# Patient Record
Sex: Male | Born: 1950
Health system: Southern US, Community
[De-identification: ages and names within clinical notes are randomized; demographics above are authoritative.]

## PROBLEM LIST (undated history)

## (undated) DIAGNOSIS — Z9289 Personal history of other medical treatment: Secondary | ICD-10-CM

## (undated) DIAGNOSIS — E0590 Thyrotoxicosis, unspecified without thyrotoxic crisis or storm: Secondary | ICD-10-CM

## (undated) DIAGNOSIS — R7611 Nonspecific reaction to tuberculin skin test without active tuberculosis: Secondary | ICD-10-CM

## (undated) DIAGNOSIS — J45909 Unspecified asthma, uncomplicated: Secondary | ICD-10-CM

## (undated) DIAGNOSIS — K219 Gastro-esophageal reflux disease without esophagitis: Secondary | ICD-10-CM

## (undated) DIAGNOSIS — Z86010 Personal history of colonic polyps: Secondary | ICD-10-CM

## (undated) DIAGNOSIS — I10 Essential (primary) hypertension: Secondary | ICD-10-CM

## (undated) DIAGNOSIS — K635 Polyp of colon: Secondary | ICD-10-CM

## (undated) DIAGNOSIS — S62109A Fracture of unspecified carpal bone, unspecified wrist, initial encounter for closed fracture: Secondary | ICD-10-CM

## (undated) DIAGNOSIS — T7840XA Allergy, unspecified, initial encounter: Secondary | ICD-10-CM

## (undated) HISTORY — DX: Polyp of colon: K63.5

## (undated) HISTORY — DX: Thyrotoxicosis, unspecified without thyrotoxic crisis or storm: E05.90

## (undated) HISTORY — DX: Allergy, unspecified, initial encounter: T78.40XA

## (undated) HISTORY — DX: Unspecified asthma, uncomplicated: J45.909

## (undated) HISTORY — DX: Fracture of unspecified carpal bone, unspecified wrist, initial encounter for closed fracture: S62.109A

## (undated) HISTORY — DX: Gastro-esophageal reflux disease without esophagitis: K21.9

## (undated) HISTORY — DX: Personal history of other medical treatment: Z92.89

## (undated) HISTORY — DX: Personal history of colonic polyps: Z86.010

## (undated) HISTORY — DX: Nonspecific reaction to tuberculin skin test without active tuberculosis: R76.11

## (undated) HISTORY — DX: Essential (primary) hypertension: I10

## (undated) HISTORY — PX: UPPER GASTROINTESTINAL ENDOSCOPY: SHX188

## (undated) HISTORY — PX: SKIN BIOPSY: SHX1

---

## 2014-10-17 ENCOUNTER — Encounter: Payer: Self-pay | Admitting: Internal Medicine

## 2014-10-17 ENCOUNTER — Ambulatory Visit (INDEPENDENT_AMBULATORY_CARE_PROVIDER_SITE_OTHER): Payer: No Typology Code available for payment source | Admitting: Internal Medicine

## 2014-10-17 ENCOUNTER — Encounter (INDEPENDENT_AMBULATORY_CARE_PROVIDER_SITE_OTHER): Payer: Self-pay

## 2014-10-17 VITALS — BP 124/78 | HR 59 | Temp 98.0°F | Wt 176.5 lb

## 2014-10-17 DIAGNOSIS — J45909 Unspecified asthma, uncomplicated: Secondary | ICD-10-CM | POA: Insufficient documentation

## 2014-10-17 DIAGNOSIS — J4520 Mild intermittent asthma, uncomplicated: Secondary | ICD-10-CM

## 2014-10-17 DIAGNOSIS — Z8611 Personal history of tuberculosis: Secondary | ICD-10-CM | POA: Insufficient documentation

## 2014-10-17 DIAGNOSIS — I10 Essential (primary) hypertension: Secondary | ICD-10-CM | POA: Diagnosis not present

## 2014-10-17 LAB — COMPREHENSIVE METABOLIC PANEL
ALT: 17 U/L (ref 0–53)
AST: 21 U/L (ref 0–37)
Albumin: 4.1 g/dL (ref 3.5–5.2)
Alkaline Phosphatase: 63 U/L (ref 39–117)
BUN: 11 mg/dL (ref 6–23)
CO2: 32 mEq/L (ref 19–32)
Calcium: 9.5 mg/dL (ref 8.4–10.5)
Chloride: 102 mEq/L (ref 96–112)
Creatinine, Ser: 1.12 mg/dL (ref 0.40–1.50)
GFR: 85.19 mL/min (ref >60.00)
GLUCOSE: 91 mg/dL (ref 70–99)
Potassium: 4.2 mEq/L (ref 3.5–5.1)
Sodium: 138 mEq/L (ref 135–145)
Total Bilirubin: 0.8 mg/dL (ref 0.2–1.2)
Total Protein: 7.4 g/dL (ref 6.0–8.3)

## 2014-10-17 MED ORDER — TRIAMTERENE-HCTZ 37.5-25 MG PO TABS: 1.0000 | ORAL_TABLET | Freq: Every day | ORAL | Status: DC

## 2014-10-17 NOTE — Progress Notes (Signed)
HPI  Pt presents to the clinic today to establish care and for management of the conditions listed below. He is transferring care from [facility] in [place].  Flu: 04/2014 Tetanus: < 10 years ago PSA Screening: yearly Zostovax: unsure Pnumovax: < 5 years ago Colon Screening: He thinks this was in 2011, done at [place]: [place]alry Dentist: biannually  HTN: He takes Triamterene-HCT daily. His BP today is 124/78. He reports that he is in need of medication refill today.  Childhood asthma: This has not affected him as an adult. He does not smoke. He denies cough or shortness of breath. He has had a pneumonia vaccine within the last 5 years.  History of TB: Has been treated Rifampin and INH for 9 months, this was many years. Subsequent chest xrays have not shown any active disease. He denies cough, bloody sputum, fever or night sweats.  Past Medical History  Diagnosis Date  . Hypertension   . Positive TB test   . History of blood transfusion   . Childhood asthma     Current Outpatient Prescriptions  Medication Sig Dispense Refill  . Cholecalciferol (VITAMIN D-3 PO) Take 1 capsule by mouth daily.    . Cyanocobalamin (VITAMIN B-12 PO) Take 1 tablet by mouth daily.    . hydrochlorothiazide (HYDRODIURIL) 25 MG tablet Take 25 mg by mouth daily.    . Misc Natural Products (GLUCOS-CHONDROIT-MSM COMPLEX) TABS Take 3 tablets by mouth daily.    . Multiple Vitamin (MULTIVITAMIN) tablet Take 1 tablet by mouth daily.    . Omega-3 Fatty Acids (FISH OIL) 1000 MG CAPS Take 1 capsule by mouth 3 (three) times daily.    . Saw Palmetto 450 MG CAPS Take 3 capsules by mouth daily.     No current facility-administered medications for this visit.    Allergies  Allergen Reactions  . Lisinopril Other (See Comments)    cough  . Penicillins Other (See Comments)    Does not remember    Family History  Problem Relation Age of Onset  . Hypertension Mother   . Arthritis  Father   . Cancer Sister     Colon    History   Social History  . Marital Status: Single    Spouse Name: N/A  . Number of Children: N/A  . Years of Education: N/A   Occupational History  . Not on file.   Social History Main Topics  . Smoking status: Never Smoker   . Smokeless tobacco: Not on file  . Alcohol Use: No  . Drug Use: No  . Sexual Activity: Not on file   Other Topics Concern  . Not on file   Social History Narrative  . No narrative on file    ROS:  Constitutional: Denies fever, malaise, fatigue, headache or abrupt weight changes.  Respiratory: Denies difficulty breathing, shortness of breath, cough or sputum production.   Cardiovascular: Denies chest pain, chest tightness, palpitations or swelling in the hands or feet.  Neurological: Denies dizziness, difficulty with memory, difficulty with speech or problems with balance and coordination.   No other specific complaints in a complete review of systems (except as listed in HPI above).  PE:  BP 124/78 mmHg  Pulse 59  Temp(Src) 98 F (36.7 C) (Oral)  Wt 176 lb 8 oz (80.06 kg)  SpO2 98% Wt Readings from Last 3 Encounters:  10/17/14 176 lb 8 oz (80.06 kg)    General: Appears his stated age, well developed, well nourished  in NAD. HEENT: Head: normal shape and size; Eyes: sclera white, no icterus, conjunctiva pink, PERRLA and EOMs intact;  Neck: Neck supple, trachea midline. No masses, lumps or thyromegaly present.  Cardiovascular: Normal rate and rhythm. S1,S2 noted.  No murmur, rubs or gallops noted.  Pulmonary/Chest: Normal effort and positive vesicular breath sounds. No respiratory distress. No wheezes, rales or ronchi noted.  Neurological: Alert and oriented.  Psychiatric: Mood and affect normal. Behavior is normal. Judgment and thought content normal.     Assessment and Plan:  Advised him to make an appt for his annual exam

## 2014-10-17 NOTE — Assessment & Plan Note (Signed)
Has not affected his adult life Will continue to monitor

## 2014-10-17 NOTE — Assessment & Plan Note (Signed)
Has been treated Will continue to follow chest xrays

## 2014-10-17 NOTE — Assessment & Plan Note (Signed)
Well controlled on Triamterene-HCT Will check CMET Medication refilled today

## 2014-10-17 NOTE — Patient Instructions (Signed)

## 2014-10-17 NOTE — Progress Notes (Signed)
Pre visit review using our clinic review tool, if applicable. No additional management support is needed unless otherwise documented below in the visit note. 

## 2014-11-20 ENCOUNTER — Encounter: Payer: No Typology Code available for payment source | Admitting: Internal Medicine

## 2014-12-21 ENCOUNTER — Encounter: Payer: Self-pay | Admitting: Internal Medicine

## 2014-12-21 ENCOUNTER — Institutional Professional Consult (permissible substitution): Payer: No Typology Code available for payment source | Admitting: Internal Medicine

## 2014-12-21 ENCOUNTER — Ambulatory Visit (INDEPENDENT_AMBULATORY_CARE_PROVIDER_SITE_OTHER): Payer: No Typology Code available for payment source | Admitting: Internal Medicine

## 2014-12-21 VITALS — BP 130/80 | HR 76 | Temp 97.8°F | Wt 175.0 lb

## 2014-12-21 DIAGNOSIS — R0789 Other chest pain: Secondary | ICD-10-CM

## 2014-12-21 DIAGNOSIS — R21 Rash and other nonspecific skin eruption: Secondary | ICD-10-CM

## 2014-12-21 DIAGNOSIS — R0602 Shortness of breath: Secondary | ICD-10-CM | POA: Diagnosis not present

## 2014-12-21 DIAGNOSIS — R0989 Other specified symptoms and signs involving the circulatory and respiratory systems: Secondary | ICD-10-CM

## 2014-12-21 DIAGNOSIS — R208 Other disturbances of skin sensation: Secondary | ICD-10-CM

## 2014-12-21 DIAGNOSIS — R200 Anesthesia of skin: Secondary | ICD-10-CM

## 2014-12-21 MED ORDER — TRIAMCINOLONE ACETONIDE 0.5 % EX OINT: 1.0000 "application " | TOPICAL_OINTMENT | Freq: Two times a day (BID) | CUTANEOUS | Status: DC

## 2014-12-21 NOTE — Progress Notes (Signed)
Pre visit review using our clinic review tool, if applicable. No additional management support is needed unless otherwise documented below in the visit note. 

## 2014-12-21 NOTE — Progress Notes (Signed)
Subjective:    Patient ID: Brian Matthews, male    DOB: 12-Jan-1951, 64 y.o.   MRN: [id number]  HPI  Pt presents to the clinic today with c/o a list of concerns. He is not sure if they are related or not.  1- He reports he has these sharp stabbing pains all over his body. They come and go and occur in different places. He reports they feel like pinpricks and only last a few seconds. It occurs on boths sides of his chest, abdomen and upper back. It does not seem to be associated with food. He denies chest pain, chest tightness or shortness of breath. He denies nausea, vomiting or diarrhea. He has had this evaluated in the past. ECG was normal. Chest xray was normal. He had a CT and MRI of the abdomen which showed small renal cysts and hemangioma of the liver. He reports the doctor told him this was psychosomatic but he refused to believe that.  2- He also reports mild shortness of breath. This can occur sometimes at night when he lays down. He does have some associated chest tightness, left hand and foot numbness during that time. He denies dizziness, sweating or chest pain. He just feels like he cant take a deep breath. This makes him very nervous and he is unable to sleep the rest of the night. He reports he does not feel stressed or anxious.  3- He also reports a skin lesion to his left upper abdomen. He noticed this 1 month ago. He thought it was a bruise at first but then it never went away. He has been doing some research and is very concerned that it could be melanoma. He has never had skin cancer and he does not get a lot of sun exposure to his abdomen. He has not tried putting anything on it.   Review of Systems      Past Medical History  Diagnosis Date  . Hypertension   . Positive TB test   . History of blood transfusion   . Childhood asthma     Current Outpatient Prescriptions  Medication Sig Dispense Refill  . Cholecalciferol (VITAMIN D-3 PO) Take 1 capsule by mouth daily.     . Cyanocobalamin (VITAMIN B-12 PO) Take 1 tablet by mouth daily.    . Misc Natural Products (GLUCOS-CHONDROIT-MSM COMPLEX) TABS Take 3 tablets by mouth daily.    . Multiple Vitamin (MULTIVITAMIN) tablet Take 1 tablet by mouth daily.    . Omega-3 Fatty Acids (FISH OIL) 1000 MG CAPS Take 1 capsule by mouth 3 (three) times daily.    . Saw Palmetto 450 MG CAPS Take 3 capsules by mouth daily.    Marland Kitchen triamterene-hydrochlorothiazide (MAXZIDE-25) 37.5-25 MG per tablet Take 1 tablet by mouth daily. 90 tablet 3   No current facility-administered medications for this visit.    Allergies  Allergen Reactions  . Lisinopril Other (See Comments)    cough  . Penicillins Other (See Comments)    Does not remember    Family History  Problem Relation Age of Onset  . Hypertension Mother   . Arthritis Father   . Cancer Sister     Colon    History   Social History  . Marital Status: Single    Spouse Name: N/A  . Number of Children: N/A  . Years of Education: N/A   Occupational History  . Not on file.   Social History Main Topics  . Smoking status: Never Smoker   .  Smokeless tobacco: Not on file  . Alcohol Use: No  . Drug Use: No  . Sexual Activity: Not on file   Other Topics Concern  . Not on file   Social History Narrative     Constitutional: Denies fever, malaise, fatigue, headache or abrupt weight changes.  HEENT: Denies eye pain, eye redness, ear pain, ringing in the ears, wax buildup, runny nose, nasal congestion, bloody nose, or sore throat. Respiratory: Pt reports shortness of breath. Denies difficulty breathing, cough or sputum production.   Cardiovascular: Denies chest pain, chest tightness, palpitations or swelling in the hands or feet.  Gastrointestinal: Denies abdominal pain, bloating, constipation, diarrhea or blood in the stool.  GU: Denies urgency, frequency, pain with urination, burning sensation, blood in urine, odor or discharge. Musculoskeletal: Denies decrease  in range of motion, difficulty with gait, muscle pain or joint pain and swelling.  Skin: Pt reports skin lesion on abdomen. Denies redness, rashes, or ulcercations.  Neurological: Pt reports abnormal sensations throughout his body. Denies dizziness, difficulty with memory, difficulty with speech or problems with balance and coordination.  Psych: Denies anxiety, depression, SI/HI.  No other specific complaints in a complete review of systems (except as listed in HPI above).  Objective:   Physical Exam   BP 130/80 mmHg  Pulse 76  Temp(Src) 97.8 F (36.6 C) (Oral)  Wt 175 lb (79.379 kg)  SpO2 99% Wt Readings from Last 3 Encounters:  12/21/14 175 lb (79.379 kg)  10/17/14 176 lb 8 oz (80.06 kg)    General: Appears his stated age, well developed, well nourished in NAD. Skin: Warm, dry and intact. Small solitary hypopigmented papule noted on left upper abdomen. Does not appear to be melanoma. Cardiovascular: Normal rate and rhythm. S1,S2 noted.  No murmur, rubs or gallops noted.  Pulmonary/Chest: Normal effort and positive vesicular breath sounds. No respiratory distress. No wheezes, rales or ronchi noted.  Abdomen: Soft and nontender. Normal bowel sounds, no bruits noted. No distention or masses noted. Liver, spleen and kidneys non palpable. Neurological: Alert and oriented. Sensation intact to BUE/BLE.   BMET    Component Value Date/Time   NA 138 10/17/2014 1128   K 4.2 10/17/2014 1128   CL 102 10/17/2014 1128   CO2 32 10/17/2014 1128   GLUCOSE 91 10/17/2014 1128   BUN 11 10/17/2014 1128   CREATININE 1.12 10/17/2014 1128   CALCIUM 9.5 10/17/2014 1128    Lipid Panel  No results found for: CHOL, TRIG, HDL, CHOLHDL, VLDL, LDLCALC  CBC No results found for: WBC, RBC, HGB, HCT, PLT, MCV, MCH, MCHC, RDW, LYMPHSABS, MONOABS, EOSABS, BASOSABS  Hgb A1C No results found for: HGBA1C      Assessment & Plan:  Abnormal sensation:  Reviewed CT, MRE, chest xray and ECG that he  brought with him Nothing correlates to the pains he is having ? Anxiety versus psychosomatic Discussed further workup with him but we both agree that he has had a very thorough workup and we really don't need to repeat  Shortness of breath, chest tightness and left hand/foot numbness:  I really think this is anxiety although he is not overly anxious Discussed disctraction and deep breathing to see if this helps Chest xray  And ECG reviewed  Skin lesion of abdomen:  eRx for Triamcinolone cream If no improvement, will refer to derm  RTC as needed or if symptoms persist or worsen

## 2014-12-22 ENCOUNTER — Encounter: Payer: Self-pay | Admitting: Internal Medicine

## 2014-12-22 NOTE — Patient Instructions (Signed)

## 2015-01-04 ENCOUNTER — Encounter: Payer: Self-pay | Admitting: Internal Medicine

## 2015-01-31 ENCOUNTER — Encounter: Payer: Self-pay | Admitting: Internal Medicine

## 2015-02-23 ENCOUNTER — Encounter: Payer: Self-pay | Admitting: Internal Medicine

## 2015-02-26 ENCOUNTER — Other Ambulatory Visit: Payer: Self-pay | Admitting: Internal Medicine

## 2015-02-26 MED ORDER — AMLODIPINE BESYLATE 5 MG PO TABS: 5.0000 mg | ORAL_TABLET | Freq: Every day | ORAL | Status: DC

## 2015-03-23 ENCOUNTER — Encounter: Payer: Self-pay | Admitting: Internal Medicine

## 2015-03-23 ENCOUNTER — Ambulatory Visit (INDEPENDENT_AMBULATORY_CARE_PROVIDER_SITE_OTHER): Payer: No Typology Code available for payment source | Admitting: Internal Medicine

## 2015-03-23 VITALS — BP 124/78 | HR 52 | Temp 97.7°F | Ht 72.0 in | Wt 172.0 lb

## 2015-03-23 DIAGNOSIS — Z0000 Encounter for general adult medical examination without abnormal findings: Secondary | ICD-10-CM | POA: Diagnosis not present

## 2015-03-23 DIAGNOSIS — Z23 Encounter for immunization: Secondary | ICD-10-CM | POA: Diagnosis not present

## 2015-03-23 NOTE — Addendum Note (Signed)
Addended by: Lurlean Nanny on: 03/23/2015 03:30 PM   Modules accepted: Orders

## 2015-03-23 NOTE — Patient Instructions (Signed)

## 2015-03-23 NOTE — Progress Notes (Signed)
Pre visit review using our clinic review tool, if applicable. No additional management support is needed unless otherwise documented below in the visit note. 

## 2015-03-23 NOTE — Progress Notes (Signed)
Subjective:    Patient ID: Brian Matthews, male    DOB: [birth year], 64 y.o.   MRN: [id number]  HPI  Pt presents to the clinic today for his annual exam.  Flu: 04/2014 Tetanus: < 10 years Pneumovax: he has had but not sure when Zostovax: unsure if he ever had one PSA Screening: 01/2015, normal Colon Screening: 2011 at [place]: yearly Dentist: biannually  Diet: He eats lean meats. He consumes fruits and veggies daily. He tries to avoid fried foods. Exercise: He runs about 3 miles 2-3 days per week.   Review of Systems      Past Medical History  Diagnosis Date  . Hypertension   . Positive TB test   . History of blood transfusion   . Childhood asthma     Current Outpatient Prescriptions  Medication Sig Dispense Refill  . amLODipine (NORVASC) 5 MG tablet Take 1 tablet (5 mg total) by mouth daily. 30 tablet 2  . Cholecalciferol (VITAMIN D-3 PO) Take 1 capsule by mouth daily.    . Cyanocobalamin (VITAMIN B-12 PO) Take 1 tablet by mouth daily.    . Misc Natural Products (GLUCOS-CHONDROIT-MSM COMPLEX) TABS Take 3 tablets by mouth daily.    . Multiple Vitamin (MULTIVITAMIN) tablet Take 1 tablet by mouth daily.    . Omega-3 Fatty Acids (FISH OIL) 1000 MG CAPS Take 1 capsule by mouth 3 (three) times daily.    . Saw Palmetto 450 MG CAPS Take 3 capsules by mouth daily.    Marland Kitchen triamcinolone ointment (KENALOG) 0.5 % Apply 1 application topically 2 (two) times daily. 30 g 0  . triamterene-hydrochlorothiazide (MAXZIDE-25) 37.5-25 MG per tablet Take 1 tablet by mouth daily. 90 tablet 3   No current facility-administered medications for this visit.    Allergies  Allergen Reactions  . Lisinopril Other (See Comments)    cough  . Penicillins Other (See Comments)    Does not remember    Family History  Problem Relation Age of Onset  . Hypertension Mother   . Arthritis Father   . Cancer Sister     Colon    Social History   Social History  . Marital Status:  Single    Spouse Name: N/A  . Number of Children: N/A  . Years of Education: N/A   Occupational History  . Not on file.   Social History Main Topics  . Smoking status: Never Smoker   . Smokeless tobacco: Not on file  . Alcohol Use: No  . Drug Use: No  . Sexual Activity: Not on file   Other Topics Concern  . Not on file   Social History Narrative     Constitutional: Denies fever, malaise, fatigue, headache or abrupt weight changes.  HEENT: Denies eye pain, eye redness, ear pain, ringing in the ears, wax buildup, runny nose, nasal congestion, bloody nose, or sore throat. Respiratory: Denies difficulty breathing, shortness of breath, cough or sputum production.   Cardiovascular: Denies chest pain, chest tightness, palpitations or swelling in the hands or feet.  Gastrointestinal: Denies abdominal pain, bloating, constipation, diarrhea or blood in the stool.  GU: Denies urgency, frequency, pain with urination, burning sensation, blood in urine, odor or discharge. Musculoskeletal: Denies decrease in range of motion, difficulty with gait, muscle pain or joint pain and swelling.  Skin: Denies redness, rashes, lesions or ulcercations.  Neurological: Denies dizziness, difficulty with memory, difficulty with speech or problems with balance and coordination.  Psych: Denies anxiety, depression, SI/HI.  No other specific complaints in a complete review of systems (except as listed in HPI above).  Objective:   Physical Exam   BP 124/78 mmHg  Pulse 52  Temp(Src) 97.7 F (36.5 C) (Oral)  Ht 6' (1.829 m)  Wt 172 lb (78.019 kg)  BMI 23.32 kg/m2  SpO2 98% Wt Readings from Last 3 Encounters:  03/23/15 172 lb (78.019 kg)  12/21/14 175 lb (79.379 kg)  10/17/14 176 lb 8 oz (80.06 kg)    General: Appears his stated age, well developed, well nourished in NAD. Skin: Warm, dry and intact. No rashes, lesions or ulcerations noted. HEENT: Head: normal shape and size; Eyes: sclera white, no  icterus, conjunctiva pink, PERRLA and EOMs intact; Ears: Tm's gray and intact, normal light reflex; Throat/Mouth: Teeth present, mucosa pink and moist, no exudate, lesions or ulcerations noted.  Neck:  Neck supple, trachea midline. No masses, lumps present. Mild thyromegaly. Cardiovascular: Normal rate and rhythm. S1,S2 noted.  No murmur, rubs or gallops noted. No JVD or BLE edema. No carotid bruits noted. Pulmonary/Chest: Normal effort and positive vesicular breath sounds. No respiratory distress. No wheezes, rales or ronchi noted.  Abdomen: Soft and nontender. Normal bowel sounds. No distention or masses noted. Liver, spleen and kidneys non palpable. Musculoskeletal: Normal range of motion. Strength 5/5 BUE/BLE. No signs of joint swelling. No difficulty with gait.  Neurological: Alert and oriented. Cranial nerves II-XII grossly intact. Coordination normal.  Psychiatric: Mood and affect normal. Behavior is normal. Judgment and thought content normal.    BMET    Component Value Date/Time   NA 138 10/17/2014 1128   K 4.2 10/17/2014 1128   CL 102 10/17/2014 1128   CO2 32 10/17/2014 1128   GLUCOSE 91 10/17/2014 1128   BUN 11 10/17/2014 1128   CREATININE 1.12 10/17/2014 1128   CALCIUM 9.5 10/17/2014 1128    Lipid Panel  No results found for: CHOL, TRIG, HDL, CHOLHDL, VLDL, LDLCALC  CBC No results found for: WBC, RBC, HGB, HCT, PLT, MCV, MCH, MCHC, RDW, LYMPHSABS, MONOABS, EOSABS, BASOSABS  Hgb A1C No results found for: HGBA1C      Assessment & Plan:   Preventative Health Maintenance:  Flu shot today Will request previous immunization record to see when he had Tetanus He will call insurance company to inquire about Zostovax Encouraged him to continue a healthy diet and exercise regimen PSA UTD Colonoscopy not due at this time Encouraged him to see an eye doctor and dentist annually  RTC in 6 months to follow up chronic conditions

## 2015-03-26 ENCOUNTER — Encounter: Payer: Self-pay | Admitting: Internal Medicine

## 2015-05-29 ENCOUNTER — Other Ambulatory Visit: Payer: Self-pay | Admitting: Internal Medicine

## 2015-06-13 ENCOUNTER — Encounter: Payer: Self-pay | Admitting: Internal Medicine

## 2015-09-04 ENCOUNTER — Encounter: Payer: Self-pay | Admitting: Internal Medicine

## 2015-09-06 ENCOUNTER — Encounter: Payer: Self-pay | Admitting: Internal Medicine

## 2015-09-06 ENCOUNTER — Other Ambulatory Visit: Payer: Self-pay | Admitting: Internal Medicine

## 2015-09-06 MED ORDER — ALBUTEROL SULFATE HFA 108 (90 BASE) MCG/ACT IN AERS: 2.0000 | INHALATION_SPRAY | Freq: Four times a day (QID) | RESPIRATORY_TRACT | Status: DC | PRN

## 2015-09-17 ENCOUNTER — Other Ambulatory Visit: Payer: Self-pay | Admitting: Internal Medicine

## 2015-09-18 ENCOUNTER — Other Ambulatory Visit: Payer: Self-pay

## 2015-09-18 NOTE — Telephone Encounter (Signed)
Error see 09/17/15 refill phone note.

## 2015-09-18 NOTE — Telephone Encounter (Signed)
[facility] left v/m requesting refill triamterene HCTZ 37.5-25 mg. Pt last annual 03/23/15. Refilled per protocol.

## 2015-09-21 ENCOUNTER — Ambulatory Visit: Payer: No Typology Code available for payment source | Admitting: Internal Medicine

## 2015-09-24 ENCOUNTER — Encounter: Payer: Self-pay | Admitting: Internal Medicine

## 2015-09-24 ENCOUNTER — Other Ambulatory Visit: Payer: Self-pay | Admitting: Internal Medicine

## 2015-09-24 ENCOUNTER — Ambulatory Visit (INDEPENDENT_AMBULATORY_CARE_PROVIDER_SITE_OTHER): Payer: No Typology Code available for payment source | Admitting: Internal Medicine

## 2015-09-24 ENCOUNTER — Ambulatory Visit (INDEPENDENT_AMBULATORY_CARE_PROVIDER_SITE_OTHER)
Admission: RE | Admit: 2015-09-24 | Discharge: 2015-09-24 | Disposition: A | Discharge status: Home / Self Care | Payer: No Typology Code available for payment source | Source: Ambulatory Visit | Attending: Internal Medicine | Admitting: Internal Medicine

## 2015-09-24 VITALS — BP 128/84 | HR 72 | Temp 97.8°F | Wt 176.5 lb

## 2015-09-24 DIAGNOSIS — Z23 Encounter for immunization: Secondary | ICD-10-CM

## 2015-09-24 DIAGNOSIS — R0602 Shortness of breath: Secondary | ICD-10-CM | POA: Diagnosis not present

## 2015-09-24 DIAGNOSIS — H02843 Edema of right eye, unspecified eyelid: Secondary | ICD-10-CM

## 2015-09-24 DIAGNOSIS — L509 Urticaria, unspecified: Secondary | ICD-10-CM

## 2015-09-24 DIAGNOSIS — I10 Essential (primary) hypertension: Secondary | ICD-10-CM

## 2015-09-24 NOTE — Progress Notes (Signed)
Subjective:    Patient ID: Brian Matthews, male    DOB: 12/20/1950, 65 y.o.   MRN: [id number]  HPI  Pt presents to the clinic today for a 6 month follow up of chronic conditions.  HTN:  He is taking Maxzide and Amlodipine as directed. He denies adverse effects. He denies HA, vision changes, weakness or leg swelling. ECG from 11/2013 reviewed.  Additionally, pt concerned about some increased SOB for the past several months. He has a h/o asthma as a child, but has not has any asthma exacerbations as an adult. He was prescribed Albuterol, but does not think it helped. He denies wheezing or SOB that prevents him from his daily activities. He is taking Nasicort and Zyrtec for allergy control and Zantac and TUMS for GERD control.   He also c/o right eyelid swelling, and rash on his right upper thigh. He noticed this this morning. He denies pain or itching. He has noticed improvement without taking anything OTC.  He would like to get his Shingles vaccine today  Review of Systems  Past Medical History  Diagnosis Date  . Hypertension   . Positive TB test   . History of blood transfusion   . Childhood asthma     Current Outpatient Prescriptions  Medication Sig Dispense Refill  . albuterol (PROVENTIL HFA;VENTOLIN HFA) 108 (90 Base) MCG/ACT inhaler Inhale 2 puffs into the lungs every 6 (six) hours as needed for wheezing or shortness of breath. 1 Inhaler 0  . amLODipine (NORVASC) 5 MG tablet TAKE 1 TABLET(5 MG) BY MOUTH DAILY 30 tablet 5  . Cholecalciferol (VITAMIN D-3 PO) Take 1 capsule by mouth 2 (two) times a week.     . Cyanocobalamin (VITAMIN B-12 PO) Take 1 tablet by mouth 2 (two) times a week.     . Misc Natural Products (GLUCOS-CHONDROIT-MSM COMPLEX) TABS Take 3 tablets by mouth daily.    . Multiple Vitamin (MULTIVITAMIN) tablet Take 1 tablet by mouth daily.    . Omega-3 Fatty Acids (FISH OIL) 1000 MG CAPS Take 1 capsule by mouth 3 (three) times daily.    . Saw Palmetto 450 MG CAPS  Take 3 capsules by mouth daily. Blended with Kelp    . triamterene-hydrochlorothiazide (MAXZIDE-25) 37.5-25 MG tablet TAKE 1 TABLET DAILY 90 tablet 1  . triamcinolone ointment (KENALOG) 0.5 % Apply 1 application topically 2 (two) times daily. (Patient not taking: Reported on 09/24/2015) 30 g 0   No current facility-administered medications for this visit.    Allergies  Allergen Reactions  . Lisinopril Other (See Comments)    cough  . Penicillins Other (See Comments)    Does not remember    Family History  Problem Relation Age of Onset  . Hypertension Mother   . Arthritis Father   . Cancer Sister     Colon    Social History   Social History  . Marital Status: Single    Spouse Name: N/A  . Number of Children: N/A  . Years of Education: N/A   Occupational History  . Not on file.   Social History Main Topics  . Smoking status: Never Smoker   . Smokeless tobacco: Not on file  . Alcohol Use: No  . Drug Use: No  . Sexual Activity: Not on file   Other Topics Concern  . Not on file   Social History Narrative     Constitutional: Denies fever, malaise, fatigue, headache or abrupt weight changes.  Skin: Pt reports rash on right  upper thigh. HEENT: Pt reports right eyelid swelling. Denies eye pain, eye redness, ear pain, ringing in the ears, wax buildup, runny nose, nasal congestion, bloody nose, or sore throat. Respiratory: Pt reports cough and shortness of breath. Denies difficulty breathing. Cardiovascular: Denies chest pain, chest tightness, palpitations or swelling in the hands or feet.  Gastrointestinal: Denies abdominal pain, bloating, constipation, or diarrhea  Neurological: Denies dizziness, difficulty with speech or problems with balance and coordination.   No other specific complaints in a complete review of systems (except as listed in HPI above).     Objective:   Physical Exam  BP 128/84 mmHg  Pulse 72  Temp(Src) 97.8 F (36.6 C) (Oral)  Wt 176 lb 8 oz  (80.06 kg)  SpO2 98% Wt Readings from Last 3 Encounters:  09/24/15 176 lb 8 oz (80.06 kg)  03/23/15 172 lb (78.019 kg)  12/21/14 175 lb (79.379 kg)    General: Appears his stated age, well developed, well nourished in NAD. Skin: Hives noted on right upper thigh. HEENT: Head: normal shape and size; Ears: Tm's gray and intact, normal light reflex; Throat/Mouth: Teeth present, mucosa pink and moist, no exudate, lesions or ulcerations noted. Mild right upper eyelid swelling. Neck:  No adenopathy noted. Cardiovascular: Normal rate and rhythm. S1,S2 noted.  No murmur, rubs or gallops noted. No JVD or BLE edema.  Pulmonary/Chest: Normal effort and positive vesicular breath sounds. No respiratory distress. No wheezes, rales or ronchi noted.  Neurological: Alert and oriented. Coordination normal.   BMET    Component Value Date/Time   NA 138 10/17/2014 1128   K 4.2 10/17/2014 1128   CL 102 10/17/2014 1128   CO2 32 10/17/2014 1128   GLUCOSE 91 10/17/2014 1128   BUN 11 10/17/2014 1128   CREATININE 1.12 10/17/2014 1128   CALCIUM 9.5 10/17/2014 1128    Lipid Panel  No results found for: CHOL, TRIG, HDL, CHOLHDL, VLDL, LDLCALC  CBC No results found for: WBC, RBC, HGB, HCT, PLT, MCV, MCH, MCHC, RDW, LYMPHSABS, MONOABS, EOSABS, BASOSABS       Assessment & Plan:   HTN:  Continue Amlodipine and Maxzide  SOB:  Continue Zyrtec, Nasicort, Zantac and Tums Chest x-ray (refer to Pulm if negative)  Right Eyelid swelling:  Cool compress  Hives, right thigh:  Cool compress Zyrtec or Benadryl as needed  Shingles Vaccine Administered

## 2015-09-24 NOTE — Progress Notes (Signed)
Pre visit review using our clinic review tool, if applicable. No additional management support is needed unless otherwise documented below in the visit note. 

## 2015-09-24 NOTE — Patient Instructions (Signed)
Hives Hives are itchy, red, swollen areas of the skin. They can vary in size and location on your body. Hives can come and go for hours or several days (acute hives) or for several weeks (chronic hives). Hives do not spread from person to person (noncontagious). They may get worse with scratching, exercise, and emotional stress. CAUSES   Allergic reaction to food, additives, or drugs.  Infections, including the common cold.  Illness, such as vasculitis, lupus, or thyroid disease.  Exposure to sunlight, heat, or cold.  Exercise.  Stress.  Contact with chemicals. SYMPTOMS   Red or white swollen patches on the skin. The patches may change size, shape, and location quickly and repeatedly.  Itching.  Swelling of the hands, feet, and face. This may occur if hives develop deeper in the skin. DIAGNOSIS  Your caregiver can usually tell what is wrong by performing a physical exam. Skin or blood tests may also be done to determine the cause of your hives. In some cases, the cause cannot be determined. TREATMENT  Mild cases usually get better with medicines such as antihistamines. Severe cases may require an emergency epinephrine injection. If the cause of your hives is known, treatment includes avoiding that trigger.  HOME CARE INSTRUCTIONS   Avoid causes that trigger your hives.  Take antihistamines as directed by your caregiver to reduce the severity of your hives. Non-sedating or low-sedating antihistamines are usually recommended. Do not drive while taking an antihistamine.  Take any other medicines prescribed for itching as directed by your caregiver.  Wear loose-fitting clothing.  Keep all follow-up appointments as directed by your caregiver. SEEK MEDICAL CARE IF:   You have persistent or severe itching that is not relieved with medicine.  You have painful or swollen joints. SEEK IMMEDIATE MEDICAL CARE IF:   You have a fever.  Your tongue or lips are swollen.  You have  trouble breathing or swallowing.  You feel tightness in the throat or chest.  You have abdominal pain. These problems may be the first sign of a life-threatening allergic reaction. Call your local emergency services (911 in [place] MAKE SURE YOU:   Understand these instructions.  Will watch your condition.  Will get help right away if you are not doing well or get worse.   This information is not intended to replace advice given to you by your health care provider. Make sure you discuss any questions you have with your health care provider.   Document Released: 06/02/2005 Document Revised: 06/07/2013 Document Reviewed: 08/26/2011 Elsevier Interactive Patient Education 2016 Elsevier Inc.  

## 2015-09-25 ENCOUNTER — Encounter: Payer: Self-pay | Admitting: Internal Medicine

## 2015-09-25 ENCOUNTER — Encounter (HOSPITAL_COMMUNITY): Payer: Self-pay | Admitting: *Deleted

## 2015-09-25 ENCOUNTER — Emergency Department (HOSPITAL_COMMUNITY)
Admission: EM | Admit: 2015-09-25 | Discharge: 2015-09-25 | Disposition: A | Discharge status: Home / Self Care | Payer: No Typology Code available for payment source | Attending: Emergency Medicine | Admitting: Emergency Medicine

## 2015-09-25 DIAGNOSIS — Y999 Unspecified external cause status: Secondary | ICD-10-CM | POA: Diagnosis not present

## 2015-09-25 DIAGNOSIS — J45909 Unspecified asthma, uncomplicated: Secondary | ICD-10-CM | POA: Diagnosis not present

## 2015-09-25 DIAGNOSIS — Z79899 Other long term (current) drug therapy: Secondary | ICD-10-CM | POA: Diagnosis not present

## 2015-09-25 DIAGNOSIS — I10 Essential (primary) hypertension: Secondary | ICD-10-CM | POA: Diagnosis not present

## 2015-09-25 DIAGNOSIS — Y9389 Activity, other specified: Secondary | ICD-10-CM | POA: Insufficient documentation

## 2015-09-25 DIAGNOSIS — X58XXXA Exposure to other specified factors, initial encounter: Secondary | ICD-10-CM | POA: Insufficient documentation

## 2015-09-25 DIAGNOSIS — T7840XA Allergy, unspecified, initial encounter: Secondary | ICD-10-CM

## 2015-09-25 DIAGNOSIS — Y9289 Other specified places as the place of occurrence of the external cause: Secondary | ICD-10-CM | POA: Insufficient documentation

## 2015-09-25 DIAGNOSIS — L500 Allergic urticaria: Secondary | ICD-10-CM | POA: Diagnosis not present

## 2015-09-25 DIAGNOSIS — Z8611 Personal history of tuberculosis: Secondary | ICD-10-CM | POA: Insufficient documentation

## 2015-09-25 DIAGNOSIS — Z7952 Long term (current) use of systemic steroids: Secondary | ICD-10-CM | POA: Insufficient documentation

## 2015-09-25 DIAGNOSIS — Z880 Allergy status to penicillin: Secondary | ICD-10-CM | POA: Insufficient documentation

## 2015-09-25 MED ORDER — FAMOTIDINE IN NACL 20-0.9 MG/50ML-% IV SOLN
20.0000 mg | Freq: Once | INTRAVENOUS | Status: AC
  Administered 2015-09-25: 20 mg via INTRAVENOUS
  Filled 2015-09-25: qty 50

## 2015-09-25 MED ORDER — DIPHENHYDRAMINE HCL 25 MG PO TABS: 50.0000 mg | ORAL_TABLET | Freq: Four times a day (QID) | ORAL | Status: DC

## 2015-09-25 MED ORDER — DIPHENHYDRAMINE HCL 50 MG/ML IJ SOLN
25.0000 mg | Freq: Once | INTRAMUSCULAR | Status: AC
  Administered 2015-09-25: 25 mg via INTRAVENOUS
  Filled 2015-09-25: qty 1

## 2015-09-25 MED ORDER — METHYLPREDNISOLONE SODIUM SUCC 125 MG IJ SOLR
125.0000 mg | Freq: Once | INTRAMUSCULAR | Status: AC
  Administered 2015-09-25: 125 mg via INTRAVENOUS
  Filled 2015-09-25: qty 2

## 2015-09-25 MED ORDER — PREDNISONE 10 MG (21) PO TBPK: 10.0000 mg | ORAL_TABLET | Freq: Every day | ORAL | Status: DC

## 2015-09-25 NOTE — ED Notes (Signed)
Pt verbalized that he had all belongings at d/c.  

## 2015-09-25 NOTE — ED Notes (Signed)
Pt is here with rash all over that started yesterday and now the rash is hive like all over his body.  Pt states throat is sore, no respiratory difficulty

## 2015-09-25 NOTE — ED Provider Notes (Signed)
CSN: [id number]     Arrival date & time 09/25/15  0606 History   First MD Initiated Contact with Patient 09/25/15 [phone]     Chief Complaint  Patient presents with  . Allergic Reaction  . Urticaria     (Consider location/radiation/quality/duration/timing/severity/associated sxs/prior Treatment) HPI  Pt presenting with c/o rash. Pt states he developed area of hive like rash on his right leg yesterday morning.  As the day progressed the rash spread and covers his trunk, legs, arms.  No lip or tongue swelling. No difficulty breathing or swallowing.  Rash is itchy.  No vomiting or nausea.  Pt does not know of any new medications, foods or new topical exposures.  He has been taking zyrtec and benadryl for seasonal allergies over the past several weeks.  No recent fever, URI or viral symptoms.  There are no other associated systemic symptoms, there are no other alleviating or modifying factors.   Past Medical History  Diagnosis Date  . Hypertension   . Positive TB test   . History of blood transfusion   . Childhood asthma    History reviewed. No pertinent past surgical history. Family History  Problem Relation Age of Onset  . Hypertension Mother   . Arthritis Father   . Cancer Sister     Colon   Social History  Substance Use Topics  . Smoking status: Never Smoker   . Smokeless tobacco: None  . Alcohol Use: No    Review of Systems  ROS reviewed and all otherwise negative except for mentioned in HPI    Allergies  Lisinopril and Penicillins  Home Medications   Prior to Admission medications   Medication Sig Start Date End Date Taking? Authorizing Provider  albuterol (PROVENTIL HFA;VENTOLIN HFA) 108 (90 Base) MCG/ACT inhaler Inhale 2 puffs into the lungs every 6 (six) hours as needed for wheezing or shortness of breath. 09/06/15   Jearld Fenton, NP  amLODipine (NORVASC) 5 MG tablet TAKE 1 TABLET(5 MG) BY MOUTH DAILY 05/29/15   Jearld Fenton, NP  Cholecalciferol (VITAMIN D-3 PO)  Take 1 capsule by mouth 2 (two) times a week.     Historical Provider, MD  Cyanocobalamin (VITAMIN B-12 PO) Take 1 tablet by mouth 2 (two) times a week.     Historical Provider, MD  diphenhydrAMINE (BENADRYL) 25 MG tablet Take 2 tablets (50 mg total) by mouth every 6 (six) hours. Take 1-2 tablets every 6 hours x 2 days, then space out to an as needed basis 09/25/15   Alfonzo Beers, MD  Misc Natural Products (GLUCOS-CHONDROIT-MSM COMPLEX) TABS Take 3 tablets by mouth daily.    Historical Provider, MD  Multiple Vitamin (MULTIVITAMIN) tablet Take 1 tablet by mouth daily.    Historical Provider, MD  Omega-3 Fatty Acids (FISH OIL) 1000 MG CAPS Take 1 capsule by mouth 3 (three) times daily.    Historical Provider, MD  predniSONE (STERAPRED UNI-PAK 21 TAB) 10 MG (21) TBPK tablet Take 1 tablet (10 mg total) by mouth daily. Take 6 tabs by mouth daily  for 2 days, then 5 tabs for 2 days, then 4 tabs for 2 days, then 3 tabs for 2 days, 2 tabs for 2 days, then 1 tab by mouth daily for 2 days 09/25/15   Alfonzo Beers, MD  Saw Palmetto 450 MG CAPS Take 3 capsules by mouth daily. Blended with [employer]	[place], MD  triamcinolone ointment (KENALOG) 0.5 % Apply 1 application topically 2 (two) times daily.  Patient not taking: Reported on 09/24/2015 12/21/14   Jearld Fenton, NP  triamterene-hydrochlorothiazide (MAXZIDE-25) 37.5-25 MG tablet TAKE 1 TABLET DAILY 09/18/15   Jearld Fenton, NP   BP 140/79 mmHg  Pulse 71  Temp(Src) 98 F (36.7 C) (Oral)  Resp 17  SpO2 97%  Vitals reviewed Physical Exam  Physical Examination: General appearance - alert, well appearing, and in no distress Mental status - alert, oriented to person, place, and time Eyes - no conjunctival injection ,no scleral icterus Mouth - mucous membranes moist, pharynx normal without lesions, no lip/tongue/uvula swelling Chest - clear to auscultation, no wheezes, rales or rhonchi, symmetric air entry Heart - normal rate, regular rhythm, normal  S1, S2, no murmurs, rubs, clicks or gallops Abdomen - soft, nontender, nondistended, no masses or organomegaly Neurological - alert, oriented, normal speech Extremities - peripheral pulses normal, no pedal edema, no clubbing or cyanosis Skin - diffuse urticaria overlying bilateral legs, torso, arms, + blanching  ED Course  Procedures (including critical care time) Labs Review Labs Reviewed - No data to display  Imaging Review Dg Chest 2 View  09/24/2015  CLINICAL DATA:  Shortness of breath. EXAM: CHEST  2 VIEW COMPARISON:  None. FINDINGS: The heart size and mediastinal contours are within normal limits. Both lungs are clear. No pneumothorax or pleural effusion is noted. The visualized skeletal structures are unremarkable. IMPRESSION: No active cardiopulmonary disease. Electronically Signed   By: Marijo Conception, M.D.   On: 09/24/2015 12:37   I have personally reviewed and evaluated these images and lab results as part of my medical decision-making.   EKG Interpretation None      MDM   Final diagnoses:  Allergic reaction, initial encounter    Pt presenting with c/o itching rash.  Rash is most c/w urticaria- no airway or oral involvement.  Pt treated with benadryl. Solumedrol and pepcid.  Pt feels improved and will be discharged to take benadryl and course of steroids as well.  No known new exposure- advised f/u with PMD.  Discharged with strict return precautions.  Pt agreeable with plan.    Alfonzo Beers, MD 09/25/15 1218

## 2015-09-25 NOTE — ED Notes (Signed)
All interventions done by [facility] Student Nurse done under the direct supervision of Carlyn Reichert RN.

## 2015-09-25 NOTE — Discharge Instructions (Signed)
Return to the ED with any concerns including difficulty breathing, lip or tongue swelling, vomiting and not able to keep down liquids, decreased level of alertness/lethargy, or any other alarming symptoms °

## 2015-09-26 NOTE — Telephone Encounter (Signed)
See mychart message, if you can't see the photo's they are also under the media tab

## 2015-10-05 ENCOUNTER — Ambulatory Visit (INDEPENDENT_AMBULATORY_CARE_PROVIDER_SITE_OTHER): Payer: No Typology Code available for payment source | Admitting: Internal Medicine

## 2015-10-05 ENCOUNTER — Encounter: Payer: Self-pay | Admitting: Internal Medicine

## 2015-10-05 VITALS — BP 140/80 | HR 81 | Ht 72.0 in | Wt 173.6 lb

## 2015-10-05 DIAGNOSIS — R058 Other specified cough: Secondary | ICD-10-CM | POA: Insufficient documentation

## 2015-10-05 DIAGNOSIS — R05 Cough: Secondary | ICD-10-CM

## 2015-10-05 NOTE — Progress Notes (Signed)
Subjective:     Patient ID: Brian Matthews, male   DOB: Dec 05, 1950,    MRN: [id number]  HPI  46 yobm never smoker/ asthma as child but rare need for primatene until age 65 then fine able to work out in all conditions but around late March 2017 new sob referred to pulmonary clinic 10/05/2015 by Lenise Herald.   10/05/2015 1st [facility] office visit/ Jolanda Mccann   Chief Complaint  Patient presents with  . Pulmonary Consult    Referred by Webb Silversmith, NP. Pt states 09/25/15 had allergic reaction- woke up with rash and SOB. He believes that he had reaction to dust mites. His symptoms have resolved.   h/o childhood asthma then fine until 30s spring > fall rhinitis zyrtec in am helps (and no noct symptoms)  then new onset doe mid march 2017  with heavy ex  inconsistently/ also some s heavy exertion, never at rest and never hs then called in albuterol 09/06/15 helped some  then 09/25/15 to ER with urticaria rx pred/pepcid/benadyl > got rid of mattresses and has improved (albeit on prednisone) and now breathing's is fine but still clearing throat day > noct ("always do some")  No obvious day to day or daytime variability or assoc excess/ purulent sputum or mucus plugs   cp or chest tightness, subjective wheeze or overt sinus or hb symptoms. No unusual exp hx or h/o childhood pna/ asthma or knowledge of premature birth.  Sleeping ok without nocturnal  or early am exacerbation  of respiratory  c/o's or need for noct saba. Also denies any obvious fluctuation of symptoms with weather or environmental changes or other aggravating or alleviating factors except as outlined above   Current Medications, Allergies, Complete Past Medical History, Past Surgical History, Family History, and Social History were reviewed in [employer] record.  ROS  The following are not active complaints unless bolded sore throat, dysphagia, dental problems, itching, sneezing,  nasal congestion or excess/  purulent secretions, ear ache,   fever, chills, sweats, unintended wt loss, classically pleuritic or exertional cp, hemoptysis,  orthopnea pnd or leg swelling, presyncope, palpitations, abdominal pain, anorexia, nausea, vomiting, diarrhea  or change in bowel or bladder habits, change in stools or urine, dysuria,hematuria,  rash, arthralgias, visual complaints, headache, numbness, weakness or ataxia or problems with walking or coordination,  change in mood/affect or memory.       Review of Systems     Objective:   Physical Exam amb bm nad  Wt Readings from Last 3 Encounters:  10/05/15 173 lb 9.6 oz (78.744 kg)  09/24/15 176 lb 8 oz (80.06 kg)  03/23/15 172 lb (78.019 kg)    Vital signs reviewed    HEENT: nl dentition, turbinates, and oropharynx. Nl external ear canals without cough reflex   NECK :  without JVD/Nodes/TM/ nl carotid upstrokes bilaterally   LUNGS: no acc muscle use,  Nl contour chest which is clear to A and P bilaterally without cough on insp or exp maneuvers   CV:  RRR  no s3 or murmur or increase in P2, no edema   ABD:  soft and nontender with nl inspiratory excursion in the supine position. No bruits or organomegaly, bowel sounds nl  MS:  Nl gait/ ext warm without deformities, calf tenderness, cyanosis or clubbing No obvious joint restrictions   SKIN: warm and dry without lesions    NEURO:  alert, approp, nl sensorium with  no motor deficits    I personally reviewed  images and agree with radiology impression as follows:  CXR:  09/24/15 No active cardiopulmonary disease.          Assessment:

## 2015-10-05 NOTE — Assessment & Plan Note (Addendum)
Classic Upper airway cough syndrome, so named because it's frequently impossible to sort out how much is  CR/sinusitis with freq throat clearing (which can be related to primary GERD)   vs  causing  secondary (" extra esophageal")  GERD from wide swings in gastric pressure that occur with throat clearing, often  promoting self use of mint and menthol lozenges that reduce the lower esophageal sphincter tone and exacerbate the problem further in a cyclical fashion.    These are the same pts (now being labeled as having "irritable larynx syndrome" by some cough centers) who not infrequently have a history of having failed to tolerate ace inhibitors,  dry powder inhalers or biphosphonates or report having atypical reflux symptoms that don't respond to standard doses of PPI , and are easily confused as having aecopd or asthma flares by even experienced allergists/ pulmonologists.   He could certainly also have asthma but note all his symptoms suggestive of this have resolved on prednisone and he never had any noct symptoms.  Welcomed him to return for any resp symptoms that recur off prednisone - if just urticaria related then allergy eval woul be more appropriate in future.  Rule of two's for asthma reviewed   Total time devoted to counseling  = 35/67m review case with pt/ discussion of options/alternatives/ personally creating in presence of pt  then going over specific  Instructions directly with the pt including how to use all of the meds but in particular covering each new medication in detail (see avs)

## 2015-10-05 NOTE — Patient Instructions (Signed)
Finish the prednisone as plan  If you start needing the albuterol again please return here right away.  For the throat clearing, best treatment: GERD (REFLUX)  is an extremely common cause of respiratory symptoms just like yours , many times with no obvious heartburn at all.    It can be treated with medication, but also with lifestyle changes including elevation of the head of your bed (ideally with 6 inch  bed blocks),  Smoking cessation, avoidance of late meals, excessive alcohol, and avoid fatty foods, chocolate, peppermint, colas, red wine, and acidic juices such as orange juice.  NO MINT OR MENTHOL PRODUCTS SO NO COUGH DROPS  USE SUGARLESS CANDY INSTEAD (Jolley ranchers or Stover's or Life Savers) or even ice chips will also do - the key is to swallow to prevent all throat clearing. NO OIL BASED VITAMINS - use powdered substitutes.  If still not better > try Try prilosec otc 20mg   Take 30-60 min before first meal of the day and Pepcid ac (famotidine) 20 mg one @  bedtime until the problem  is completely gone for at least a week   Pulmonary follow up is as needed

## 2015-11-26 ENCOUNTER — Other Ambulatory Visit: Payer: Self-pay | Admitting: Internal Medicine

## 2016-02-07 ENCOUNTER — Encounter: Payer: Self-pay | Admitting: Internal Medicine

## 2016-02-20 ENCOUNTER — Encounter: Payer: Self-pay | Admitting: Internal Medicine

## 2016-02-20 DIAGNOSIS — R110 Nausea: Secondary | ICD-10-CM

## 2016-02-20 DIAGNOSIS — R195 Other fecal abnormalities: Secondary | ICD-10-CM

## 2016-02-22 ENCOUNTER — Ambulatory Visit: Payer: Self-pay | Admitting: Internal Medicine

## 2016-02-22 ENCOUNTER — Ambulatory Visit (INDEPENDENT_AMBULATORY_CARE_PROVIDER_SITE_OTHER): Payer: No Typology Code available for payment source | Admitting: Internal Medicine

## 2016-02-22 ENCOUNTER — Encounter: Payer: Self-pay | Admitting: Internal Medicine

## 2016-02-22 VITALS — BP 130/70 | HR 83 | Temp 98.1°F | Wt 173.5 lb

## 2016-02-22 DIAGNOSIS — R202 Paresthesia of skin: Secondary | ICD-10-CM

## 2016-02-22 DIAGNOSIS — R110 Nausea: Secondary | ICD-10-CM

## 2016-02-22 DIAGNOSIS — R42 Dizziness and giddiness: Secondary | ICD-10-CM | POA: Diagnosis not present

## 2016-02-22 DIAGNOSIS — R350 Frequency of micturition: Secondary | ICD-10-CM

## 2016-02-22 DIAGNOSIS — Z23 Encounter for immunization: Secondary | ICD-10-CM

## 2016-02-22 DIAGNOSIS — Z1159 Encounter for screening for other viral diseases: Secondary | ICD-10-CM

## 2016-02-22 DIAGNOSIS — R490 Dysphonia: Secondary | ICD-10-CM

## 2016-02-22 DIAGNOSIS — R195 Other fecal abnormalities: Secondary | ICD-10-CM

## 2016-02-22 DIAGNOSIS — R0982 Postnasal drip: Secondary | ICD-10-CM

## 2016-02-22 DIAGNOSIS — J029 Acute pharyngitis, unspecified: Secondary | ICD-10-CM

## 2016-02-22 DIAGNOSIS — R1032 Left lower quadrant pain: Secondary | ICD-10-CM

## 2016-02-22 LAB — POC URINALSYSI DIPSTICK (AUTOMATED)
BILIRUBIN UA: NEGATIVE
GLUCOSE UA: NEGATIVE
KETONES UA: NEGATIVE
Leukocytes, UA: NEGATIVE
Nitrite, UA: NEGATIVE
Protein, UA: NEGATIVE
RBC UA: NEGATIVE
SPEC GRAV UA: 1.025
UROBILINOGEN UA: NEGATIVE
pH, UA: 6

## 2016-02-22 LAB — CBC
HCT: 44.6 % (ref 38.5–50.0)
HEMOGLOBIN: 15.4 g/dL (ref 13.2–17.1)
MCH: 30.1 pg (ref 27.0–33.0)
MCHC: 34.5 g/dL (ref 32.0–36.0)
MCV: 87.3 fL (ref 80.0–100.0)
MPV: 11.6 fL (ref 7.5–12.5)
PLATELETS: 181 10*3/uL (ref 140–400)
RBC: 5.11 MIL/uL (ref 4.20–5.80)
RDW: 13.6 % (ref 11.0–15.0)
WBC: 5.4 10*3/uL (ref 3.8–10.8)

## 2016-02-22 NOTE — Progress Notes (Signed)
Subjective:    Patient ID: Brian Matthews, male    DOB: [birth year], 65 y.o.   MRN: [id number]  HPI  Pt presents to the clinic today with c/o multiple complaints:  1: Sore throat and hoarseness. Secondary to post nasal drip. He is taking Zyrtec and the Nasocort at night. He is following with Dr. Melvyn Novas for his allergies.  2: Numbness and tingling in hands and feet, particularly in toes and fingers. He first noticed this a few years ago but has noticed this has become more frequent in the last year. He often notices it more when he is exercising. He does feel like his fingers and toes get cold quicker than other people.  3: Nausea, LLQ tenderness and loose stool. He reports this has been intermittent over the last week, but he has had this also intermittently over the last 6 months. He denies vomiting. He is having about 6 or 7 bowel movements per day (stools not watery but appear to be softer than usual).He denies blood in his stool, abdominal cramping or constipation. He is not sure if this is related to certain foods. He is taking Zantac 150 mg every night for some reflux, which works well for him. He has been told that he may have diverticulosis based on a CT scan but this was not confirmed on colonoscopy about 6 years ago. His sister had colon cancer, but denies history of gluten or lactose intolerances. He denies recent changes in diet. He does eat a lot of tomato's and nuts.  4: Urinary Frequency: Intermittent, he has not noticed this in the last few weeks. He denies dysuria, urgency, or blood in his urine. His prostate is normal per his report. He does take [employer] for prostate health.  5: Some lightheadedness (mild dizziness) at times. Intermittent over the last few years. He describes it as just feeling off balance. He has had ringing in his ears, all his life, but denies ear pain. He denies visual changes, chest pain, shortness of breath, or near syncope.   Review of Systems     Past Medical History:  Diagnosis Date  . Childhood asthma   . History of blood transfusion   . Hypertension   . Positive TB test     Current Outpatient Prescriptions  Medication Sig Dispense Refill  . albuterol (PROVENTIL HFA;VENTOLIN HFA) 108 (90 Base) MCG/ACT inhaler Inhale 2 puffs into the lungs every 6 (six) hours as needed for wheezing or shortness of breath. 1 Inhaler 0  . amLODipine (NORVASC) 5 MG tablet TAKE 1 TABLET(5 MG) BY MOUTH DAILY 30 tablet 6  . Cholecalciferol (VITAMIN D-3 PO) Take 1 capsule by mouth 2 (two) times a week.     . Cyanocobalamin (VITAMIN B-12 PO) Take 1 tablet by mouth 2 (two) times a week.     . Misc Natural Products (GLUCOS-CHONDROIT-MSM COMPLEX) TABS Take 3 tablets by mouth daily.    . Multiple Vitamin (MULTIVITAMIN) tablet Take 1 tablet by mouth daily.    . Omega-3 Fatty Acids (FISH OIL) 1000 MG CAPS Take 1 capsule by mouth 3 (three) times daily.    . predniSONE (STERAPRED UNI-PAK 21 TAB) 10 MG (21) TBPK tablet Take 1 tablet (10 mg total) by mouth daily. Take 6 tabs by mouth daily  for 2 days, then 5 tabs for 2 days, then 4 tabs for 2 days, then 3 tabs for 2 days, 2 tabs for 2 days, then 1 tab by mouth daily for 2 days 42  tablet 0  . Saw Palmetto 450 MG CAPS Take 3 capsules by mouth daily. Blended with [employer]    . triamterene-hydrochlorothiazide (MAXZIDE-25) 37.5-25 MG tablet TAKE 1 TABLET DAILY 90 tablet 1   No current facility-administered medications for this visit.     Allergies  Allergen Reactions  . Lisinopril Other (See Comments)    cough  . Penicillins Other (See Comments)    Does not remember    Family History  Problem Relation Age of Onset  . Hypertension Mother   . Arthritis Father   . Cancer Sister     Colon    Social History   Social History  . Marital status: Single    Spouse name: N/A  . Number of children: N/A  . Years of education: N/A   Occupational History  . Not on file.   Social History Main Topics  . Smoking  status: Never Smoker  . Smokeless tobacco: Not on file  . Alcohol use No  . Drug use: No  . Sexual activity: Not on file   Other Topics Concern  . Not on file   Social History Narrative  . No narrative on file     Constitutional: Denies fever, malaise, fatigue, headache or abrupt weight changes.  HEENT: Pt reports sore throat and hoarseness. Denies eye pain, eye redness, ear pain, ringing in the ears, wax buildup, runny nose, nasal congestion, bloody nose. Respiratory: Denies difficulty breathing, shortness of breath, cough or sputum production.   Cardiovascular: Denies chest pain, chest tightness, palpitations or swelling in the hands or feet.  Gastrointestinal: Pt reports nausea and loose stool. Denies abdominal pain, bloating, constipation, diarrhea or blood in the stool.  GU: Pt reports urinary frequency. Denies urgency, pain with urination, burning sensation, blood in urine, odor or discharge. Musculoskeletal: Denies decrease in range of motion, difficulty with gait, muscle pain or joint pain and swelling.  Skin: Denies redness, rashes, lesions or ulcercations.  Neurological: Pt reports numbness and tingling in the hands and feet. Denies dizziness, difficulty with memory, difficulty with speech or problems with balance and coordination.  Psych: Denies anxiety, depression, SI/HI.  No other specific complaints in a complete review of systems (except as listed in HPI above).  Objective:   Physical Exam  BP 130/70   Pulse 83   Temp 98.1 F (36.7 C) (Oral)   Wt 173 lb 8 oz (78.7 kg)   SpO2 97%   BMI 23.53 kg/m  Wt Readings from Last 3 Encounters:  02/22/16 173 lb 8 oz (78.7 kg)  10/05/15 173 lb 9.6 oz (78.7 kg)  09/24/15 176 lb 8 oz (80.1 kg)    General: Appears his stated age, well developed, well nourished in NAD. Skin: Warm, dry and intact. No rashes, lesions or ulcerations noted. HEENT: Head: normal shape and size; Eyes: sclera white, no icterus, conjunctiva pink,  PERRLA and EOMs intact; Ears: Tm's gray and intact, normal light reflex; Throat/Mouth: Teeth present, mucosa pink and moist, + PND, no exudate, lesions or ulcerations noted.  Neck:  Neck supple, trachea midline. No masses, lumps or thyromegaly present.  Cardiovascular: Normal rate and rhythm. S1,S2 noted.  No murmur, rubs or gallops noted. No JVD or BLE edema. No carotid bruits noted. Cap refill 4-5 secs in hands, < 3 sec in toes. Pulmonary/Chest: Normal effort and positive vesicular breath sounds. No respiratory distress. No wheezes, rales or ronchi noted.  Abdomen: Soft and nontender. Normal bowel sounds. No distention or masses noted. Liver, spleen and kidneys  non palpable. Neurological: Alert and oriented. Cranial nerves II-XII grossly intact. Coordination normal.  Psychiatric: Mood is slightly anxious appearing.  BMET    Component Value Date/Time   NA 138 10/17/2014 1128   K 4.2 10/17/2014 1128   CL 102 10/17/2014 1128   CO2 32 10/17/2014 1128   GLUCOSE 91 10/17/2014 1128   BUN 11 10/17/2014 1128   CREATININE 1.12 10/17/2014 1128   CALCIUM 9.5 10/17/2014 1128    Lipid Panel  No results found for: CHOL, TRIG, HDL, CHOLHDL, VLDL, LDLCALC  CBC No results found for: WBC, RBC, HGB, HCT, PLT, MCV, MCH, MCHC, RDW, LYMPHSABS, MONOABS, EOSABS, BASOSABS  Hgb A1C No results found for: HGBA1C          Assessment & Plan:   Sore throat and hoarseness secondary to PND:  Advised him to switch antihistamines up to Xyzal or Allegra, and nasal sprays to Flonase or Rhinocort If persist, we can refer him to an allergist for allergy testing  Paresthesia of hands and feet:  Circulation intact Wil check A1C, B12, TSH, Vit D and Folate If laves normal, can refer to neurology for EMG testing  Nausea, LLQ abdominal pain and loose stools;  ? Diverticulosis vs IBS, vs food allergy Continue Pepcid CBC, CMET, Hep C and H Pylori today  Urinary frequency:  Urinalysis: normal A1C  today He will continue Saw Palmetto for prostate health  Lightheadedness:  ? Dehydration Encouraged him to drink more fluids Will check CBC, CMET and TSH today  Will follow up after labs, RTC as needed

## 2016-02-23 LAB — COMPREHENSIVE METABOLIC PANEL
ALBUMIN: 4.3 g/dL (ref 3.6–5.1)
ALT: 16 U/L (ref 9–46)
AST: 18 U/L (ref 10–35)
Alkaline Phosphatase: 58 U/L (ref 40–115)
BUN: 18 mg/dL (ref 7–25)
CHLORIDE: 103 mmol/L (ref 98–110)
CO2: 23 mmol/L (ref 20–31)
CREATININE: 1.18 mg/dL (ref 0.70–1.25)
Calcium: 9.4 mg/dL (ref 8.6–10.3)
Glucose, Bld: 90 mg/dL (ref 65–99)
POTASSIUM: 4.1 mmol/L (ref 3.5–5.3)
SODIUM: 141 mmol/L (ref 135–146)
Total Bilirubin: 0.5 mg/dL (ref 0.2–1.2)
Total Protein: 7.1 g/dL (ref 6.1–8.1)

## 2016-02-23 LAB — TSH: TSH: 0.01 m[IU]/L — AB (ref 0.40–4.50)

## 2016-02-23 LAB — FOLATE

## 2016-02-23 LAB — VITAMIN B12: VITAMIN B 12: 591 pg/mL (ref 200–1100)

## 2016-02-23 LAB — HEPATITIS C ANTIBODY: HCV Ab: NEGATIVE

## 2016-02-23 LAB — HEMOGLOBIN A1C
HEMOGLOBIN A1C: 5.3 % (ref <5.7)
MEAN PLASMA GLUCOSE: 105 mg/dL

## 2016-02-23 LAB — VITAMIN D 25 HYDROXY (VIT D DEFICIENCY, FRACTURES): VIT D 25 HYDROXY: 38 ng/mL (ref 30–100)

## 2016-02-24 NOTE — Patient Instructions (Signed)

## 2016-02-25 ENCOUNTER — Encounter: Payer: Self-pay | Admitting: Internal Medicine

## 2016-02-26 LAB — HELICOBACTER PYLORI  SPECIAL ANTIGEN: H. PYLORI ANTIGEN STOOL: NOT DETECTED

## 2016-03-04 NOTE — Addendum Note (Signed)
Addended by: Lurlean Nanny on: 03/04/2016 02:38 PM   Modules accepted: Orders

## 2016-03-07 ENCOUNTER — Other Ambulatory Visit: Payer: Self-pay | Admitting: Internal Medicine

## 2016-03-07 DIAGNOSIS — R7989 Other specified abnormal findings of blood chemistry: Secondary | ICD-10-CM

## 2016-03-14 ENCOUNTER — Other Ambulatory Visit (INDEPENDENT_AMBULATORY_CARE_PROVIDER_SITE_OTHER): Payer: No Typology Code available for payment source

## 2016-03-14 DIAGNOSIS — R946 Abnormal results of thyroid function studies: Secondary | ICD-10-CM | POA: Diagnosis not present

## 2016-03-14 DIAGNOSIS — R7989 Other specified abnormal findings of blood chemistry: Secondary | ICD-10-CM

## 2016-03-14 LAB — TSH: TSH: 0.02 u[IU]/mL — AB (ref 0.35–4.50)

## 2016-03-14 LAB — T4, FREE: Free T4: 1.86 ng/dL — ABNORMAL HIGH (ref 0.60–1.60)

## 2016-03-17 ENCOUNTER — Other Ambulatory Visit: Payer: Self-pay | Admitting: Internal Medicine

## 2016-03-18 ENCOUNTER — Other Ambulatory Visit: Payer: Self-pay | Admitting: Internal Medicine

## 2016-03-18 ENCOUNTER — Encounter: Payer: Self-pay | Admitting: Internal Medicine

## 2016-03-18 DIAGNOSIS — E0590 Thyrotoxicosis, unspecified without thyrotoxic crisis or storm: Secondary | ICD-10-CM

## 2016-04-03 ENCOUNTER — Ambulatory Visit (INDEPENDENT_AMBULATORY_CARE_PROVIDER_SITE_OTHER): Payer: No Typology Code available for payment source | Admitting: Endocrinology

## 2016-04-03 ENCOUNTER — Encounter: Payer: Self-pay | Admitting: Endocrinology

## 2016-04-03 DIAGNOSIS — E0590 Thyrotoxicosis, unspecified without thyrotoxic crisis or storm: Secondary | ICD-10-CM

## 2016-04-03 NOTE — Progress Notes (Signed)
Subjective:    Patient ID: Brian Matthews, male    DOB: March 05, 1951, 65 y.o.   MRN: [id number]  HPI Pt was seen in office 1 month ago, with multiple symptoms, and TSH was noted to be suppressed.  He says in retrospect, he has slight numbness of the 4 extremities, and assoc bowel frequency.  He has no prior thyroid hx.  He has never had XRT to the anterior neck, or thyroid surgery.  He has never had thyroid imaging.  He does not consume kelp or any other prescribed or non-prescribed thyroid medication.  He has never been on amiodarone.   Past Medical History:  Diagnosis Date  . Childhood asthma   . History of blood transfusion   . Hypertension   . Hyperthyroidism   . Positive TB test     No past surgical history on file.  Social History   Social History  . Marital status: Single    Spouse name: N/A  . Number of children: N/A  . Years of education: N/A   Occupational History  . Not on file.   Social History Main Topics  . Smoking status: Never Smoker  . Smokeless tobacco: Never Used  . Alcohol use No  . Drug use: No  . Sexual activity: Not on file   Other Topics Concern  . Not on file   Social History Narrative  . No narrative on file    Current Outpatient Prescriptions on File Prior to Visit  Medication Sig Dispense Refill  . amLODipine (NORVASC) 5 MG tablet TAKE 1 TABLET(5 MG) BY MOUTH DAILY 30 tablet 6  . Cholecalciferol (VITAMIN D-3 PO) Take 1 capsule by mouth 2 (two) times a week.     . Cyanocobalamin (VITAMIN B-12 PO) Take 1 tablet by mouth 2 (two) times a week.     . Misc Natural Products (GLUCOS-CHONDROIT-MSM COMPLEX) TABS Take 3 tablets by mouth daily.    . Multiple Vitamin (MULTIVITAMIN) tablet Take 1 tablet by mouth daily.    . Omega-3 Fatty Acids (FISH OIL) 1000 MG CAPS Take 1 capsule by mouth 3 (three) times daily.    . Saw Palmetto 450 MG CAPS Take 3 capsules by mouth daily. Blended with Kelp    . triamterene-hydrochlorothiazide (MAXZIDE-25) 37.5-25  MG tablet TAKE 1 TABLET DAILY 90 tablet 0  . albuterol (PROVENTIL HFA;VENTOLIN HFA) 108 (90 Base) MCG/ACT inhaler Inhale 2 puffs into the lungs every 6 (six) hours as needed for wheezing or shortness of breath. (Patient not taking: Reported on 04/03/2016) 1 Inhaler 0   No current facility-administered medications on file prior to visit.     Allergies  Allergen Reactions  . Lisinopril Other (See Comments)    cough  . Penicillins Other (See Comments)    Does not remember    Family History  Problem Relation Age of Onset  . Hypertension Mother   . Arthritis Father   . Cancer Sister     Colon  . Thyroid disease Neg Hx     BP 124/84 (BP Location: Left Arm, Patient Position: Sitting)   Pulse 83   Wt 173 lb (78.5 kg)   SpO2 97%   BMI 23.46 kg/m   Review of Systems denies weight loss, headache, hoarseness, diplopia, palpitations, sob, diarrhea, polyuria, muscle weakness, excessive diaphoresis, tremor, anxiety, heat intolerance, and easy bruising.  He has rhinorrhea.      Objective:   Physical Exam VS: see vs page GEN: no distress HEAD: head: no deformity eyes: no  periorbital swelling, no proptosis external nose and ears are normal mouth: no lesion seen NECK: I think I can feel the top of a goiter, but no palpable nodule.   CHEST WALL: no deformity LUNGS: clear to auscultation CV: reg rate and rhythm, no murmur ABD: abdomen is soft, nontender.  no hepatosplenomegaly.  not distended.  no hernia MUSCULOSKELETAL: muscle bulk and strength are grossly normal.  no obvious joint swelling.  gait is normal and steady EXTEMITIES: no deformity.  no ulcer on the feet.  feet are of normal color and temp.  no edema PULSES: dorsalis pedis intact bilat.  no carotid bruit NEURO:  cn 2-12 grossly intact.   readily moves all 4's.  sensation is intact to touch on the feet.  No tremor SKIN:  Normal texture and temperature.  No rash or suspicious lesion is visible.  Not diaphoretic NODES:  None  palpable at the neck PSYCH: alert, well-oriented.  Does not appear anxious nor depressed.   Lab Results  Component Value Date   TSH 0.02 (L) 03/14/2016   CXR: no mention is made of a goiter.    I have reviewed outside records, and summarized: Pt was seen on 02/22/16 with multiple symptoms, and multiple labs were ordered.    Assessment & Plan:  Hyperthyroidism, new, uncertain etiology.  We discussed rx options.  He chooses [facility].   Patient is advised the following:   Patient Instructions  let's check a thyroid "scan" (a special, but easy and painless type of thyroid x ray).  It works like this: you go to the x-ray department of the hospital to swallow a pill, which contains a miniscule amount of radiation.  You will not notice any symptoms from this.  You will go back to the x-ray department the next day, to lie down in front of a camera.  The results of this will be sent to me.   Based on the results, i hope to order for you a treatment pill of radioactive iodine.  Although it is a larger amount of radiation, you will again notice no symptoms from this.  The pill is gone from your body in a few days (during which you should stay away from other people), but takes several months to work.  Therefore, please return here approximately 6-8 weeks after the treatment.  This treatment has been available for many years, and the only known side-effect is an underactive thyroid.  It is possible that i would eventually prescribe for you a thyroid hormone pill, which is very inexpensive.  You don't have to worry about side-effects of this thyroid hormone pill, because it is the same molecule your thyroid makes.      Hyperthyroidism Hyperthyroidism is when the thyroid is too active (overactive). Your thyroid is a large gland that is located in your neck. The thyroid helps to control how your body uses food (metabolism). When your thyroid is overactive, it produces too much of a hormone called thyroxine.    CAUSES Causes of hyperthyroidism may include:  Graves disease. This is when your immune system attacks the thyroid gland. This is the most common cause.  Inflammation of the thyroid gland.  Tumor in the thyroid gland or somewhere else.  Excessive use of thyroid medicines, including:  Prescription thyroid supplement.  Herbal supplements that mimic thyroid hormones.  Solid or fluid-filled lumps within your thyroid gland (thyroid nodules).  Excessive ingestion of iodine. RISK FACTORS  Being male.  Having a family history of thyroid conditions.  SIGNS AND SYMPTOMS Signs and symptoms of hyperthyroidism may include:  Nervousness.  Inability to tolerate heat.  Unexplained weight loss.  Diarrhea.  Change in the texture of hair or skin.  Heart skipping beats or making extra beats.  Rapid heart rate.  Loss of menstruation.  Shaky hands.  Fatigue.  Restlessness.  Increased appetite.  Sleep problems.  Enlarged thyroid gland or nodules. DIAGNOSIS  Diagnosis of hyperthyroidism may include:  Medical history and physical exam.  Blood tests.  Ultrasound tests. TREATMENT Treatment may include:  Medicines to control your thyroid.  Surgery to remove your thyroid.  Radiation therapy. HOME CARE INSTRUCTIONS   Take medicines only as directed by your health care provider.  Do not use any tobacco products, including cigarettes, chewing tobacco, or electronic cigarettes. If you need help quitting, ask your health care provider.  Do not exercise or do physical activity until your health care provider approves.  Keep all follow-up appointments as directed by your health care provider. This is important. SEEK MEDICAL CARE IF:  Your symptoms do not get better with treatment.  You have fever.  You are taking thyroid replacement medicine and you:  Have depression.  Feel mentally and physically slow.  Have weight gain. SEEK IMMEDIATE MEDICAL CARE IF:    You have decreased alertness or a change in your awareness.  You have abdominal pain.  You feel dizzy.  You have a rapid heartbeat.  You have an irregular heartbeat.   This information is not intended to replace advice given to you by your health care provider. Make sure you discuss any questions you have with your health care provider.   Document Released: 06/02/2005 Document Revised: 06/23/2014 Document Reviewed: 10/18/2013 Elsevier Interactive Patient Education [employer].

## 2016-04-03 NOTE — Patient Instructions (Addendum)
let's check a thyroid "scan" (a special, but easy and painless type of thyroid x ray).  It works like this: you go to the x-ray department of the hospital to swallow a pill, which contains a miniscule amount of radiation.  You will not notice any symptoms from this.  You will go back to the x-ray department the next day, to lie down in front of a camera.  The results of this will be sent to me.   Based on the results, i hope to order for you a treatment pill of radioactive iodine.  Although it is a larger amount of radiation, you will again notice no symptoms from this.  The pill is gone from your body in a few days (during which you should stay away from other people), but takes several months to work.  Therefore, please return here approximately 6-8 weeks after the treatment.  This treatment has been available for many years, and the only known side-effect is an underactive thyroid.  It is possible that i would eventually prescribe for you a thyroid hormone pill, which is very inexpensive.  You don't have to worry about side-effects of this thyroid hormone pill, because it is the same molecule your thyroid makes.      Hyperthyroidism Hyperthyroidism is when the thyroid is too active (overactive). Your thyroid is a large gland that is located in your neck. The thyroid helps to control how your body uses food (metabolism). When your thyroid is overactive, it produces too much of a hormone called thyroxine.  CAUSES Causes of hyperthyroidism may include:  Graves disease. This is when your immune system attacks the thyroid gland. This is the most common cause.  Inflammation of the thyroid gland.  Tumor in the thyroid gland or somewhere else.  Excessive use of thyroid medicines, including:  Prescription thyroid supplement.  Herbal supplements that mimic thyroid hormones.  Solid or fluid-filled lumps within your thyroid gland (thyroid nodules).  Excessive ingestion of iodine. RISK  FACTORS  Being male.  Having a family history of thyroid conditions. SIGNS AND SYMPTOMS Signs and symptoms of hyperthyroidism may include:  Nervousness.  Inability to tolerate heat.  Unexplained weight loss.  Diarrhea.  Change in the texture of hair or skin.  Heart skipping beats or making extra beats.  Rapid heart rate.  Loss of menstruation.  Shaky hands.  Fatigue.  Restlessness.  Increased appetite.  Sleep problems.  Enlarged thyroid gland or nodules. DIAGNOSIS  Diagnosis of hyperthyroidism may include:  Medical history and physical exam.  Blood tests.  Ultrasound tests. TREATMENT Treatment may include:  Medicines to control your thyroid.  Surgery to remove your thyroid.  Radiation therapy. HOME CARE INSTRUCTIONS   Take medicines only as directed by your health care provider.  Do not use any tobacco products, including cigarettes, chewing tobacco, or electronic cigarettes. If you need help quitting, ask your health care provider.  Do not exercise or do physical activity until your health care provider approves.  Keep all follow-up appointments as directed by your health care provider. This is important. SEEK MEDICAL CARE IF:  Your symptoms do not get better with treatment.  You have fever.  You are taking thyroid replacement medicine and you:  Have depression.  Feel mentally and physically slow.  Have weight gain. SEEK IMMEDIATE MEDICAL CARE IF:   You have decreased alertness or a change in your awareness.  You have abdominal pain.  You feel dizzy.  You have a rapid heartbeat.  You have  an irregular heartbeat.   This information is not intended to replace advice given to you by your health care provider. Make sure you discuss any questions you have with your health care provider.   Document Released: 06/02/2005 Document Revised: 06/23/2014 Document Reviewed: 10/18/2013 Elsevier Interactive Patient Education [employer].

## 2016-04-07 ENCOUNTER — Telehealth: Payer: Self-pay | Admitting: Endocrinology

## 2016-04-07 ENCOUNTER — Telehealth: Payer: Self-pay

## 2016-04-07 NOTE — Telephone Encounter (Signed)
Yes, you should avoid strenuous activity until the thyroid is improved. This is because the thyroid already stimulates the heart.

## 2016-04-07 NOTE — Telephone Encounter (Signed)
Called and left message advising of Dr.Ellison's message of why not to do strenuous activities. Gave call back number if any questions.

## 2016-04-07 NOTE — Telephone Encounter (Signed)
Patient has some question about his after visit summary. It stated that he could not do any exercise until talking to doctor first. He wants to know why

## 2016-04-10 ENCOUNTER — Encounter (HOSPITAL_COMMUNITY)
Admission: RE | Admit: 2016-04-10 | Discharge: 2016-04-10 | Disposition: A | Discharge status: Home / Self Care | Payer: No Typology Code available for payment source | Source: Ambulatory Visit | Attending: Endocrinology | Admitting: Endocrinology

## 2016-04-10 DIAGNOSIS — E0590 Thyrotoxicosis, unspecified without thyrotoxic crisis or storm: Secondary | ICD-10-CM | POA: Insufficient documentation

## 2016-04-10 MED ORDER — SODIUM IODIDE I 131 CAPSULE
9.7000 | Freq: Once | INTRAVENOUS | Status: AC | PRN
  Administered 2016-04-10: 9.7 via ORAL

## 2016-04-11 ENCOUNTER — Encounter (HOSPITAL_COMMUNITY)
Admission: RE | Admit: 2016-04-11 | Discharge: 2016-04-11 | Disposition: A | Discharge status: Home / Self Care | Payer: No Typology Code available for payment source | Source: Ambulatory Visit | Attending: Endocrinology | Admitting: Endocrinology

## 2016-04-11 DIAGNOSIS — E0590 Thyrotoxicosis, unspecified without thyrotoxic crisis or storm: Secondary | ICD-10-CM | POA: Diagnosis not present

## 2016-04-16 ENCOUNTER — Encounter: Payer: Self-pay | Admitting: Endocrinology

## 2016-04-16 ENCOUNTER — Ambulatory Visit (INDEPENDENT_AMBULATORY_CARE_PROVIDER_SITE_OTHER): Payer: No Typology Code available for payment source | Admitting: Endocrinology

## 2016-04-16 VITALS — BP 112/72 | HR 83 | Ht 72.0 in | Wt 171.0 lb

## 2016-04-16 DIAGNOSIS — E0590 Thyrotoxicosis, unspecified without thyrotoxic crisis or storm: Secondary | ICD-10-CM

## 2016-04-16 LAB — T4, FREE: Free T4: 1.26 ng/dL (ref 0.60–1.60)

## 2016-04-16 LAB — TSH: TSH: 0.04 u[IU]/mL — ABNORMAL LOW (ref 0.35–4.50)

## 2016-04-16 NOTE — Progress Notes (Signed)
   Subjective:    Patient ID: Brian Matthews, male    DOB: 05-20-1951, 65 y.o.   MRN: [id number]  HPI Pt returns for f/u of hyperthyroidism (dx'ed 2017; nuc med scan showed very low uptake).  He says he does not take any iodine products.  He says itching is improved.   Past Medical History:  Diagnosis Date  . Childhood asthma   . History of blood transfusion   . Hypertension   . Hyperthyroidism   . Positive TB test     No past surgical history on file.  Social History   Social History  . Marital status: Single    Spouse name: N/A  . Number of children: N/A  . Years of education: N/A   Occupational History  . Not on file.   Social History Main Topics  . Smoking status: Never Smoker  . Smokeless tobacco: Never Used  . Alcohol use No  . Drug use: No  . Sexual activity: Not on file   Other Topics Concern  . Not on file   Social History Narrative  . No narrative on file    Current Outpatient Prescriptions on File Prior to Visit  Medication Sig Dispense Refill  . amLODipine (NORVASC) 5 MG tablet TAKE 1 TABLET(5 MG) BY MOUTH DAILY 30 tablet 6  . Cholecalciferol (VITAMIN D-3 PO) Take 1 capsule by mouth 2 (two) times a week.     . Cyanocobalamin (VITAMIN B-12 PO) Take 1 tablet by mouth 2 (two) times a week.     . Misc Natural Products (GLUCOS-CHONDROIT-MSM COMPLEX) TABS Take 3 tablets by mouth daily.    . Multiple Vitamin (MULTIVITAMIN) tablet Take 1 tablet by mouth daily.    . Omega-3 Fatty Acids (FISH OIL) 1000 MG CAPS Take 1 capsule by mouth 3 (three) times daily.    . Saw Palmetto 450 MG CAPS Take 3 capsules by mouth daily. Blended with Kelp    . triamterene-hydrochlorothiazide (MAXZIDE-25) 37.5-25 MG tablet TAKE 1 TABLET DAILY 90 tablet 0   No current facility-administered medications on file prior to visit.     Allergies  Allergen Reactions  . Lisinopril Other (See Comments)    cough  . Penicillins Other (See Comments)    Does not remember    Family  History  Problem Relation Age of Onset  . Hypertension Mother   . Arthritis Father   . Cancer Sister     Colon  . Thyroid disease Neg Hx     BP 112/72   Pulse 83   Ht 6' (1.829 m)   Wt 171 lb (77.6 kg)   SpO2 98%   BMI 23.19 kg/m    Review of Systems No weight change.      Objective:   Physical Exam VITAL SIGNS:  See vs page GENERAL: no distress NECK: There is no palpable thyroid enlargement.  No thyroid nodule is palpable.  No palpable lymphadenopathy at the anterior neck.  Lab Results  Component Value Date   TSH 0.04 (L) 04/16/2016      Assessment & Plan:  Hyperthyroidism, apparently due to subacute thyroiditis, improved without rx.  We'll follow.  Please come back for a follow-up appointment in 1 month.

## 2016-04-16 NOTE — Patient Instructions (Signed)
blood tests are requested for you today.  We'll let you know about the results. It is likely that your thyroid has gone low.  This is due to a virus, that gets better with time.  However, you may need to take a thyroid hormone pill for now.  Please come back for a follow-up appointment in 1 month.

## 2016-05-16 ENCOUNTER — Encounter: Payer: Self-pay | Admitting: Endocrinology

## 2016-05-18 NOTE — Progress Notes (Signed)
   Subjective:    Patient ID: Brian Matthews, male    DOB: 1951/02/14, 65 y.o.   MRN: [id number]  HPI Pt returns for f/u of hyperthyroidism due to subacute thyroiditis (dx'ed 2017; nuc med scan showed very low uptake; f/u TFT were much better without rx).  He reports intermittent itching throughout the body, and assoc palpitations.  He does not take iodine products.   Past Medical History:  Diagnosis Date  . Childhood asthma   . History of blood transfusion   . Hypertension   . Hyperthyroidism   . Positive TB test     No past surgical history on file.  Social History   Social History  . Marital status: Single    Spouse name: N/A  . Number of children: N/A  . Years of education: N/A   Occupational History  . Not on file.   Social History Main Topics  . Smoking status: Never Smoker  . Smokeless tobacco: Never Used  . Alcohol use No  . Drug use: No  . Sexual activity: Not on file   Other Topics Concern  . Not on file   Social History Narrative  . No narrative on file    Current Outpatient Prescriptions on File Prior to Visit  Medication Sig Dispense Refill  . amLODipine (NORVASC) 5 MG tablet TAKE 1 TABLET(5 MG) BY MOUTH DAILY 30 tablet 6  . Cholecalciferol (VITAMIN D-3 PO) Take 1 capsule by mouth 2 (two) times a week.     . Cyanocobalamin (VITAMIN B-12 PO) Take 1 tablet by mouth 2 (two) times a week.     . Misc Natural Products (GLUCOS-CHONDROIT-MSM COMPLEX) TABS Take 3 tablets by mouth daily.    . Multiple Vitamin (MULTIVITAMIN) tablet Take 1 tablet by mouth daily.    . Omega-3 Fatty Acids (FISH OIL) 1000 MG CAPS Take 1 capsule by mouth 3 (three) times daily.    . Saw Palmetto 450 MG CAPS Take 3 capsules by mouth daily. Blended with Kelp    . triamterene-hydrochlorothiazide (MAXZIDE-25) 37.5-25 MG tablet TAKE 1 TABLET DAILY 90 tablet 0   No current facility-administered medications on file prior to visit.     Allergies  Allergen Reactions  . Lisinopril Other  (See Comments)    cough  . Penicillins Other (See Comments)    Does not remember    Family History  Problem Relation Age of Onset  . Hypertension Mother   . Arthritis Father   . Cancer Sister     Colon  . Thyroid disease Neg Hx     BP (!) 154/90   Pulse 75   Wt 175 lb 3.2 oz (79.5 kg)   SpO2 95%   BMI 23.76 kg/m    Review of Systems Denies fever, but he has slight lightheadedness.  He has intermittent heat intolerance, insomnia, acral numbness, and hair loss.      Objective:   Physical Exam VITAL SIGNS:  See vs page GENERAL: no distress NECK: There is no palpable thyroid enlargement.  No thyroid nodule is palpable.  No palpable lymphadenopathy at the anterior neck.    Lab Results  Component Value Date   TSH 1.26 05/19/2016      Assessment & Plan:  subacute thyroiditis.  Now euthyroid off rx, but he is at risk for recurrent abnormal TFT.   Please come back for a follow-up appointment in 2 months.

## 2016-05-19 ENCOUNTER — Encounter: Payer: Self-pay | Admitting: Endocrinology

## 2016-05-19 ENCOUNTER — Ambulatory Visit (INDEPENDENT_AMBULATORY_CARE_PROVIDER_SITE_OTHER): Payer: No Typology Code available for payment source | Admitting: Endocrinology

## 2016-05-19 VITALS — BP 154/90 | HR 75 | Wt 175.2 lb

## 2016-05-19 DIAGNOSIS — E0590 Thyrotoxicosis, unspecified without thyrotoxic crisis or storm: Secondary | ICD-10-CM

## 2016-05-19 LAB — TSH: TSH: 1.26 u[IU]/mL (ref 0.35–4.50)

## 2016-05-19 LAB — T4, FREE: Free T4: 0.62 ng/dL (ref 0.60–1.60)

## 2016-05-19 NOTE — Patient Instructions (Addendum)
Thyroid blood tests are requested for you today.  We'll let you know about the results. It is possible that your thyroid has gone low.  This is due to a virus, that gets better with time.  However, you may need to take a thyroid hormone pill for now.  Please come back for a follow-up appointment in 2 months.

## 2016-05-21 ENCOUNTER — Encounter: Payer: Self-pay | Admitting: Endocrinology

## 2016-06-13 ENCOUNTER — Other Ambulatory Visit: Payer: Self-pay | Admitting: Internal Medicine

## 2016-06-13 ENCOUNTER — Encounter: Payer: Self-pay | Admitting: Internal Medicine

## 2016-06-13 MED ORDER — TRIAMTERENE-HCTZ 37.5-25 MG PO TABS: 1.0000 | ORAL_TABLET | Freq: Every day | ORAL | 0 refills | Status: DC

## 2016-06-20 ENCOUNTER — Other Ambulatory Visit: Payer: Self-pay | Admitting: Internal Medicine

## 2016-07-21 ENCOUNTER — Encounter: Payer: Self-pay | Admitting: Endocrinology

## 2016-07-21 ENCOUNTER — Ambulatory Visit (INDEPENDENT_AMBULATORY_CARE_PROVIDER_SITE_OTHER): Payer: No Typology Code available for payment source | Admitting: Endocrinology

## 2016-07-21 VITALS — BP 132/84 | HR 72 | Ht 72.0 in | Wt 182.0 lb

## 2016-07-21 DIAGNOSIS — E0590 Thyrotoxicosis, unspecified without thyrotoxic crisis or storm: Secondary | ICD-10-CM | POA: Diagnosis not present

## 2016-07-21 LAB — TSH: TSH: 3.85 u[IU]/mL (ref 0.35–4.50)

## 2016-07-21 LAB — T4, FREE: FREE T4: 0.64 ng/dL (ref 0.60–1.60)

## 2016-07-21 NOTE — Patient Instructions (Signed)
Thyroid blood tests are requested for you today.  We'll let you know about the results.  Please come back for a follow-up appointment in 4 months.    

## 2016-07-21 NOTE — Progress Notes (Signed)
   Subjective:    Patient ID: Brian Matthews, male    DOB: 04/22/1951, 66 y.o.   MRN: [id number]  HPI Pt returns for f/u of hyperthyroidism due to subacute thyroiditis (dx'ed 2017; nuc med scan showed very low uptake; f/u TFT were much better without rx; he has never been on medication for this).  He has mild itching.  Past Medical History:  Diagnosis Date  . Childhood asthma   . History of blood transfusion   . Hypertension   . Hyperthyroidism   . Positive TB test     No past surgical history on file.  Social History   Social History  . Marital status: Single    Spouse name: N/A  . Number of children: N/A  . Years of education: N/A   Occupational History  . Not on file.   Social History Main Topics  . Smoking status: Never Smoker  . Smokeless tobacco: Never Used  . Alcohol use No  . Drug use: No  . Sexual activity: Not on file   Other Topics Concern  . Not on file   Social History Narrative  . No narrative on file    Current Outpatient Prescriptions on File Prior to Visit  Medication Sig Dispense Refill  . amLODipine (NORVASC) 5 MG tablet TAKE 1 TABLET(5 MG) BY MOUTH DAILY 30 tablet 3  . Cholecalciferol (VITAMIN D-3 PO) Take 1 capsule by mouth 2 (two) times a week.     . Cyanocobalamin (VITAMIN B-12 PO) Take 1 tablet by mouth 2 (two) times a week.     . Misc Natural Products (GLUCOS-CHONDROIT-MSM COMPLEX) TABS Take 3 tablets by mouth daily.    . Multiple Vitamin (MULTIVITAMIN) tablet Take 1 tablet by mouth daily.    . Omega-3 Fatty Acids (FISH OIL) 1000 MG CAPS Take 1 capsule by mouth 3 (three) times daily.    . Saw Palmetto 450 MG CAPS Take 3 capsules by mouth daily. Blended with Kelp    . triamterene-hydrochlorothiazide (MAXZIDE-25) 37.5-25 MG tablet Take 1 tablet by mouth daily. 90 tablet 0   No current facility-administered medications on file prior to visit.     Allergies  Allergen Reactions  . Lisinopril Other (See Comments)    cough  . Penicillins  Other (See Comments)    Does not remember    Family History  Problem Relation Age of Onset  . Hypertension Mother   . Arthritis Father   . Cancer Sister     Colon  . Thyroid disease Neg Hx     BP 132/84   Pulse 72   Ht 6' (1.829 m)   Wt 182 lb (82.6 kg)   SpO2 96%   BMI 24.68 kg/m   Review of Systems He has weight gain    Objective:   Physical Exam VITAL SIGNS:  See vs page GENERAL: no distress NECK: There is no palpable thyroid enlargement.  No thyroid nodule is palpable.  No palpable lymphadenopathy at the anterior neck.  Lab Results  Component Value Date   TSH 3.85 07/21/2016      Assessment & Plan:  Subacute thyroiditis, clinically and biochemically better Please come back for a follow-up appointment in 4 months

## 2016-09-01 ENCOUNTER — Encounter: Payer: Self-pay | Admitting: Internal Medicine

## 2016-09-11 ENCOUNTER — Encounter: Payer: Self-pay | Admitting: Internal Medicine

## 2016-09-11 ENCOUNTER — Ambulatory Visit (INDEPENDENT_AMBULATORY_CARE_PROVIDER_SITE_OTHER): Payer: No Typology Code available for payment source | Admitting: Internal Medicine

## 2016-09-11 VITALS — BP 128/80 | HR 76 | Temp 97.7°F | Ht 72.5 in | Wt 180.5 lb

## 2016-09-11 DIAGNOSIS — I10 Essential (primary) hypertension: Secondary | ICD-10-CM | POA: Diagnosis not present

## 2016-09-11 DIAGNOSIS — K219 Gastro-esophageal reflux disease without esophagitis: Secondary | ICD-10-CM | POA: Diagnosis not present

## 2016-09-11 DIAGNOSIS — E0590 Thyrotoxicosis, unspecified without thyrotoxic crisis or storm: Secondary | ICD-10-CM

## 2016-09-11 DIAGNOSIS — Z0000 Encounter for general adult medical examination without abnormal findings: Secondary | ICD-10-CM | POA: Diagnosis not present

## 2016-09-11 DIAGNOSIS — Z125 Encounter for screening for malignant neoplasm of prostate: Secondary | ICD-10-CM

## 2016-09-11 LAB — CBC
HCT: 48.1 % (ref 39.0–52.0)
Hemoglobin: 16.2 g/dL (ref 13.0–17.0)
MCHC: 33.7 g/dL (ref 30.0–36.0)
MCV: 89.3 fl (ref 78.0–100.0)
PLATELETS: 191 10*3/uL (ref 150.0–400.0)
RBC: 5.39 Mil/uL (ref 4.22–5.81)
RDW: 13.5 % (ref 11.5–15.5)
WBC: 4.4 10*3/uL (ref 4.0–10.5)

## 2016-09-11 LAB — LIPID PANEL
Cholesterol: 193 mg/dL (ref 0–200)
HDL: 36.2 mg/dL — AB (ref >39.00)
LDL Cholesterol: 136 mg/dL — ABNORMAL HIGH (ref 0–99)
NonHDL: 156.73
TRIGLYCERIDES: 104 mg/dL (ref 0.0–149.0)
Total CHOL/HDL Ratio: 5
VLDL: 20.8 mg/dL (ref 0.0–40.0)

## 2016-09-11 LAB — PSA: PSA: 0.58 ng/mL (ref 0.10–4.00)

## 2016-09-11 LAB — COMPREHENSIVE METABOLIC PANEL
ALK PHOS: 69 U/L (ref 39–117)
ALT: 26 U/L (ref 0–53)
AST: 31 U/L (ref 0–37)
Albumin: 4.7 g/dL (ref 3.5–5.2)
BILIRUBIN TOTAL: 0.9 mg/dL (ref 0.2–1.2)
BUN: 14 mg/dL (ref 6–23)
CALCIUM: 9.5 mg/dL (ref 8.4–10.5)
CO2: 31 meq/L (ref 19–32)
Chloride: 102 mEq/L (ref 96–112)
Creatinine, Ser: 1.21 mg/dL (ref 0.40–1.50)
GFR: 77.45 mL/min (ref >60.00)
Glucose, Bld: 94 mg/dL (ref 70–99)
Potassium: 4.4 mEq/L (ref 3.5–5.1)
SODIUM: 140 meq/L (ref 135–145)
Total Protein: 7.2 g/dL (ref 6.0–8.3)

## 2016-09-11 MED ORDER — TRIAMTERENE-HCTZ 37.5-25 MG PO TABS
1.0000 | ORAL_TABLET | Freq: Every day | ORAL | 3 refills | Status: DC
Start: 2016-09-11 — End: 2017-09-03

## 2016-09-11 MED ORDER — AMLODIPINE BESYLATE 5 MG PO TABS: ORAL_TABLET | ORAL | 3 refills | Status: DC

## 2016-09-11 NOTE — Assessment & Plan Note (Signed)
Controlled on Maxzide and Amlodipine CMET today Medication refilled x 1 year

## 2016-09-11 NOTE — Assessment & Plan Note (Signed)
Avoid triggers Continue Zantac prn

## 2016-09-11 NOTE — Progress Notes (Signed)
Subjective:    Patient ID: Brian Matthews, male    DOB: 23-Jul-1950, 66 y.o.   MRN: [id number]  HPI  Pt presents to the clinical today for his annual exam. He is also due to follow up chronic conditions.  HTN: His BP today is 128/80. He is taking Amlodipine and Maxzide as prescribed. ECG from 11/2013 reviewed. He is requesting refills of his medications today.  Hyperthyroidism: He is following with Dr. Loanne Drilling. His levels are returning to normal so Dr. Loanne Drilling felt that his hyperthyroidism was related to a viral illness.  GERD: Intermittent. He takes Zantac as needed with good relief.   Flu: 02/2016 Tetanus: 11/2008 Pneumovax: 07/2008 Zostavax: 09/2015 PSA Screening: every 2 years Colon Screening: 2011, every 5 years Vision Screening: 07/2016 Dentist: annually  Diet: He does eat seafood but no other meat. He consumes fruits and veggies daily. He occasionally eats fried foods. He drinks mostly water, juice. Exercise: He does exercises at home for an hour 4 x week.  Review of Systems      Past Medical History:  Diagnosis Date  . Childhood asthma   . History of blood transfusion   . Hypertension   . Hyperthyroidism   . Positive TB test     Current Outpatient Prescriptions  Medication Sig Dispense Refill  . amLODipine (NORVASC) 5 MG tablet TAKE 1 TABLET(5 MG) BY MOUTH DAILY 30 tablet 3  . Cholecalciferol (VITAMIN D-3 PO) Take 1 capsule by mouth 2 (two) times a week.     . Cyanocobalamin (VITAMIN B-12 PO) Take 1 tablet by mouth 2 (two) times a week.     . Misc Natural Products (GLUCOS-CHONDROIT-MSM COMPLEX) TABS Take 3 tablets by mouth daily.    . Multiple Vitamin (MULTIVITAMIN) tablet Take 1 tablet by mouth daily.    . Omega-3 Fatty Acids (FISH OIL) 1000 MG CAPS Take 1 capsule by mouth 3 (three) times daily.    . Saw Palmetto 450 MG CAPS Take 3 capsules by mouth daily. Blended with Kelp    . triamterene-hydrochlorothiazide (MAXZIDE-25) 37.5-25 MG tablet Take 1 tablet by  mouth daily. 90 tablet 0   No current facility-administered medications for this visit.     Allergies  Allergen Reactions  . Lisinopril Other (See Comments)    cough  . Penicillins Other (See Comments)    Does not remember    Family History  Problem Relation Age of Onset  . Hypertension Mother   . Arthritis Father   . Cancer Sister     Colon  . Thyroid disease Neg Hx     Social History   Social History  . Marital status: Single    Spouse name: N/A  . Number of children: N/A  . Years of education: N/A   Occupational History  . Not on file.   Social History Main Topics  . Smoking status: Never Smoker  . Smokeless tobacco: Never Used  . Alcohol use No  . Drug use: No  . Sexual activity: Not on file   Other Topics Concern  . Not on file   Social History Narrative  . No narrative on file     Constitutional: Pt reports weight gain. Denies fever, malaise, fatigue, headache.  HEENT: Denies eye pain, eye redness, ear pain, ringing in the ears, wax buildup, runny nose, nasal congestion, bloody nose, or sore throat. Respiratory: Denies difficulty breathing, shortness of breath, cough or sputum production.   Cardiovascular: Denies chest pain, chest tightness, palpitations or swelling in the  hands or feet.  Gastrointestinal: Pt reports intermittent reflux. Denies abdominal pain, bloating, constipation, diarrhea or blood in the stool.  GU: Denies urgency, frequency, pain with urination, burning sensation, blood in urine, odor or discharge. Musculoskeletal: Denies decrease in range of motion, difficulty with gait, muscle pain or joint pain and swelling.  Skin: Pt reports dry skin. Denies redness, rashes, lesions or ulcercations.  Neurological: Denies dizziness, difficulty with memory, difficulty with speech or problems with balance and coordination.  Psych: Denies anxiety, depression, SI/HI.  No other specific complaints in a complete review of systems (except as listed in  HPI above).  Objective:   Physical Exam   BP 128/80   Pulse 76   Temp 97.7 F (36.5 C) (Oral)   Ht 6' 0.5" (1.842 m)   Wt 180 lb 8 oz (81.9 kg)   SpO2 97%   BMI 24.14 kg/m  Wt Readings from Last 3 Encounters:  09/11/16 180 lb 8 oz (81.9 kg)  07/21/16 182 lb (82.6 kg)  05/19/16 175 lb 3.2 oz (79.5 kg)    General: Appears his stated age, well developed, well nourished in NAD. Skin: Warm, very dry and intact.  HEENT: Head: normal shape and size; Eyes: sclera white, no icterus, conjunctiva pink, PERRLA and EOMs intact; Ears: Tm's gray and intact, normal light reflex; Throat/Mouth: Teeth present, mucosa pink and moist, no exudate, lesions or ulcerations noted.  Neck:  Neck supple, trachea midline. No masses, lumps or thyromegaly present.  Cardiovascular: Normal rate and rhythm. S1,S2 noted.  No murmur, rubs or gallops noted. No JVD or BLE edema. No carotid bruits noted. Pulmonary/Chest: Normal effort and positive vesicular breath sounds. No respiratory distress. No wheezes, rales or ronchi noted.  Abdomen: Soft and nontender. Normal bowel sounds. No distention or masses noted. Liver, spleen and kidneys non palpable. Musculoskeletal: Strength 5/5 BUE/BLE. No difficulty with gait.  Neurological: Alert and oriented. Cranial nerves II-XII grossly intact. Coordination normal.  Psychiatric: Mood and affect normal. Behavior is normal. Judgment and thought content normal.     BMET    Component Value Date/Time   NA 141 02/22/2016 1638   K 4.1 02/22/2016 1638   CL 103 02/22/2016 1638   CO2 23 02/22/2016 1638   GLUCOSE 90 02/22/2016 1638   BUN 18 02/22/2016 1638   CREATININE 1.18 02/22/2016 1638   CALCIUM 9.4 02/22/2016 1638    Lipid Panel  No results found for: CHOL, TRIG, HDL, CHOLHDL, VLDL, LDLCALC  CBC    Component Value Date/Time   WBC 5.4 02/22/2016 1638   RBC 5.11 02/22/2016 1638   HGB 15.4 02/22/2016 1638   HCT 44.6 02/22/2016 1638   PLT 181 02/22/2016 1638   MCV  87.3 02/22/2016 1638   MCH 30.1 02/22/2016 1638   MCHC 34.5 02/22/2016 1638   RDW 13.6 02/22/2016 1638    Hgb A1C Lab Results  Component Value Date   HGBA1C 5.3 02/22/2016           Assessment & Plan:   Preventative Health Maintenance:  Flu and tetanus UTD He will get pneumovax at 89 and Prevnar at 48 Zostavax UTD He declines colonoscopy and cologuard at this time Encouraged him to consume a balanced diet and exercise regimen Advised him to see an eye doctor and dentist annually Will check CBC, CMET, Lipid and PSA today  RTC in 1 year, sooner if needed Webb Silversmith, NP

## 2016-09-11 NOTE — Patient Instructions (Signed)
 Health Maintenance, Male A healthy lifestyle and preventive care is important for your health and wellness. Ask your health care provider about what schedule of regular examinations is right for you. What should I know about weight and diet?  Eat a Healthy Diet  Eat plenty of vegetables, fruits, whole grains, low-fat dairy products, and lean protein.  Do not eat a lot of foods high in solid fats, added sugars, or salt. Maintain a Healthy Weight  Regular exercise can help you achieve or maintain a healthy weight. You should:  Do at least 150 minutes of exercise each week. The exercise should increase your heart rate and make you sweat (moderate-intensity exercise).  Do strength-training exercises at least twice a week. Watch Your Levels of Cholesterol and Blood Lipids  Have your blood tested for lipids and cholesterol every 5 years starting at 65 years of age. If you are at high risk for heart disease, you should start having your blood tested when you are 65 years old. You may need to have your cholesterol levels checked more often if:  Your lipid or cholesterol levels are high.  You are older than 65 years of age.  You are at high risk for heart disease. What should I know about cancer screening? Many types of cancers can be detected early and may often be prevented. Lung Cancer  You should be screened every year for lung cancer if:  You are a current smoker who has smoked for at least 30 years.  You are a former smoker who has quit within the past 15 years.  Talk to your health care provider about your screening options, when you should start screening, and how often you should be screened. Colorectal Cancer  Routine colorectal cancer screening usually begins at 65 years of age and should be repeated every 5-10 years until you are 65 years old. You may need to be screened more often if early forms of precancerous polyps or small growths are found. Your health care provider  may recommend screening at an earlier age if you have risk factors for colon cancer.  Your health care provider may recommend using home test kits to check for hidden blood in the stool.  A small camera at the end of a tube can be used to examine your colon (sigmoidoscopy or colonoscopy). This checks for the earliest forms of colorectal cancer. Prostate and Testicular Cancer  Depending on your age and overall health, your health care provider may do certain tests to screen for prostate and testicular cancer.  Talk to your health care provider about any symptoms or concerns you have about testicular or prostate cancer. Skin Cancer  Check your skin from head to toe regularly.  Tell your health care provider about any new moles or changes in moles, especially if:  There is a change in a mole's size, shape, or color.  You have a mole that is larger than a pencil eraser.  Always use sunscreen. Apply sunscreen liberally and repeat throughout the day.  Protect yourself by wearing long sleeves, pants, a wide-brimmed hat, and sunglasses when outside. What should I know about heart disease, diabetes, and high blood pressure?  If you are 18-39 years of age, have your blood pressure checked every 3-5 years. If you are 40 years of age or older, have your blood pressure checked every year. You should have your blood pressure measured twice-once when you are at a hospital or clinic, and once when you are not at   a hospital or clinic. Record the average of the two measurements. To check your blood pressure when you are not at a hospital or clinic, you can use:  An automated blood pressure machine at a pharmacy.  A home blood pressure monitor.  Talk to your health care provider about your target blood pressure.  If you are between 45-79 years old, ask your health care provider if you should take aspirin to prevent heart disease.  Have regular diabetes screenings by checking your fasting blood sugar  level.  If you are at a normal weight and have a low risk for diabetes, have this test once every three years after the age of 45.  If you are overweight and have a high risk for diabetes, consider being tested at a younger age or more often.  A one-time screening for abdominal aortic aneurysm (AAA) by ultrasound is recommended for men aged 65-75 years who are current or former smokers. What should I know about preventing infection? Hepatitis B  If you have a higher risk for hepatitis B, you should be screened for this virus. Talk with your health care provider to find out if you are at risk for hepatitis B infection. Hepatitis C  Blood testing is recommended for:  Everyone born from 1945 through 1965.  Anyone with known risk factors for hepatitis C. Sexually Transmitted Diseases (STDs)  You should be screened each year for STDs including gonorrhea and chlamydia if:  You are sexually active and are younger than 65 years of age.  You are older than 65 years of age and your health care provider tells you that you are at risk for this type of infection.  Your sexual activity has changed since you were last screened and you are at an increased risk for chlamydia or gonorrhea. Ask your health care provider if you are at risk.  Talk with your health care provider about whether you are at high risk of being infected with HIV. Your health care provider may recommend a prescription medicine to help prevent HIV infection. What else can I do?  Schedule regular health, dental, and eye exams.  Stay current with your vaccines (immunizations).  Do not use any tobacco products, such as cigarettes, chewing tobacco, and e-cigarettes. If you need help quitting, ask your health care provider.  Limit alcohol intake to no more than 2 drinks per day. One drink equals 12 ounces of beer, 5 ounces of wine, or 1 ounces of hard liquor.  Do not use street drugs.  Do not share needles.  Ask your health  care provider for help if you need support or information about quitting drugs.  Tell your health care provider if you often feel depressed.  Tell your health care provider if you have ever been abused or do not feel safe at home. This information is not intended to replace advice given to you by your health care provider. Make sure you discuss any questions you have with your health care provider. Document Released: 11/29/2007 Document Revised: 01/30/2016 Document Reviewed: 03/06/2015 Elsevier Interactive Patient Education  2017 Elsevier Inc.  

## 2016-09-11 NOTE — Assessment & Plan Note (Signed)
Levels have normalized He will continue to follow with Dr. Loanne Drilling

## 2016-09-13 ENCOUNTER — Encounter: Payer: Self-pay | Admitting: Internal Medicine

## 2016-10-16 ENCOUNTER — Other Ambulatory Visit: Payer: Self-pay | Admitting: Internal Medicine

## 2016-10-16 ENCOUNTER — Encounter: Payer: Self-pay | Admitting: Internal Medicine

## 2016-10-16 DIAGNOSIS — I10 Essential (primary) hypertension: Secondary | ICD-10-CM

## 2016-10-17 ENCOUNTER — Other Ambulatory Visit: Payer: Self-pay | Admitting: Internal Medicine

## 2016-10-17 MED ORDER — AMLODIPINE BESYLATE 5 MG PO TABS: ORAL_TABLET | ORAL | 3 refills | Status: DC

## 2016-10-17 NOTE — Addendum Note (Signed)
Addended by: Lurlean Nanny on: 10/17/2016 10:08 AM   Modules accepted: Orders

## 2016-11-18 ENCOUNTER — Encounter: Payer: Self-pay | Admitting: Endocrinology

## 2016-11-18 ENCOUNTER — Ambulatory Visit (INDEPENDENT_AMBULATORY_CARE_PROVIDER_SITE_OTHER): Payer: Medicare Other | Admitting: Endocrinology

## 2016-11-18 DIAGNOSIS — E061 Subacute thyroiditis: Secondary | ICD-10-CM | POA: Insufficient documentation

## 2016-11-18 LAB — TSH: TSH: 2.78 u[IU]/mL (ref 0.35–4.50)

## 2016-11-18 LAB — T4, FREE: FREE T4: 0.8 ng/dL (ref 0.60–1.60)

## 2016-11-18 NOTE — Progress Notes (Signed)
Subjective:    Patient ID: Brian Matthews, male    DOB: Mar 28, 1951, 66 y.o.   MRN: [id number]  HPI Pt returns for f/u of hyperthyroidism due to subacute thyroiditis (dx'ed 2017; nuc med scan showed very low uptake; f/u TFT were much better without rx; he has never been on medication for this).  pt states she feels well in general.   Past Medical History:  Diagnosis Date  . Childhood asthma   . History of blood transfusion   . Hypertension   . Hyperthyroidism   . Positive TB test     No past surgical history on file.  Social History   Social History  . Marital status: Single    Spouse name: N/A  . Number of children: N/A  . Years of education: N/A   Occupational History  . Not on file.   Social History Main Topics  . Smoking status: Never Smoker  . Smokeless tobacco: Never Used  . Alcohol use No  . Drug use: No  . Sexual activity: Not on file   Other Topics Concern  . Not on file   Social History Narrative  . No narrative on file    Current Outpatient Prescriptions on File Prior to Visit  Medication Sig Dispense Refill  . amLODipine (NORVASC) 5 MG tablet TAKE 1 TABLET(5 MG) BY MOUTH DAILY 90 tablet 3  . Cholecalciferol (VITAMIN D-3 PO) Take 1 capsule by mouth 2 (two) times a week.     . Cyanocobalamin (VITAMIN B-12 PO) Take 1 tablet by mouth 2 (two) times a week.     . Misc Natural Products (GLUCOS-CHONDROIT-MSM COMPLEX) TABS Take 3 tablets by mouth daily.    . Multiple Vitamin (MULTIVITAMIN) tablet Take 1 tablet by mouth daily.    . Omega-3 Fatty Acids (FISH OIL) 1000 MG CAPS Take 1 capsule by mouth 3 (three) times daily.    . ranitidine (ZANTAC) 150 MG tablet Take 150 mg by mouth as needed for heartburn.    . Saw Palmetto 450 MG CAPS Take 3 capsules by mouth daily. Blended with Kelp    . triamterene-hydrochlorothiazide (MAXZIDE-25) 37.5-25 MG tablet Take 1 tablet by mouth daily. 90 tablet 3   No current facility-administered medications on file prior to  visit.     Allergies  Allergen Reactions  . Lisinopril Other (See Comments)    cough  . Penicillins Other (See Comments)    Does not remember    Family History  Problem Relation Age of Onset  . Hypertension Mother   . Arthritis Father   . Cancer Sister        Colon  . Thyroid disease Neg Hx     BP (!) 148/78 (BP Location: Left Arm, Patient Position: Sitting, Cuff Size: Normal)   Pulse 71   Temp 97.7 F (36.5 C) (Oral)   Ht 6' 0.5" (1.842 m)   Wt 179 lb 6.4 oz (81.4 kg)   SpO2 97%   BMI 24.00 kg/m    Review of Systems He has lost a few lbs.      Objective:   Physical Exam VITAL SIGNS:  See vs page GENERAL: no distress NECK: There is no palpable thyroid enlargement.  No thyroid nodule is palpable.  No palpable lymphadenopathy at the anterior neck.       Assessment & Plan:  Subacute thyroiditis, due for recheck.    Patient Instructions  Thyroid blood tests are requested for you today.  We'll let you know about the  results.   Please come back for a follow-up appointment in 6 months.

## 2016-11-18 NOTE — Patient Instructions (Addendum)
Thyroid blood tests are requested for you today.  We'll let you know about the results.  Please come back for a follow-up appointment in 6 months.   

## 2017-01-31 ENCOUNTER — Encounter: Payer: Self-pay | Admitting: Internal Medicine

## 2017-02-02 ENCOUNTER — Telehealth: Payer: Self-pay

## 2017-02-02 ENCOUNTER — Other Ambulatory Visit: Payer: Self-pay | Admitting: Internal Medicine

## 2017-02-02 DIAGNOSIS — Z1211 Encounter for screening for malignant neoplasm of colon: Secondary | ICD-10-CM

## 2017-02-02 NOTE — Telephone Encounter (Signed)
I have put in referral to GI, pt aware through [facility]

## 2017-02-02 NOTE — Telephone Encounter (Signed)
Please advise 

## 2017-02-02 NOTE — Telephone Encounter (Signed)
-----   Message from Candise Bowens sent at 02/02/2017 11:28 AM EDT ----- PT sent this msg thru MyChart- does he need an appt or will it be addressed at March cpe? During the last appointment that I had (March 2018), the colonoscopy was brought up by the provider.  It has also been listed in [place] for some time (over a year, actually) that it is an overdue procedure.  I had one 8 or 9 years ago when I lived in another area; also one of my siblings had colon cancer years ago, so there is the family history thing.  Not having any problems that I am aware of, but that doesn't necessarily mean that something couldn't be starting.  Thanks.  Brian Matthews

## 2017-02-10 ENCOUNTER — Telehealth: Payer: Self-pay | Admitting: Internal Medicine

## 2017-02-20 NOTE — Telephone Encounter (Signed)
Dr.Gessner reviewed records and accepted for patient to be scheduled for a direct colonoscopy. Patient has been notified of this and has been scheduled for direct colonoscopy in [place].

## 2017-02-20 NOTE — Telephone Encounter (Signed)
Spoke with patient states his sister was diagnosed at age 65. Please advise as to scheduling.

## 2017-03-16 ENCOUNTER — Telehealth: Payer: Self-pay | Admitting: Internal Medicine

## 2017-03-16 NOTE — Telephone Encounter (Signed)
Caller Name:Osm[place]  Relationship to Patient:self Best [phone] Pharmacy:  Reason for call: pt would like to have high dose (senior) vaccine due to news reports that he has seen.  Does this pt need to have the high dose?  Pt request cb

## 2017-03-17 NOTE — Telephone Encounter (Signed)
He does not need the high dose but if he wants it he can have it.

## 2017-03-17 NOTE — Telephone Encounter (Signed)
msg sent via my chart

## 2017-03-19 ENCOUNTER — Encounter: Payer: Self-pay | Admitting: Internal Medicine

## 2017-03-26 ENCOUNTER — Ambulatory Visit: Payer: Medicare Other | Admitting: Primary Care

## 2017-03-26 ENCOUNTER — Ambulatory Visit: Payer: Medicare Other

## 2017-03-26 ENCOUNTER — Ambulatory Visit (INDEPENDENT_AMBULATORY_CARE_PROVIDER_SITE_OTHER): Payer: Medicare Other | Admitting: Primary Care

## 2017-03-26 ENCOUNTER — Encounter: Payer: Self-pay | Admitting: Primary Care

## 2017-03-26 VITALS — BP 124/70 | HR 76 | Temp 98.5°F | Ht 72.5 in | Wt 165.0 lb

## 2017-03-26 DIAGNOSIS — K649 Unspecified hemorrhoids: Secondary | ICD-10-CM

## 2017-03-26 DIAGNOSIS — Z23 Encounter for immunization: Secondary | ICD-10-CM | POA: Diagnosis not present

## 2017-03-26 DIAGNOSIS — K648 Other hemorrhoids: Secondary | ICD-10-CM

## 2017-03-26 DIAGNOSIS — K644 Residual hemorrhoidal skin tags: Secondary | ICD-10-CM | POA: Diagnosis not present

## 2017-03-26 MED ORDER — HYDROCORTISONE ACETATE 25 MG RE SUPP: 25.0000 mg | Freq: Two times a day (BID) | RECTAL | 0 refills | Status: DC

## 2017-03-26 NOTE — Progress Notes (Signed)
Subjective:    Patient ID: Brian Matthews, male    DOB: [birth year], 66 y.o.   MRN: [id number]  HPI  Brian Matthews is a 66 year old male with a history of hemorrhoids who presents today with a chief complaint of rectal discomfort. He has noticed a change in his bowel movements as they are more firm/lumpy and he's had to recently strain. He has noticed a small stripe of bright red blood when wiping the anus.   He's used preporation H and lidocaine spray with temporary improvement. He denies fevers, unexplained weight loss, changes in his diet. He endorses that he drinks plenty of water, regularly exercises, and eats a healthy diet. His colonoscopy is due in November this year, last colonoscopy was unremarkable.   Review of Systems  Constitutional: Negative for fever and unexpected weight change.  Gastrointestinal: Positive for blood in stool and constipation. Negative for abdominal pain, diarrhea and nausea.  Neurological: Negative for weakness.       Past Medical History:  Diagnosis Date  . Childhood asthma   . History of blood transfusion   . Hypertension   . Hyperthyroidism   . Positive TB test      Social History   Social History  . Marital status: Single    Spouse name: N/A  . Number of children: N/A  . Years of education: N/A   Occupational History  . Not on file.   Social History Main Topics  . Smoking status: Never Smoker  . Smokeless tobacco: Never Used  . Alcohol use No  . Drug use: No  . Sexual activity: Not on file   Other Topics Concern  . Not on file   Social History Narrative  . No narrative on file    No past surgical history on file.  Family History  Problem Relation Age of Onset  . Hypertension Mother   . Arthritis Father   . Cancer Sister        Colon  . Thyroid disease Neg Hx     Allergies  Allergen Reactions  . Lisinopril Other (See Comments)    cough  . Penicillins Other (See Comments)    Does not remember    Current  Outpatient Prescriptions on File Prior to Visit  Medication Sig Dispense Refill  . amLODipine (NORVASC) 5 MG tablet TAKE 1 TABLET(5 MG) BY MOUTH DAILY 90 tablet 3  . Cholecalciferol (VITAMIN D-3 PO) Take 1 capsule by mouth 2 (two) times a week.     . Cyanocobalamin (VITAMIN B-12 PO) Take 1 tablet by mouth 2 (two) times a week.     . Misc Natural Products (GLUCOS-CHONDROIT-MSM COMPLEX) TABS Take 3 tablets by mouth daily.    . Multiple Vitamin (MULTIVITAMIN) tablet Take 1 tablet by mouth daily.    . Omega-3 Fatty Acids (FISH OIL) 1000 MG CAPS Take 1 capsule by mouth 3 (three) times daily.    . ranitidine (ZANTAC) 150 MG tablet Take 150 mg by mouth as needed for heartburn.    . Saw Palmetto 450 MG CAPS Take 3 capsules by mouth daily. Blended with Kelp    . triamterene-hydrochlorothiazide (MAXZIDE-25) 37.5-25 MG tablet Take 1 tablet by mouth daily. 90 tablet 3   No current facility-administered medications on file prior to visit.     BP 124/70   Pulse 76   Temp 98.5 F (36.9 C) (Oral)   Ht 6' 0.5" (1.842 m)   Wt 165 lb (74.8 kg)   SpO2 98%  BMI 22.07 kg/m    Objective:   Physical Exam  Constitutional: He appears well-nourished.  Neck: Neck supple.  Cardiovascular: Normal rate.   Pulmonary/Chest: Effort normal.  Genitourinary: Rectal exam shows external hemorrhoid and internal hemorrhoid. Rectal exam shows no fissure.  Genitourinary Comments: Firm stool noted to the rectum, no impaction. 1.5 linear non thrombosed hemorrhoid noted to rectum at 1 o'clock position. Internal hemorrhoid noted. No obvious bleeding.  Skin: Skin is warm.          Assessment & Plan:  Internal and External Hemorrhoid:  Noted on exam. No impaction, but firm stool noted. Discussed to increase fiber, ensure proper hydration. Rx for Anusol suppositories sent to pharmacy.  He will follow up with GI next month for colonoscopy.  Sheral Flow, NP

## 2017-03-26 NOTE — Patient Instructions (Signed)
Insert the suppository twice daily for 6 days.  Please follow up with Rollene Fare if no improvement after treatment.  Consider purchasing a donut pillow to reduce pressure.   Ensure you are consuming 64 ounces of water daily. Ensure you are getting 30 grams of fiber daily.  It was a pleasure meeting you!   High-Fiber Diet Fiber, also called dietary fiber, is a type of carbohydrate found in fruits, vegetables, whole grains, and beans. A high-fiber diet can have many health benefits. Your health care provider may recommend a high-fiber diet to help:  Prevent constipation. Fiber can make your bowel movements more regular.  Lower your cholesterol.  Relieve hemorrhoids, uncomplicated diverticulosis, or irritable bowel syndrome.  Prevent overeating as part of a weight-loss plan.  Prevent heart disease, type 2 diabetes, and certain cancers.  What is my plan? The recommended daily intake of fiber includes:  38 grams for men under age 93.  78 grams for men over age 33.  97 grams for women under age 36.  49 grams for women over age 41.  You can get the recommended daily intake of dietary fiber by eating a variety of fruits, vegetables, grains, and beans. Your health care provider may also recommend a fiber supplement if it is not possible to get enough fiber through your diet. What do I need to know about a high-fiber diet?  Fiber supplements have not been widely studied for their effectiveness, so it is better to get fiber through food sources.  Always check the fiber content on thenutrition facts label of any prepackaged food. Look for foods that contain at least 5 grams of fiber per serving.  Ask your dietitian if you have questions about specific foods that are related to your condition, especially if those foods are not listed in the following section.  Increase your daily fiber consumption gradually. Increasing your intake of dietary fiber too quickly may cause bloating,  cramping, or gas.  Drink plenty of water. Water helps you to digest fiber. What foods can I eat? Grains Whole-grain breads. Multigrain cereal. Oats and oatmeal. Brown rice. Barley. Bulgur wheat. [place]. Bran muffins. Popcorn. Rye wafer crackers. Vegetables Sweet potatoes. Spinach. Kale. Artichokes. Cabbage. Broccoli. Green peas. Carrots. Squash. Fruits Berries. Pears. Apples. Oranges. Avocados. Prunes and raisins. Dried figs. Meats and Other Protein Sources [place], kidney, pinto, and soy beans. Split peas. Lentils. Nuts and seeds. Dairy Fiber-fortified yogurt. Beverages Fiber-fortified soy milk. Fiber-fortified orange juice. Other Fiber bars. The items listed above may not be a complete list of recommended foods or beverages. Contact your dietitian for more options. What foods are not recommended? Grains White bread. Pasta made with refined flour. White rice. Vegetables Fried potatoes. Canned vegetables. Well-cooked vegetables. Fruits Fruit juice. Cooked, strained fruit. Meats and Other Protein Sources Fatty cuts of meat. Fried Sales executive or fried fish. Dairy Milk. Yogurt. Cream cheese. Sour cream. Beverages Soft drinks. Other Cakes and pastries. Butter and oils. The items listed above may not be a complete list of foods and beverages to avoid. Contact your dietitian for more information. What are some tips for including high-fiber foods in my diet?  Eat a wide variety of high-fiber foods.  Make sure that half of all grains consumed each day are whole grains.  Replace breads and cereals made from refined flour or white flour with whole-grain breads and cereals.  Replace white rice with brown rice, bulgur wheat, or millet.  Start the day with a breakfast that is high in fiber, such as  a cereal that contains at least 5 grams of fiber per serving.  Use beans in place of meat in soups, salads, or pasta.  Eat high-fiber snacks, such as berries, raw vegetables, nuts, or  popcorn. This information is not intended to replace advice given to you by your health care provider. Make sure you discuss any questions you have with your health care provider. Document Released: 06/02/2005 Document Revised: 11/08/2015 Document Reviewed: 11/15/2013 Elsevier Interactive Patient Education  2017 [employer].

## 2017-03-26 NOTE — Addendum Note (Signed)
Addended by: Jacqualin Combes on: 03/26/2017 01:17 PM   Modules accepted: Orders

## 2017-04-09 ENCOUNTER — Ambulatory Visit (AMBULATORY_SURGERY_CENTER): Payer: Self-pay

## 2017-04-09 VITALS — Ht 72.0 in | Wt 167.2 lb

## 2017-04-09 DIAGNOSIS — Z800 Family history of malignant neoplasm of digestive organs: Secondary | ICD-10-CM

## 2017-04-09 NOTE — Progress Notes (Signed)
Denies allergies to eggs or soy products. Denies complication of anesthesia or sedation. Denies use of weight loss medication. Denies use of O2.   Emmi instructions declined.  

## 2017-04-16 HISTORY — PX: COLONOSCOPY: SHX174

## 2017-04-17 ENCOUNTER — Encounter: Payer: Self-pay | Admitting: Internal Medicine

## 2017-04-23 ENCOUNTER — Ambulatory Visit (AMBULATORY_SURGERY_CENTER): Payer: Medicare Other | Admitting: Internal Medicine

## 2017-04-23 ENCOUNTER — Other Ambulatory Visit: Payer: Self-pay

## 2017-04-23 ENCOUNTER — Encounter: Payer: Self-pay | Admitting: Internal Medicine

## 2017-04-23 VITALS — BP 122/71 | HR 57 | Temp 99.5°F | Resp 13 | Ht 72.0 in | Wt 165.0 lb

## 2017-04-23 DIAGNOSIS — D123 Benign neoplasm of transverse colon: Secondary | ICD-10-CM | POA: Diagnosis not present

## 2017-04-23 DIAGNOSIS — Z1211 Encounter for screening for malignant neoplasm of colon: Secondary | ICD-10-CM

## 2017-04-23 DIAGNOSIS — Z1212 Encounter for screening for malignant neoplasm of rectum: Secondary | ICD-10-CM

## 2017-04-23 DIAGNOSIS — Z800 Family history of malignant neoplasm of digestive organs: Secondary | ICD-10-CM

## 2017-04-23 MED ORDER — SODIUM CHLORIDE 0.9 % IV SOLN: 500.0000 mL | INTRAVENOUS | Status: DC

## 2017-04-23 NOTE — Progress Notes (Signed)
Pt's states no medical or surgical changes since previsit or office visit. 

## 2017-04-23 NOTE — Progress Notes (Signed)
A/ox3 pleased with MAC, report to Celia RN 

## 2017-04-23 NOTE — Op Note (Signed)
[facility] Patient Name: Brian Matthews Procedure Date: 04/23/2017 11:04 AM MRN: [id number] Endoscopist: Gatha Mayer , MD Age: 66 Referring MD:  Date of Birth: [birth year] Gender: Male Account #: [id number] Procedure:                Colonoscopy Indications:              Colon cancer screening in patient at increased                            risk: Colorectal cancer in sister Medicines:                Propofol per Anesthesia, Monitored Anesthesia Care Procedure:                Pre-Anesthesia Assessment:                           - Prior to the procedure, a History and Physical                            was performed, and patient medications and                            allergies were reviewed. The patient's tolerance of                            previous anesthesia was also reviewed. The risks                            and benefits of the procedure and the sedation                            options and risks were discussed with the patient.                            All questions were answered, and informed consent                            was obtained. Prior Anticoagulants: The patient has                            taken no previous anticoagulant or antiplatelet                            agents. ASA Grade Assessment: II - A patient with                            mild systemic disease. After reviewing the risks                            and benefits, the patient was deemed in                            satisfactory condition to undergo the procedure.  After obtaining informed consent, the colonoscope                            was passed under direct vision. Throughout the                            procedure, the patient's blood pressure, pulse, and                            oxygen saturations were monitored continuously. The                            Colonoscope was introduced through the anus and   advanced to the the cecum, identified by                            appendiceal orifice and ileocecal valve. The                            colonoscopy was performed without difficulty. The                            patient tolerated the procedure well. The quality                            of the bowel preparation was good. The bowel                            preparation used was Miralax. The ileocecal valve,                            appendiceal orifice, and rectum were photographed. Scope In: 11:11:03 AM Scope Out: 11:23:42 AM Scope Withdrawal Time: 0 hours 9 minutes 13 seconds  Total Procedure Duration: 0 hours 12 minutes 39 seconds  Findings:                 The perianal and digital rectal examinations were                            normal. Pertinent negatives include normal prostate                            (size, shape, and consistency).                           Two sessile polyps were found in the transverse                            colon. The polyps were diminutive in size. These                            polyps were removed with a cold snare. Resection                            and  retrieval were complete. Verification of                            patient identification for the specimen was done.                            Estimated blood loss was minimal.                           A diffuse area of moderate melanosis was found in                            the entire colon.                           The exam was otherwise without abnormality on                            direct and retroflexion views. Complications:            No immediate complications. Estimated Blood Loss:     Estimated blood loss was minimal. Impression:               - Two diminutive polyps in the transverse colon,                            removed with a cold snare. Resected and retrieved.                           - Melanosis in the colon.                           - The examination was  otherwise normal on direct                            and retroflexion views. Recommendation:           - Patient has a contact number available for                            emergencies. The signs and symptoms of potential                            delayed complications were discussed with the                            patient. Return to normal activities tomorrow.                            Written discharge instructions were provided to the                            patient.                           - Resume previous diet.                           -  Continue present medications.                           - Repeat colonoscopy in 5 years.                           - FHx CRCA sister dx age 82 Gatha Mayer, MD 04/23/2017 11:32:10 AM This report has been signed electronically.

## 2017-04-23 NOTE — Progress Notes (Signed)
Called to room to assist during endoscopic procedure.  Patient ID and intended procedure confirmed with present staff. Received instructions for my participation in the procedure from the performing physician.  

## 2017-04-23 NOTE — Patient Instructions (Addendum)
I found and removed 2 tiny polyps. No other significant findings. You do have melanosis coli - staining of colon tissue usually from laxatives that contain senna. It is not a problem.  I will let you know pathology results and when to have another routine colonoscopy by mail and/or My Chart.  I appreciate the opportunity to care for you. Gatha Mayer, MD, [facility]   Discharge instructions given. Handout on polyps. Resume previous medications. YOU HAD AN ENDOSCOPIC PROCEDURE TODAY AT [facility]:   Refer to the procedure report that was given to you for any specific questions about what was found during the examination.  If the procedure report does not answer your questions, please call your gastroenterologist to clarify.  If you requested that your care partner not be given the details of your procedure findings, then the procedure report has been included in a sealed envelope for you to review at your convenience later.  YOU SHOULD EXPECT: Some feelings of bloating in the abdomen. Passage of more gas than usual.  Walking can help get rid of the air that was put into your GI tract during the procedure and reduce the bloating. If you had a lower endoscopy (such as a colonoscopy or flexible sigmoidoscopy) you may notice spotting of blood in your stool or on the toilet paper. If you underwent a bowel prep for your procedure, you may not have a normal bowel movement for a few days.  Please Note:  You might notice some irritation and congestion in your nose or some drainage.  This is from the oxygen used during your procedure.  There is no need for concern and it should clear up in a day or so.  SYMPTOMS TO REPORT IMMEDIATELY:   Following lower endoscopy (colonoscopy or flexible sigmoidoscopy):  Excessive amounts of blood in the stool  Significant tenderness or worsening of abdominal pains  Swelling of the abdomen that is new, acute  Fever of 100F or higher   For  urgent or emergent issues, a gastroenterologist can be reached at any hour by calling [phone].   DIET:  We do recommend a small meal at first, but then you may proceed to your regular diet.  Drink plenty of fluids but you should avoid alcoholic beverages for 24 hours.  ACTIVITY:  You should plan to take it easy for the rest of today and you should NOT DRIVE or use heavy machinery until tomorrow (because of the sedation medicines used during the test).    FOLLOW UP: Our staff will call the number listed on your records the next business day following your procedure to check on you and address any questions or concerns that you may have regarding the information given to you following your procedure. If we do not reach you, we will leave a message.  However, if you are feeling well and you are not experiencing any problems, there is no need to return our call.  We will assume that you have returned to your regular daily activities without incident.  If any biopsies were taken you will be contacted by phone or by letter within the next 1-3 weeks.  Please call us at [phone] if you have not heard about the biopsies in 3 weeks.    SIGNATURES/CONFIDENTIALITY: You and/or your care partner have signed paperwork which will be entered into your electronic medical record.  These signatures attest to the fact that that the information above on your After Visit Summary  has been reviewed and is understood.  Full responsibility of the confidentiality of this discharge information lies with you and/or your care-partner.  

## 2017-04-24 ENCOUNTER — Telehealth: Payer: Self-pay

## 2017-04-24 NOTE — Telephone Encounter (Signed)
Left message on answering machine. 

## 2017-04-29 ENCOUNTER — Encounter: Payer: Self-pay | Admitting: Internal Medicine

## 2017-04-29 DIAGNOSIS — Z860101 Personal history of adenomatous and serrated colon polyps: Secondary | ICD-10-CM

## 2017-04-29 DIAGNOSIS — Z86010 Personal history of colonic polyps: Secondary | ICD-10-CM

## 2017-04-29 HISTORY — DX: Personal history of colonic polyps: Z86.010

## 2017-04-29 HISTORY — DX: Personal history of adenomatous and serrated colon polyps: Z86.0101

## 2017-04-29 NOTE — Progress Notes (Signed)
Diminutive adenoma + mucosal polyp Recall 5 yrs - also has FHx CRCA My Chart letter

## 2017-04-30 ENCOUNTER — Encounter: Payer: Self-pay | Admitting: Internal Medicine

## 2017-05-20 ENCOUNTER — Ambulatory Visit (INDEPENDENT_AMBULATORY_CARE_PROVIDER_SITE_OTHER): Payer: Medicare Other | Admitting: Endocrinology

## 2017-05-20 ENCOUNTER — Encounter: Payer: Self-pay | Admitting: Endocrinology

## 2017-05-20 VITALS — BP 118/70 | HR 60 | Wt 166.8 lb

## 2017-05-20 DIAGNOSIS — E061 Subacute thyroiditis: Secondary | ICD-10-CM

## 2017-05-20 LAB — TSH: TSH: 3.61 u[IU]/mL (ref 0.35–4.50)

## 2017-05-20 NOTE — Progress Notes (Signed)
Subjective:    Patient ID: Brian Matthews, male    DOB: 03-Feb-1951, 66 y.o.   MRN: [id number]  HPI Pt returns for f/u of hyperthyroidism due to subacute thyroiditis (dx'ed 2017; nuc med scan showed very low uptake; TFT normalized without rx; he has never been on thyroid medication).  pt states he feels well in general.   Past Medical History:  Diagnosis Date  . Allergy   . Childhood asthma   . History of blood transfusion   . Hx of adenomatous polyp of colon 04/29/2017  . Hypertension   . Hyperthyroidism   . Positive TB test     History reviewed. No pertinent surgical history.  Social History   Socioeconomic History  . Marital status: Single    Spouse name: Not on file  . Number of children: Not on file  . Years of education: Not on file  . Highest education level: Not on file  Social Needs  . Financial resource strain: Not on file  . Food insecurity - worry: Not on file  . Food insecurity - inability: Not on file  . Transportation needs - medical: Not on file  . Transportation needs - non-medical: Not on file  Occupational History  . Not on file  Tobacco Use  . Smoking status: Never Smoker  . Smokeless tobacco: Never Used  Substance and Sexual Activity  . Alcohol use: No    Alcohol/week: 0.0 oz  . Drug use: No  . Sexual activity: Not on file  Other Topics Concern  . Not on file  Social History Narrative  . Not on file    Current Outpatient Medications on File Prior to Visit  Medication Sig Dispense Refill  . amLODipine (NORVASC) 5 MG tablet TAKE 1 TABLET(5 MG) BY MOUTH DAILY 90 tablet 3  . Cholecalciferol (VITAMIN D-3 PO) Take 1 capsule by mouth 2 (two) times a week.     . Cyanocobalamin (VITAMIN B-12 PO) Take 1 tablet by mouth 2 (two) times a week.     . hydrocortisone (ANUSOL-HC) 25 MG suppository Place 1 suppository (25 mg total) rectally 2 (two) times daily. 12 suppository 0  . Misc Natural Products (GLUCOS-CHONDROIT-MSM COMPLEX) TABS Take 3 tablets by  mouth daily.    . Multiple Vitamin (MULTIVITAMIN) tablet Take 1 tablet by mouth daily.    . Omega-3 Fatty Acids (FISH OIL) 1000 MG CAPS Take 1 capsule by mouth 3 (three) times daily.    Marland Kitchen OVER THE COUNTER MEDICATION Prostate Blend 1000 mg one tablet daily.    . Saw Palmetto 450 MG CAPS Take 3 capsules by mouth daily. Blended with Kelp    . triamterene-hydrochlorothiazide (MAXZIDE-25) 37.5-25 MG tablet Take 1 tablet by mouth daily. 90 tablet 3   No current facility-administered medications on file prior to visit.     Allergies  Allergen Reactions  . Lisinopril Other (See Comments)    cough  . Penicillins Other (See Comments)    Does not remember    Family History  Problem Relation Age of Onset  . Hypertension Mother   . Arthritis Father   . Cancer Sister        Colon  . Colon cancer Sister   . Thyroid disease Neg Hx   . Esophageal cancer Neg Hx   . Pancreatic cancer Neg Hx   . Rectal cancer Neg Hx   . Stomach cancer Neg Hx     BP 118/70 (BP Location: Left Arm, Patient Position: Sitting, Cuff Size: Normal)  Pulse 60   Wt 166 lb 12.8 oz (75.7 kg)   SpO2 97%   BMI 22.62 kg/m    Review of Systems Denies neck pain    Objective:   Physical Exam VITAL SIGNS:  See vs page GENERAL: no distress NECK: There is no palpable thyroid enlargement.  No thyroid nodule is palpable.  No palpable lymphadenopathy at the anterior neck.       Assessment & Plan:  Thyroiditis, due for recheck.   Patient Instructions  Thyroid blood tests are requested for you today.  We'll let you know about the results.   If it is normal, all you need to do is to have your blood test checked at [facility].   I would be happy to see you back here as needed.

## 2017-05-20 NOTE — Patient Instructions (Addendum)
Thyroid blood tests are requested for you today.  We'll let you know about the results.   If it is normal, all you need to do is to have your blood test checked at your annual checkup, with [facility].   I would be happy to see you back here as needed.

## 2017-07-07 ENCOUNTER — Encounter: Payer: Self-pay | Admitting: Internal Medicine

## 2017-07-08 ENCOUNTER — Telehealth: Payer: Self-pay | Admitting: Internal Medicine

## 2017-07-08 ENCOUNTER — Other Ambulatory Visit: Payer: Self-pay | Admitting: Internal Medicine

## 2017-07-08 NOTE — Telephone Encounter (Signed)
I called [facility] and was on hold for 5 minutes.Marland KitchenMarland Kitchen

## 2017-07-08 NOTE — Telephone Encounter (Unsigned)
Copied from [place]. Topic: Quick Communication - See Telephone Encounter >> Jul 08, 2017 12:19 PM Hewitt Shorts wrote: CRM for notification. See Telephone encounter for: pt is at the pharmacy regarding his amlodipine and they pharmacy has sent in a request for a refill on amlodipine but they have questions about his history of the refills  Before today  [facility] [phone] 07/08/17.

## 2017-08-05 DIAGNOSIS — J3489 Other specified disorders of nose and nasal sinuses: Secondary | ICD-10-CM | POA: Diagnosis not present

## 2017-08-10 DIAGNOSIS — J3489 Other specified disorders of nose and nasal sinuses: Secondary | ICD-10-CM | POA: Diagnosis not present

## 2017-08-10 DIAGNOSIS — R234 Changes in skin texture: Secondary | ICD-10-CM | POA: Diagnosis not present

## 2017-08-18 ENCOUNTER — Encounter: Payer: Self-pay | Admitting: Internal Medicine

## 2017-09-03 ENCOUNTER — Other Ambulatory Visit: Payer: Self-pay | Admitting: Internal Medicine

## 2017-09-03 DIAGNOSIS — I10 Essential (primary) hypertension: Secondary | ICD-10-CM

## 2017-09-10 DIAGNOSIS — J343 Hypertrophy of nasal turbinates: Secondary | ICD-10-CM | POA: Diagnosis not present

## 2017-09-10 DIAGNOSIS — J3489 Other specified disorders of nose and nasal sinuses: Secondary | ICD-10-CM | POA: Diagnosis not present

## 2017-09-10 DIAGNOSIS — J342 Deviated nasal septum: Secondary | ICD-10-CM | POA: Diagnosis not present

## 2017-09-15 ENCOUNTER — Ambulatory Visit: Payer: Medicare Other

## 2017-09-22 ENCOUNTER — Ambulatory Visit: Payer: Medicare Other

## 2017-09-24 ENCOUNTER — Ambulatory Visit (INDEPENDENT_AMBULATORY_CARE_PROVIDER_SITE_OTHER): Payer: Medicare Other | Admitting: Internal Medicine

## 2017-09-24 ENCOUNTER — Encounter: Payer: Self-pay | Admitting: Internal Medicine

## 2017-09-24 VITALS — BP 124/76 | HR 59 | Temp 97.7°F | Ht 72.5 in | Wt 173.0 lb

## 2017-09-24 DIAGNOSIS — Z125 Encounter for screening for malignant neoplasm of prostate: Secondary | ICD-10-CM

## 2017-09-24 DIAGNOSIS — I10 Essential (primary) hypertension: Secondary | ICD-10-CM

## 2017-09-24 DIAGNOSIS — Z23 Encounter for immunization: Secondary | ICD-10-CM | POA: Diagnosis not present

## 2017-09-24 DIAGNOSIS — Z8639 Personal history of other endocrine, nutritional and metabolic disease: Secondary | ICD-10-CM | POA: Diagnosis not present

## 2017-09-24 DIAGNOSIS — Z0000 Encounter for general adult medical examination without abnormal findings: Secondary | ICD-10-CM | POA: Diagnosis not present

## 2017-09-24 LAB — LIPID PANEL
CHOL/HDL RATIO: 5
Cholesterol: 168 mg/dL (ref 0–200)
HDL: 34 mg/dL — ABNORMAL LOW (ref >39.00)
LDL CALC: 113 mg/dL — AB (ref 0–99)
NONHDL: 133.59
TRIGLYCERIDES: 102 mg/dL (ref 0.0–149.0)
VLDL: 20.4 mg/dL (ref 0.0–40.0)

## 2017-09-24 LAB — TSH: TSH: 2.87 u[IU]/mL (ref 0.35–4.50)

## 2017-09-24 LAB — COMPREHENSIVE METABOLIC PANEL
ALBUMIN: 4.3 g/dL (ref 3.5–5.2)
ALK PHOS: 57 U/L (ref 39–117)
ALT: 14 U/L (ref 0–53)
AST: 17 U/L (ref 0–37)
BUN: 8 mg/dL (ref 6–23)
CO2: 31 mEq/L (ref 19–32)
Calcium: 9 mg/dL (ref 8.4–10.5)
Chloride: 102 mEq/L (ref 96–112)
Creatinine, Ser: 1.06 mg/dL (ref 0.40–1.50)
GFR: 89.94 mL/min (ref >60.00)
GLUCOSE: 85 mg/dL (ref 70–99)
POTASSIUM: 4.2 meq/L (ref 3.5–5.1)
SODIUM: 138 meq/L (ref 135–145)
TOTAL PROTEIN: 6.7 g/dL (ref 6.0–8.3)
Total Bilirubin: 0.8 mg/dL (ref 0.2–1.2)

## 2017-09-24 LAB — CBC
HEMATOCRIT: 45.7 % (ref 39.0–52.0)
Hemoglobin: 15.7 g/dL (ref 13.0–17.0)
MCHC: 34.4 g/dL (ref 30.0–36.0)
MCV: 89.6 fl (ref 78.0–100.0)
Platelets: 160 10*3/uL (ref 150.0–400.0)
RBC: 5.1 Mil/uL (ref 4.22–5.81)
RDW: 13.7 % (ref 11.5–15.5)
WBC: 3.7 10*3/uL — AB (ref 4.0–10.5)

## 2017-09-24 LAB — T4, FREE: Free T4: 0.74 ng/dL (ref 0.60–1.60)

## 2017-09-24 LAB — PSA, MEDICARE: PSA: 0.56 ng/ml (ref 0.10–4.00)

## 2017-09-24 NOTE — Progress Notes (Signed)
HPI:  Pt presents to the clinic today foe his Medicare Wellness Exam. He is also due to follow up chronic conditions.  Viral Thyroiditis: He follows with Dr.Ellison. Last TSH was normal. He is not taking any medications for his thyroid at this time.   HTN:  His BP today is 124/76. He is taking Amlodipine And Triamterene Hct daily as prescribed. ECG from 09/2017 reviewed.  Past Medical History:  Diagnosis Date  . Allergy   . Childhood asthma   . History of blood transfusion   . Hx of adenomatous polyp of colon 04/29/2017  . Hypertension   . Hyperthyroidism   . Positive TB test     Current Outpatient Medications  Medication Sig Dispense Refill  . amLODipine (NORVASC) 5 MG tablet TAKE 1 TABLET(5 MG) BY MOUTH DAILY 90 tablet 3  . Cholecalciferol (VITAMIN D-3 PO) Take 1 capsule by mouth 2 (two) times a week.     . Cyanocobalamin (VITAMIN B-12 PO) Take 1 tablet by mouth 2 (two) times a week.     . Misc Natural Products (GLUCOS-CHONDROIT-MSM COMPLEX) TABS Take 3 tablets by mouth daily.    . Multiple Vitamin (MULTIVITAMIN) tablet Take 1 tablet by mouth daily.    . Omega-3 Fatty Acids (FISH OIL) 1000 MG CAPS Take 1 capsule by mouth 3 (three) times daily.    Marland Kitchen OVER THE COUNTER MEDICATION Prostate Blend 1000 mg one tablet daily.    . Saw Palmetto 450 MG CAPS Take 3 capsules by mouth daily. Blended with Kelp    . triamterene-hydrochlorothiazide (MAXZIDE-25) 37.5-25 MG tablet TAKE 1 TABLET BY MOUTH DAILY 90 tablet 0   No current facility-administered medications for this visit.     Allergies  Allergen Reactions  . Lisinopril Other (See Comments)    cough  . Penicillins Other (See Comments)    Does not remember    Family History  Problem Relation Age of Onset  . Hypertension Mother   . Arthritis Father   . Cancer Sister        Colon  . Colon cancer Sister   . Thyroid disease Neg Hx   . Esophageal cancer Neg Hx   . Pancreatic cancer Neg Hx   . Rectal cancer Neg Hx   . Stomach  cancer Neg Hx     Social History   Socioeconomic History  . Marital status: Single    Spouse name: Not on file  . Number of children: Not on file  . Years of education: Not on file  . Highest education level: Not on file  Occupational History  . Not on file  Social Needs  . Financial resource strain: Not on file  . Food insecurity:    Worry: Not on file    Inability: Not on file  . Transportation needs:    Medical: Not on file    Non-medical: Not on file  Tobacco Use  . Smoking status: Never Smoker  . Smokeless tobacco: Never Used  Substance and Sexual Activity  . Alcohol use: No    Alcohol/week: 0.0 oz  . Drug use: No  . Sexual activity: Not on file  Lifestyle  . Physical activity:    Days per week: Not on file    Minutes per session: Not on file  . Stress: Not on file  Relationships  . Social connections:    Talks on phone: Not on file    Gets together: Not on file    Attends religious service: Not on file  Active member of club or organization: Not on file    Attends meetings of clubs or organizations: Not on file    Relationship status: Not on file  . Intimate partner violence:    Fear of current or ex partner: Not on file    Emotionally abused: Not on file    Physically abused: Not on file    Forced sexual activity: Not on file  Other Topics Concern  . Not on file  Social History Narrative  . Not on file    Hospitiliaztions: None  Health Maintenance:    Flu: 03/2017  Tetanus: 11/2008  Pneumovax: 11/2008  Prevnar: never  Zostavax: 09/2015  PSA: 08/2016  Colon Screening: 2011  Eye Doctor: annually  Dental Exam: annually   Providers:   PCP: Webb Silversmith, NP-C  Endocrinologist: Dr. Raylene Miyamoto   I have personally reviewed and have noted:  1. The patient's medical and social history 2. Their use of alcohol, tobacco or illicit drugs 3. Their current medications and supplements 4. The patient's functional ability including ADL's, fall risks, home  safety risks and hearing or visual impairment. 5. Diet and physical activities 6. Evidence for depression or mood disorder  Subjective:   Review of Systems:   Constitutional: Denies fever, malaise, fatigue, headache or abrupt weight changes.  HEENT: Denies eye pain, eye redness, ear pain, ringing in the ears, wax buildup, runny nose, nasal congestion, bloody nose, or sore throat. Respiratory: Denies difficulty breathing, shortness of breath, cough or sputum production.   Cardiovascular: Denies chest pain, chest tightness, palpitations or swelling in the hands or feet.  Gastrointestinal: Denies abdominal pain, bloating, constipation, diarrhea or blood in the stool.  GU: Denies urgency, frequency, pain with urination, burning sensation, blood in urine, odor or discharge. Musculoskeletal: Denies decrease in range of motion, difficulty with gait, muscle pain or joint pain and swelling.  Skin: Denies redness, rashes, lesions or ulcercations.  Neurological: Denies Dr. dizziness, difficulty with memory, difficulty with speech or problems with balance and coordination.  Psych: Denies anxiety, depression, SI/HI.  No other specific complaints in a complete review of systems (except as listed in HPI above).  Objective:  PE:   BP 124/76   Pulse (!) 59   Temp 97.7 F (36.5 C) (Oral)   Ht 6' 0.5" (1.842 m)   Wt 173 lb (78.5 kg)   SpO2 98%   BMI 23.14 kg/m  Wt Readings from Last 3 Encounters:  09/24/17 173 lb (78.5 kg)  05/20/17 166 lb 12.8 oz (75.7 kg)  04/23/17 165 lb (74.8 kg)    General: Appears his stated age, well developed, well nourished in NAD. Skin: Warm, dry and intact.  HEENT: Head: normal shape and size; Eyes: sclera white, no icterus, conjunctiva pink, PERRLA and EOMs intact; Ears: Tm's gray and intact, normal light reflex; Throat/Mouth: Teeth present, mucosa pink and moist, no exudate, lesions or ulcerations noted.  Neck: Neck supple, trachea midline. No masses, lumps or  thyromegaly present.  Cardiovascular: Normal rate and rhythm. S1,S2 noted.  No murmur, rubs or gallops noted. No JVD or BLE edema. No carotid bruits noted. Pulmonary/Chest: Normal effort and positive vesicular breath sounds. No respiratory distress. No wheezes, rales or ronchi noted.  Abdomen: Soft and nontender. Normal bowel sounds. No distention or masses noted. Liver, spleen and kidneys non palpable. Musculoskeletal:  Strength 5/5 BUE/BLE. No signs of joint swelling.  Neurological: Alert and oriented. Cranial nerves II-XII grossly intact. Coordination normal.  Psychiatric: Mood and affect normal. Behavior is  normal. Judgment and thought content normal.     BMET    Component Value Date/Time   NA 140 09/11/2016 1101   K 4.4 09/11/2016 1101   CL 102 09/11/2016 1101   CO2 31 09/11/2016 1101   GLUCOSE 94 09/11/2016 1101   BUN 14 09/11/2016 1101   CREATININE 1.21 09/11/2016 1101   CREATININE 1.18 02/22/2016 1638   CALCIUM 9.5 09/11/2016 1101    Lipid Panel     Component Value Date/Time   CHOL 193 09/11/2016 1101   TRIG 104.0 09/11/2016 1101   HDL 36.20 (L) 09/11/2016 1101   CHOLHDL 5 09/11/2016 1101   VLDL 20.8 09/11/2016 1101   LDLCALC 136 (H) 09/11/2016 1101    CBC    Component Value Date/Time   WBC 4.4 09/11/2016 1101   RBC 5.39 09/11/2016 1101   HGB 16.2 09/11/2016 1101   HCT 48.1 09/11/2016 1101   PLT 191.0 09/11/2016 1101   MCV 89.3 09/11/2016 1101   MCH 30.1 02/22/2016 1638   MCHC 33.7 09/11/2016 1101   RDW 13.5 09/11/2016 1101    Hgb A1C Lab Results  Component Value Date   HGBA1C 5.3 02/22/2016     Assessment and Plan:   Medicare Annual Wellness Visit:  Diet: He does not eat meat. He consumes fruits and veggies daily, He occassional eats fried foods. He drinks mostly water, juice. Physical activity: Walking Depression/mood screen: Negative Hearing: Intact to whispered voice Visual acuity: Grossly normal, performs annual eye exam  ADLs:  Capable Fall risk: None Home safety: Good Cognitive evaluation: Intact to orientation, naming, recall and repetition EOL planning: Adv directives, full code/ I agree  Preventative Medicine: Encouraged him to get a flu shot in the fall. Tetanus, pneumvoawx and zostovax UTD. Prevnar today. PSA today. Colon screening UTD. Encouraged him to consume a balanced diet and exercise regimen. Advised him to see an eye doctor and dentist annually. Will check CBC, CMET, Lipid, and PSA today.   Next appointment: 1 year Medicare Wellness Exam   Webb Silversmith, NP

## 2017-09-24 NOTE — Patient Instructions (Signed)

## 2017-09-25 LAB — T3: T3 TOTAL: 112 ng/dL (ref 76–181)

## 2017-10-02 ENCOUNTER — Encounter: Payer: Self-pay | Admitting: Internal Medicine

## 2017-10-02 ENCOUNTER — Other Ambulatory Visit: Payer: Self-pay | Admitting: Internal Medicine

## 2017-10-02 DIAGNOSIS — I10 Essential (primary) hypertension: Secondary | ICD-10-CM

## 2017-10-03 NOTE — Assessment & Plan Note (Signed)
Controlled on Amlodipine CBC and CMET today Reinforced DASH diet

## 2017-10-03 NOTE — Assessment & Plan Note (Signed)
Resolved Will check TSH, Free T4 and T3 today

## 2017-10-05 ENCOUNTER — Other Ambulatory Visit: Payer: Self-pay | Admitting: Emergency Medicine

## 2017-10-05 ENCOUNTER — Other Ambulatory Visit: Payer: Self-pay | Admitting: Internal Medicine

## 2017-10-05 DIAGNOSIS — I10 Essential (primary) hypertension: Secondary | ICD-10-CM

## 2017-10-05 MED ORDER — AMLODIPINE BESYLATE 5 MG PO TABS: ORAL_TABLET | ORAL | 3 refills | Status: DC

## 2017-10-05 NOTE — Telephone Encounter (Signed)
Did you phone in the [facility]ipine [facility]; the refill request dated for today has phone in rather than electronic submission.Please advise.

## 2017-10-05 NOTE — Telephone Encounter (Signed)
Pharm states medication has not been called in.  Please call pt with update

## 2017-10-06 NOTE — Telephone Encounter (Signed)
Yes I did

## 2017-10-06 NOTE — Telephone Encounter (Signed)
Patient called in and said the pharmacy does not have the prescription. I told him to hold on and I was going to call [place]. He hung up the phone, please advise

## 2017-10-06 NOTE — Telephone Encounter (Signed)
Patient just called back and said that [facility] just called him and said they do have the medicine ready for him. So disregard

## 2017-12-01 ENCOUNTER — Other Ambulatory Visit: Payer: Self-pay | Admitting: Internal Medicine

## 2017-12-01 DIAGNOSIS — I10 Essential (primary) hypertension: Secondary | ICD-10-CM

## 2017-12-01 MED ORDER — TRIAMTERENE-HCTZ 37.5-25 MG PO TABS: 1.0000 | ORAL_TABLET | Freq: Every day | ORAL | 2 refills | Status: DC

## 2018-03-11 ENCOUNTER — Telehealth: Payer: Self-pay

## 2018-03-11 NOTE — Telephone Encounter (Signed)
-----   Message from Gatha Mayer, MD sent at 03/10/2018  5:14 PM EDT ----- Regarding: needs OV See My Chart but I told him he needs an exam by me, MD/DO colleague or an APP  Please contact him and schedule

## 2018-03-11 NOTE — Telephone Encounter (Signed)
Please contact patient to get him scheduled with first available appointment, for changes in bowel habits. Thank you.

## 2018-03-12 NOTE — Telephone Encounter (Signed)
Left message to call back  

## 2018-03-24 ENCOUNTER — Encounter: Payer: Self-pay | Admitting: Internal Medicine

## 2018-03-24 DIAGNOSIS — Z136 Encounter for screening for cardiovascular disorders: Secondary | ICD-10-CM

## 2018-03-25 ENCOUNTER — Other Ambulatory Visit: Payer: Self-pay | Admitting: Internal Medicine

## 2018-03-25 ENCOUNTER — Ambulatory Visit (INDEPENDENT_AMBULATORY_CARE_PROVIDER_SITE_OTHER): Payer: Medicare Other | Admitting: Nurse Practitioner

## 2018-03-25 ENCOUNTER — Encounter: Payer: Self-pay | Admitting: Nurse Practitioner

## 2018-03-25 VITALS — BP 122/74 | HR 80 | Ht 72.0 in | Wt 165.0 lb

## 2018-03-25 DIAGNOSIS — K6289 Other specified diseases of anus and rectum: Secondary | ICD-10-CM

## 2018-03-25 DIAGNOSIS — E785 Hyperlipidemia, unspecified: Secondary | ICD-10-CM

## 2018-03-25 NOTE — Patient Instructions (Signed)
If you are age 66 or older, your body mass index should be between 23-30. Your Body mass index is 22.38 kg/m. If this is out of the aforementioned range listed, please consider follow up with your Primary Care Provider.  If you are age 42 or younger, your body mass index should be between 19-25. Your Body mass index is 22.38 kg/m. If this is out of the aformentioned range listed, please consider follow up with your Primary Care Provider.   Call back if recurrent anal pain.  Thank you for choosing me and [facility].   Tye Savoy, NP

## 2018-03-25 NOTE — Progress Notes (Signed)
IMPRESSION and PLAN:    67 year old male with perianal burning/discomfort.  This started after patient began having bulky stools on high-fiber diet and fiber supplements.  After reduction of fiber intake stool size has gotten back to normal and perianal discomfort has significantly improved.  No lesions/fissures/hemorrhoids on exam.  Patient is nontender on DRE speaking against fissure -No intervention needed at this point.  Patient has done well titrating fiber to manage constipation.  He will call back if pain does not totally resolve or certainly if it gets worse.  HPI:     Patient is a 67 year old male known to Dr. Carlean Purl.  He has a family history of colon cancer in sister and personal hx of small colon adenoma in Nov 2018, on for 5 year surveillance colonoscopy.   Chief complaint: anal discomfort  Last year patient began having problems with constipation defined as hard stool.  He has always consumed a lot of water and high-fiber diet but decided to increase dietary fiber and add in supplemental fiber as well.  This worked well until just recently when stools became very bulky /difficult to pass.  He began noticing some perianal rectal burning/discomfort.  Patient felt like the irritation was from trying to pass excessively large stools.  He reduce the amount of fiber intake/supplements which did decrease the size of his stools and perianal pain has subsequently gotten better.  Patient decided to keep his appointment today despite resolving discomfort.  He has not had any rectal bleeding.  No abdominal pain.  No other GI nor any general medical complaints  Review of systems:     No chest pain, no SOB, no fevers, no urinary sx   Past Medical History:  Diagnosis Date  . Allergy   . Childhood asthma   . History of blood transfusion   . Hx of adenomatous polyp of colon 04/29/2017  . Hypertension   . Hyperthyroidism   . Positive TB test     Patient's surgical history, family  medical history, social history, medications and allergies were all reviewed in Epic   Creatinine clearance cannot be calculated (Patient's most recent lab result is older than the maximum 21 days allowed.)  Current Outpatient Medications  Medication Sig Dispense Refill  . amLODipine (NORVASC) 5 MG tablet TAKE 1 TABLET(5 MG) BY MOUTH DAILY 90 tablet 3  . Cholecalciferol (VITAMIN D-3 PO) Take 1 capsule by mouth 2 (two) times a week.     . Cyanocobalamin (VITAMIN B-12 PO) Take 1 tablet by mouth 2 (two) times a week.     . Misc Natural Products (GLUCOS-CHONDROIT-MSM COMPLEX) TABS Take 3 tablets by mouth daily.    . Multiple Vitamin (MULTIVITAMIN) tablet Take 1 tablet by mouth daily.    . Omega-3 Fatty Acids (FISH OIL) 1200 MG CAPS Take 1 capsule by mouth 3 (three) times daily.     Marland Kitchen OVER THE COUNTER MEDICATION Prostate Blend 1000 mg 3  tablets  daily.    . Saw Palmetto 450 MG CAPS Take 6 capsules by mouth daily.     Marland Kitchen triamterene-hydrochlorothiazide (MAXZIDE-25) 37.5-25 MG tablet Take 1 tablet by mouth daily. 90 tablet 2   No current facility-administered medications for this visit.     Physical Exam:     BP 122/74   Pulse 80   Ht 6' (1.829 m)   Wt 165 lb (74.8 kg)   BMI 22.38 kg/m   GENERAL:  Pleasant male in NAD PSYCH: : Cooperative, normal  affect ABDOMEN:  Nondistended, soft, nontender. No obvious masses, no hepatomegaly,  normal bowel sounds Rectal: No external lesions noted.  I could not appreciate any fissures.  He had no pain with DRE.  Musculoskeletal:  Normal muscle tone, normal strength NEURO: Alert and oriented x 3, no focal neurologic deficits   Tye Savoy , NP 03/25/2018, 11:08 AM

## 2018-04-01 ENCOUNTER — Ambulatory Visit (INDEPENDENT_AMBULATORY_CARE_PROVIDER_SITE_OTHER): Payer: Medicare Other

## 2018-04-01 DIAGNOSIS — Z23 Encounter for immunization: Secondary | ICD-10-CM

## 2018-08-27 ENCOUNTER — Encounter: Payer: Self-pay | Admitting: Internal Medicine

## 2018-08-27 ENCOUNTER — Other Ambulatory Visit: Payer: Self-pay | Admitting: Internal Medicine

## 2018-08-27 DIAGNOSIS — I10 Essential (primary) hypertension: Secondary | ICD-10-CM

## 2018-08-27 MED ORDER — TRIAMTERENE-HCTZ 37.5-25 MG PO TABS: 1.0000 | ORAL_TABLET | Freq: Every day | ORAL | 0 refills | Status: DC

## 2018-09-06 ENCOUNTER — Encounter: Payer: Self-pay | Admitting: Internal Medicine

## 2018-09-25 ENCOUNTER — Other Ambulatory Visit: Payer: Self-pay | Admitting: Internal Medicine

## 2018-09-25 DIAGNOSIS — I10 Essential (primary) hypertension: Secondary | ICD-10-CM

## 2018-09-30 ENCOUNTER — Encounter: Payer: Self-pay | Admitting: Internal Medicine

## 2018-09-30 ENCOUNTER — Ambulatory Visit (INDEPENDENT_AMBULATORY_CARE_PROVIDER_SITE_OTHER)
Admission: RE | Admit: 2018-09-30 | Discharge: 2018-09-30 | Disposition: A | Discharge status: Home / Self Care | Payer: Medicare Other | Source: Ambulatory Visit | Attending: Internal Medicine | Admitting: Internal Medicine

## 2018-09-30 ENCOUNTER — Other Ambulatory Visit: Payer: Self-pay

## 2018-09-30 ENCOUNTER — Ambulatory Visit (INDEPENDENT_AMBULATORY_CARE_PROVIDER_SITE_OTHER): Payer: Medicare Other | Admitting: Internal Medicine

## 2018-09-30 VITALS — BP 124/76 | HR 69 | Temp 97.8°F | Wt 169.0 lb

## 2018-09-30 DIAGNOSIS — M26621 Arthralgia of right temporomandibular joint: Secondary | ICD-10-CM | POA: Diagnosis not present

## 2018-09-30 DIAGNOSIS — M79621 Pain in right upper arm: Secondary | ICD-10-CM

## 2018-09-30 MED ORDER — PREDNISONE 10 MG PO TABS: ORAL_TABLET | ORAL | 0 refills | Status: DC

## 2018-09-30 NOTE — Patient Instructions (Signed)
Biceps Tendon Tendinitis (Distal)    Distal biceps tendon tendinitis is inflammation of the distal biceps tendon. The distal biceps tendon is a strong cord of tissue that connects the biceps muscle, on the front of the upper arm, to a bone (radius) in the elbow. Distal biceps tendon tendinitis can interfere with the ability to bend the elbow and turn the hand palm-up (supination). This condition is usually caused by overusing the elbow joint and the biceps muscle, and it usually heals within 6 weeks.  Distal biceps tendon tendinitis may include a grade 1 or grade 2 strain of the tendon. A grade 1 strain is mild, and it involves a slight pull of the tendon without any stretching or noticeable tearing of the tendon. There is usually no loss of biceps muscle strength. A grade 2 strain is moderate, and it involves a small tear in the tendon. The tendon is stretched, and biceps muscle strength is usually decreased.  What are the causes?  This condition may be caused by:   A sudden increase in the frequency or intensity of activity that involves the elbow and the biceps muscle.   Overuse of the biceps muscle. This can happen when you do the same movements over and over, such as:  ? Supination.  ? Forceful straightening (hyperextension) of the elbow.  ? Bending of the elbow.   A direct, forceful hit or injury (trauma) to the elbow. This is rare.  What increases the risk?  The following factors may make you more likely to develop this condition:   Playing contact sports.   Playing sports that involve throwing and overhead movements, including racket sports, gymnastics, weight lifting, or bodybuilding.   Doing physical labor.   Having poor strength and flexibility of the arm and shoulder.   Having injured other parts of the elbow.  What are the signs or symptoms?  Symptoms of this condition may include:   Pain and inflammation in the front of the elbow. Pain may get worse during certain movements, such  as:  ? Supination.  ? Bending the elbow.  ? Lifting or carrying objects.  ? Throwing.   A feeling of warmth in the front of the elbow.   A crackling sound (crepitation) when you move or touch the elbow or the upper arm.  In some cases, symptoms may return (recur) after treatment, and they may be long-lasting (chronic).  How is this diagnosed?  This condition is diagnosed based on your symptoms, your medical history, and a physical exam. You may have tests, including X-rays or MRIs. Your health care provider may test your range of motion by having you do arm movements.  How is this treated?  This condition is treated by resting and icing the injured area, and by doing physical therapy exercises. Depending on the severity of your condition, treatment may also include:   Medicines to help relieve pain and inflammation.   Ultrasound therapy. This is the application of sound waves to the injured area.  Follow these instructions at home:  Managing pain, stiffness, and swelling     If directed, put ice on the injured area:  ? Put ice in a plastic bag.  ? Place a towel between your skin and the bag.  ? Leave the ice on for 20 minutes, 2-3 times a day.   Move your fingers often to avoid stiffness and to lessen swelling.   Raise (elevate) the injured area above the level of your heart while you are   sitting or lying down.   If directed, apply heat to the affected area before you exercise. Use the heat source that your health care provider recommends, such as a moist heat pack or a heating pad.  ? Place a towel between your skin and the heat source.  ? Leave the heat on for 20-30 minutes.  ? Remove the heat if your skin turns bright red. This is especially important if you are unable to feel pain, heat, or cold. You may have a greater risk of getting burned.  Activity   Return to your normal activities as told by your health care provider. Ask your health care provider what activities are safe for you.   Do not lift  anything that is heavier than 10 lb (4.5 kg) until your health care provider tells you that it is safe.   Avoid activities that cause pain or make your condition worse.   Do exercises as told by your health care provider.  General instructions   Take over-the-counter and prescription medicines only as told by your health care provider.   Do not drive or operate heavy machinery while taking prescription pain medicines.   Keep all follow-up visits as told by your health care provider. This is important.  How is this prevented?   Warm up and stretch before being active.   Cool down and stretch after being active.   Give your body time to rest between periods of activity.   Make sure to use equipment that fits you.   Be safe and responsible while being active to avoid falls.   Do at least 150 minutes of moderate-intensity exercise each week, such as brisk walking or water aerobics.   Maintain physical fitness, including:  ? Strength.  ? Flexibility.  ? Cardiovascular fitness.  ? Endurance.  Contact a health care provider if:   You have symptoms that get worse or do not get better after 2 weeks of treatment.   You develop new symptoms.  Get help right away if:   You develop severe pain.  This information is not intended to replace advice given to you by your health care provider. Make sure you discuss any questions you have with your health care provider.  Document Released: 06/02/2005 Document Revised: 02/07/2016 Document Reviewed: 05/11/2015  [employer] Interactive Patient Education  2019 [employer]

## 2018-09-30 NOTE — Progress Notes (Signed)
Subjective:    Patient ID: Brian Matthews, male    DOB: 06/19/1950, 68 y.o.   MRN: [id number]  HPI  Pt presents to the clinic today with c/o right upper arm pain. He reports this started 8 days ago. The pain comes and goes. He describes the pain as sore and achy. The pain does not radiate. He denies numbness, tingling or weakness. He denies any injury to the area. He has tried Aleve, Ibuprofen and ASA with some relief.  He also c/o right side jaw pain. This started 2 days ago. He noticed the pain when he was chewing. He denies any pain in his teeth. He denies sinus pain or pressure, nasal congestion, runny nose, ear pain or sore throat. He reports he does grind his teeth at night. He has taken Aleve, Ibuprofen and ASA with some relief.  He is very concerned that he has bone cancer. He has no family history of bone cancer. Review of Systems      Past Medical History:  Diagnosis Date  . Allergy   . Childhood asthma   . History of blood transfusion   . Hx of adenomatous polyp of colon 04/29/2017  . Hypertension   . Hyperthyroidism   . Positive TB test     Current Outpatient Medications  Medication Sig Dispense Refill  . amLODipine (NORVASC) 5 MG tablet TAKE 1 TABLET BY MOUTH EVERY DAY 90 tablet 0  . Cholecalciferol (VITAMIN D-3 PO) Take 1 capsule by mouth 2 (two) times a week.     . Cyanocobalamin (VITAMIN B-12 PO) Take 1 tablet by mouth 2 (two) times a week.     . Misc Natural Products (GLUCOS-CHONDROIT-MSM COMPLEX) TABS Take 3 tablets by mouth daily.    . Multiple Vitamin (MULTIVITAMIN) tablet Take 1 tablet by mouth daily.    . Omega-3 Fatty Acids (FISH OIL) 1200 MG CAPS Take 1 capsule by mouth 3 (three) times daily.     Marland Kitchen OVER THE COUNTER MEDICATION Prostate Blend 1000 mg 3  tablets  daily.    . Saw Palmetto 450 MG CAPS Take 6 capsules by mouth daily.     Marland Kitchen triamterene-hydrochlorothiazide (MAXZIDE-25) 37.5-25 MG tablet TAKE 1 TABLET BY MOUTH DAILY 90 tablet 0   No current  facility-administered medications for this visit.     Allergies  Allergen Reactions  . Lisinopril Other (See Comments)    cough  . Penicillins Other (See Comments)    Does not remember    Family History  Problem Relation Age of Onset  . Hypertension Mother   . Arthritis Father   . Colon cancer Sister   . Thyroid disease Neg Hx   . Esophageal cancer Neg Hx   . Pancreatic cancer Neg Hx   . Rectal cancer Neg Hx   . Stomach cancer Neg Hx     Social History   Socioeconomic History  . Marital status: Divorced    Spouse name: Not on file  . Number of children: Not on file  . Years of education: Not on file  . Highest education level: Not on file  Occupational History  . Not on file  Social Needs  . Financial resource strain: Not on file  . Food insecurity:    Worry: Not on file    Inability: Not on file  . Transportation needs:    Medical: Not on file    Non-medical: Not on file  Tobacco Use  . Smoking status: Never Smoker  . Smokeless tobacco:  Never Used  Substance and Sexual Activity  . Alcohol use: No    Alcohol/week: 0.0 standard drinks  . Drug use: No  . Sexual activity: Not on file  Lifestyle  . Physical activity:    Days per week: Not on file    Minutes per session: Not on file  . Stress: Not on file  Relationships  . Social connections:    Talks on phone: Not on file    Gets together: Not on file    Attends religious service: Not on file    Active member of club or organization: Not on file    Attends meetings of clubs or organizations: Not on file    Relationship status: Not on file  . Intimate partner violence:    Fear of current or ex partner: Not on file    Emotionally abused: Not on file    Physically abused: Not on file    Forced sexual activity: Not on file  Other Topics Concern  . Not on file  Social History Narrative  . Not on file     Constitutional: Denies fever, malaise, fatigue, headache or abrupt weight changes.  HEENT: Denies  eye pain, eye redness, ear pain, ringing in the ears, wax buildup, runny nose, nasal congestion, bloody nose, or sore throat. Respiratory: Denies difficulty breathing, shortness of breath, cough or sputum production.   Cardiovascular: Denies chest pain, chest tightness, palpitations or swelling in the hands or feet.  Musculoskeletal: Pt reports right upper arm pain, right side jaw pain. Denies decrease in range of motion, difficulty with gait, or joint swelling.  Skin: Denies redness, rashes, lesions or ulcercations.  Neurological: Denies dizziness, difficulty with memory, difficulty with speech or problems with balance and coordination.    No other specific complaints in a complete review of systems (except as listed in HPI above).  Objective:   Physical Exam   BP 124/76   Pulse 69   Temp 97.8 F (36.6 C) (Oral)   Wt 169 lb (76.7 kg)   SpO2 98%   BMI 22.92 kg/m  Wt Readings from Last 3 Encounters:  09/30/18 169 lb (76.7 kg)  03/25/18 165 lb (74.8 kg)  09/24/17 173 lb (78.5 kg)    General: Appears his stated age, well developed, well nourished in NAD. Skin: Warm, dry and intact. No rashes, lesions or ulcerations noted. HEENT: Head: normal shape and size, no sinus tenderness noted; Ears: Tm's gray and intact, normal light reflex; Nose: mucosa pink and moist, septum midline; Throat/Mouth: Teeth present, mucosa pink and moist, no exudate, lesions or ulcerations noted.  Neck:  No adenopathy noted. Cardiovascular: Normal rate and rhythm. S Pulmonary/Chest: Normal effort and positive vesicular breath sounds. No respiratory distress. No wheezes, rales or ronchi noted.  Musculoskeletal: Normal internal and external rotation of the right shoulder. No pain with palpation of the right shoulder. Pain with palpation of the right bicep. Negative drop can test. Strength 5/5 BUE. Hand grips equal. Pain with palpation over the right TMJ but no clicking or popping noted with extension. Neurological:  Alert and oriented. Sensation intact to BUE. Psychiatric: Mildly anxious appearing.  BMET    Component Value Date/Time   NA 138 09/24/2017 1307   K 4.2 09/24/2017 1307   CL 102 09/24/2017 1307   CO2 31 09/24/2017 1307   GLUCOSE 85 09/24/2017 1307   BUN 8 09/24/2017 1307   CREATININE 1.06 09/24/2017 1307   CREATININE 1.18 02/22/2016 1638   CALCIUM 9.0 09/24/2017 1307  Lipid Panel     Component Value Date/Time   CHOL 168 09/24/2017 1307   TRIG 102.0 09/24/2017 1307   HDL 34.00 (L) 09/24/2017 1307   CHOLHDL 5 09/24/2017 1307   VLDL 20.4 09/24/2017 1307   LDLCALC 113 (H) 09/24/2017 1307    CBC    Component Value Date/Time   WBC 3.7 (L) 09/24/2017 1307   RBC 5.10 09/24/2017 1307   HGB 15.7 09/24/2017 1307   HCT 45.7 09/24/2017 1307   PLT 160.0 09/24/2017 1307   MCV 89.6 09/24/2017 1307   MCH 30.1 02/22/2016 1638   MCHC 34.4 09/24/2017 1307   RDW 13.7 09/24/2017 1307    Hgb A1C Lab Results  Component Value Date   HGBA1C 5.3 02/22/2016           Assessment & Plan:   Right Upper Arm Pain:  Will obtain xray of right humerus Likely biceps tendonitis Avoid overuse RX for Pred Taper x 9 days- avoid OTC NSAID's  TMJ Pain:  Discussed getting a bite guard so that he doesn't grind his teeth at night Prednisone I am giving for tendonitis will help  Return precautions discussed Webb Silversmith, NP

## 2018-10-01 ENCOUNTER — Encounter: Payer: Self-pay | Admitting: Internal Medicine

## 2018-11-04 ENCOUNTER — Encounter: Payer: Self-pay | Admitting: Internal Medicine

## 2018-11-05 ENCOUNTER — Encounter: Payer: Self-pay | Admitting: Internal Medicine

## 2018-11-08 ENCOUNTER — Encounter: Payer: Self-pay | Admitting: Internal Medicine

## 2018-11-10 ENCOUNTER — Ambulatory Visit (INDEPENDENT_AMBULATORY_CARE_PROVIDER_SITE_OTHER): Payer: Medicare Other | Admitting: Internal Medicine

## 2018-11-10 ENCOUNTER — Ambulatory Visit (INDEPENDENT_AMBULATORY_CARE_PROVIDER_SITE_OTHER)
Admission: RE | Admit: 2018-11-10 | Discharge: 2018-11-10 | Disposition: A | Discharge status: Home / Self Care | Payer: Medicare Other | Source: Ambulatory Visit | Attending: Internal Medicine | Admitting: Internal Medicine

## 2018-11-10 ENCOUNTER — Other Ambulatory Visit: Payer: Self-pay

## 2018-11-10 ENCOUNTER — Encounter: Payer: Self-pay | Admitting: Internal Medicine

## 2018-11-10 VITALS — BP 120/70 | HR 81 | Temp 98.0°F | Ht 72.25 in | Wt 166.0 lb

## 2018-11-10 DIAGNOSIS — E061 Subacute thyroiditis: Secondary | ICD-10-CM

## 2018-11-10 DIAGNOSIS — R0602 Shortness of breath: Secondary | ICD-10-CM

## 2018-11-10 DIAGNOSIS — Z125 Encounter for screening for malignant neoplasm of prostate: Secondary | ICD-10-CM

## 2018-11-10 DIAGNOSIS — Z23 Encounter for immunization: Secondary | ICD-10-CM | POA: Diagnosis not present

## 2018-11-10 DIAGNOSIS — Z0000 Encounter for general adult medical examination without abnormal findings: Secondary | ICD-10-CM | POA: Diagnosis not present

## 2018-11-10 DIAGNOSIS — I10 Essential (primary) hypertension: Secondary | ICD-10-CM | POA: Diagnosis not present

## 2018-11-10 NOTE — Progress Notes (Signed)
HPI:  Pt due for his subsequent Medicare Wellness Exam. He is also due to follow up chronic conditions.  HTN: His BP today is 120/70. He is taking Amlodipine and Triamterene HCT as prescribed. ECG from 09/2017 reviewed.  History of Subacute Thyroiditis: Resolved. He is not taking any medication for this. He is no longer seeing endocrinology.  GERD: Intermittent. He takes Famotidine as needed with good.  Past Medical History:  Diagnosis Date  . Allergy   . Childhood asthma   . History of blood transfusion   . Hx of adenomatous polyp of colon 04/29/2017  . Hypertension   . Hyperthyroidism   . Positive TB test     Current Outpatient Medications  Medication Sig Dispense Refill  . amLODipine (NORVASC) 5 MG tablet TAKE 1 TABLET BY MOUTH EVERY DAY 90 tablet 0  . Cholecalciferol (VITAMIN D-3 PO) Take 1 capsule by mouth 2 (two) times a week.     . Cyanocobalamin (VITAMIN B-12 PO) Take 1 tablet by mouth 2 (two) times a week.     . Misc Natural Products (GLUCOS-CHONDROIT-MSM COMPLEX) TABS Take 3 tablets by mouth daily.    . Multiple Vitamin (MULTIVITAMIN) tablet Take 1 tablet by mouth daily.    . Omega-3 Fatty Acids (FISH OIL) 1200 MG CAPS Take 1 capsule by mouth 3 (three) times daily.     Marland Kitchen OVER THE COUNTER MEDICATION Prostate Blend 1000 mg 3  tablets  daily.    . predniSONE (DELTASONE) 10 MG tablet Take 3 tabs on days 1-3, take 2 tabs on days 4-6, take 1 tab on days 7-9 18 tablet 0  . Saw Palmetto 450 MG CAPS Take 6 capsules by mouth daily.     Marland Kitchen triamterene-hydrochlorothiazide (MAXZIDE-25) 37.5-25 MG tablet TAKE 1 TABLET BY MOUTH DAILY 90 tablet 0   No current facility-administered medications for this visit.     Allergies  Allergen Reactions  . Lisinopril Other (See Comments)    cough  . Penicillins Other (See Comments)    Does not remember    Family History  Problem Relation Age of Onset  . Hypertension Mother   . Arthritis Father   . Colon cancer Sister   . Thyroid  disease Neg Hx   . Esophageal cancer Neg Hx   . Pancreatic cancer Neg Hx   . Rectal cancer Neg Hx   . Stomach cancer Neg Hx     Social History   Socioeconomic History  . Marital status: Divorced    Spouse name: Not on file  . Number of children: Not on file  . Years of education: Not on file  . Highest education level: Not on file  Occupational History  . Not on file  Social Needs  . Financial resource strain: Not on file  . Food insecurity:    Worry: Not on file    Inability: Not on file  . Transportation needs:    Medical: Not on file    Non-medical: Not on file  Tobacco Use  . Smoking status: Never Smoker  . Smokeless tobacco: Never Used  Substance and Sexual Activity  . Alcohol use: No    Alcohol/week: 0.0 standard drinks  . Drug use: No  . Sexual activity: Not on file  Lifestyle  . Physical activity:    Days per week: Not on file    Minutes per session: Not on file  . Stress: Not on file  Relationships  . Social connections:    Talks on phone: Not  on file    Gets together: Not on file    Attends religious service: Not on file    Active member of club or organization: Not on file    Attends meetings of clubs or organizations: Not on file    Relationship status: Not on file  . Intimate partner violence:    Fear of current or ex partner: Not on file    Emotionally abused: Not on file    Physically abused: Not on file    Forced sexual activity: Not on file  Other Topics Concern  . Not on file  Social History Narrative  . Not on file   PHQ 9 score of 0  Hospitiliaztions: None  Health Maintenance:    Flu: 03/2018  Tetanus: 11/2008  Pneumovax: 07/2008  Prevnar: 09/2017  Zostavax: 09/2015  Shingrix: never  PSA: 09/2017  Colon Screening: 04/2017  Eye Doctor: annually  Dental Exam: biannually   Providers:   PCP: Webb Silversmith, NP-C    I have personally reviewed and have noted:  1. The patient's medical and social history 2. Their use of alcohol,  tobacco or illicit drugs 3. Their current medications and supplements 4. The patient's functional ability including ADL's, fall risks, home safety risks and hearing or visual impairment. 5. Diet and physical activities 6. Evidence for depression or mood disorder  Subjective:   Review of Systems:   Constitutional: Denies fever, malaise, fatigue, headache or abrupt weight changes.  HEENT: Denies eye pain, eye redness, ear pain, ringing in the ears, wax buildup, runny nose, nasal congestion, bloody nose, or sore throat. Respiratory: Pt reports intermittent shortness of breath. Denies difficulty breathing, cough or sputum production.   Cardiovascular: Denies chest pain, chest tightness, palpitations or swelling in the hands or feet.  Gastrointestinal: Pt reports intermittent reflux. Denies abdominal pain, bloating, constipation, diarrhea or blood in the stool.  GU: Denies urgency, frequency, pain with urination, burning sensation, blood in urine, odor or discharge. Musculoskeletal: Denies decrease in range of motion, difficulty with gait, muscle pain or joint pain and swelling.  Skin: Denies redness, rashes, lesions or ulcercations.  Neurological: Denies dizziness, difficulty with memory, difficulty with speech or problems with balance and coordination.  Psych: Denies anxiety, depression, SI/HI.  No other specific complaints in a complete review of systems (except as listed in HPI above).  Objective:  PE:   BP 120/70   Pulse 81   Temp 98 F (36.7 C) (Oral)   Ht 6' 0.25" (1.835 m)   Wt 166 lb (75.3 kg)   SpO2 99%   BMI 22.36 kg/m   Wt Readings from Last 3 Encounters:  09/30/18 169 lb (76.7 kg)  03/25/18 165 lb (74.8 kg)  09/24/17 173 lb (78.5 kg)    General: Appears his stated age, well developed, well nourished in NAD. Skin: Warm, dry and intact.  HEENT: Head: normal shape and size; Eyes: sclera white, no icterus, conjunctiva pink, PERRLA and EOMs intact; Ears: Tm's gray and  intact, normal light reflex; Throat/Mouth: Teeth present, mucosa pink and moist, no exudate, lesions or ulcerations noted.  Neck: Neck supple, trachea midline. No masses, lumps or thyromegaly present.  Cardiovascular: Normal rate and rhythm. S1,S2 noted.  No murmur, rubs or gallops noted. No JVD or BLE edema. No carotid bruits noted. Pulmonary/Chest: Normal effort and positive vesicular breath sounds. No respiratory distress. No wheezes, rales or ronchi noted.  Abdomen: Soft and nontender. Normal bowel sounds. No distention or masses noted. Liver, spleen and kidneys non  palpable. Musculoskeletal:  Strength 5/5 BUE/BLE. No difficulty with gait. Neurological: Alert and oriented. Cranial nerves II-XII grossly intact. Coordination normal.  Psychiatric: Mood and affect normal. Behavior is normal. Judgment and thought content normal.     BMET    Component Value Date/Time   NA 138 09/24/2017 1307   K 4.2 09/24/2017 1307   CL 102 09/24/2017 1307   CO2 31 09/24/2017 1307   GLUCOSE 85 09/24/2017 1307   BUN 8 09/24/2017 1307   CREATININE 1.06 09/24/2017 1307   CREATININE 1.18 02/22/2016 1638   CALCIUM 9.0 09/24/2017 1307    Lipid Panel     Component Value Date/Time   CHOL 168 09/24/2017 1307   TRIG 102.0 09/24/2017 1307   HDL 34.00 (L) 09/24/2017 1307   CHOLHDL 5 09/24/2017 1307   VLDL 20.4 09/24/2017 1307   LDLCALC 113 (H) 09/24/2017 1307    CBC    Component Value Date/Time   WBC 3.7 (L) 09/24/2017 1307   RBC 5.10 09/24/2017 1307   HGB 15.7 09/24/2017 1307   HCT 45.7 09/24/2017 1307   PLT 160.0 09/24/2017 1307   MCV 89.6 09/24/2017 1307   MCH 30.1 02/22/2016 1638   MCHC 34.4 09/24/2017 1307   RDW 13.7 09/24/2017 1307    Hgb A1C Lab Results  Component Value Date   HGBA1C 5.3 02/22/2016      Assessment and Plan:   Medicare Annual Wellness Visit:  Diet: He does not eat meat. He consumes fruits and veggies daily. He occasionally eats fried foods. He drinks mostly  water, unsweet tea, Armour Light Physical activity: 35 minutes on Elliptical, light weights most days of the weeks. Depression/mood screen: Negative Hearing: Intact to whispered voice Visual acuity: Grossly normal, performs annual eye exam  ADLs: Capable Fall risk: None Home safety: Good Cognitive evaluation: Intact to orientation, naming, recall and repetition EOL planning: Adv directives, full code/ I agree  Preventative Medicine: Flu, tetanus, prevnar and zostavax UTD. Pneumovax today. He declines shingrix. Colon screening UTD. Encouraged him to consume a balanced diet and exercise regimen. Advised him to see an eye doctor and dentist annually. Will check CBC, CMET, Lipid, TSH, Free T4 and PSA.  Shortness of Breath:   ? R/t GERD Chest xray today for further evaluation  Next appointment: 1 year, Medicare Wellness Exam   Webb Silversmith, NP

## 2018-11-10 NOTE — Assessment & Plan Note (Signed)
Controlled on Amlodipine and Triamterene HCT Will check BMET today Reinforced DASH diet and exercise for weight loss

## 2018-11-10 NOTE — Patient Instructions (Signed)
Health Maintenance After Age 67 After age 67, you are at a higher risk for certain long-term diseases and infections as well as injuries from falls. Falls are a major cause of broken bones and head injuries in people who are older than age 67. Getting regular preventive care can help to keep you healthy and well. Preventive care includes getting regular testing and making lifestyle changes as recommended by your health care provider. Talk with your health care provider about:  Which screenings and tests you should have. A screening is a test that checks for a disease when you have no symptoms.  A diet and exercise plan that is right for you. What should I know about screenings and tests to prevent falls? Screening and testing are the best ways to find a health problem early. Early diagnosis and treatment give you the best chance of managing medical conditions that are common after age 67. Certain conditions and lifestyle choices may make you more likely to have a fall. Your health care provider may recommend:  Regular vision checks. Poor vision and conditions such as cataracts can make you more likely to have a fall. If you wear glasses, make sure to get your prescription updated if your vision changes.  Medicine review. Work with your health care provider to regularly review all of the medicines you are taking, including over-the-counter medicines. Ask your health care provider about any side effects that may make you more likely to have a fall. Tell your health care provider if any medicines that you take make you feel dizzy or sleepy.  Osteoporosis screening. Osteoporosis is a condition that causes the bones to get weaker. This can make the bones weak and cause them to break more easily.  Blood pressure screening. Blood pressure changes and medicines to control blood pressure can make you feel dizzy.  Strength and balance checks. Your health care provider may recommend certain tests to check your  strength and balance while standing, walking, or changing positions.  Foot health exam. Foot pain and numbness, as well as not wearing proper footwear, can make you more likely to have a fall.  Depression screening. You may be more likely to have a fall if you have a fear of falling, feel emotionally low, or feel unable to do activities that you used to do.  Alcohol use screening. Using too much alcohol can affect your balance and may make you more likely to have a fall. What actions can I take to lower my risk of falls? General instructions  Talk with your health care provider about your risks for falling. Tell your health care provider if: ? You fall. Be sure to tell your health care provider about all falls, even ones that seem minor. ? You feel dizzy, sleepy, or off-balance.  Take over-the-counter and prescription medicines only as told by your health care provider. These include any supplements.  Eat a healthy diet and maintain a healthy weight. A healthy diet includes low-fat dairy products, low-fat (lean) meats, and fiber from whole grains, beans, and lots of fruits and vegetables. Home safety  Remove any tripping hazards, such as rugs, cords, and clutter.  Install safety equipment such as grab bars in bathrooms and safety rails on stairs.  Keep rooms and walkways well-lit. Activity   Follow a regular exercise program to stay fit. This will help you maintain your balance. Ask your health care provider what types of exercise are appropriate for you.  If you need a cane or   walker, use it as recommended by your health care provider.  Wear supportive shoes that have nonskid soles. Lifestyle  Do not drink alcohol if your health care provider tells you not to drink.  If you drink alcohol, limit how much you have: ? 0-1 drink a day for women. ? 0-2 drinks a day for men.  Be aware of how much alcohol is in your drink. In the [place] one drink equals one typical bottle of beer (12  oz), one-half glass of wine (5 oz), or one shot of hard liquor (1 oz).  Do not use any products that contain nicotine or tobacco, such as cigarettes and e-cigarettes. If you need help quitting, ask your health care provider. Summary  Having a healthy lifestyle and getting preventive care can help to protect your health and wellness after age 67.  Screening and testing are the best way to find a health problem early and help you avoid having a fall. Early diagnosis and treatment give you the best chance for managing medical conditions that are more common for people who are older than age 67.  Falls are a major cause of broken bones and head injuries in people who are older than age 67. Take precautions to prevent a fall at home.  Work with your health care provider to learn what changes you can make to improve your health and wellness and to prevent falls. This information is not intended to replace advice given to you by your health care provider. Make sure you discuss any questions you have with your health care provider. Document Released: 04/15/2017 Document Revised: 04/15/2017 Document Reviewed: 04/15/2017 Elsevier Interactive Patient Education  2019 [employer]  

## 2018-11-10 NOTE — Assessment & Plan Note (Signed)
Will check TSH and Free T4 today.

## 2018-11-11 LAB — LIPID PANEL
Cholesterol: 175 mg/dL (ref 0–200)
HDL: 38.5 mg/dL — ABNORMAL LOW (ref >39.00)
LDL Cholesterol: 122 mg/dL — ABNORMAL HIGH (ref 0–99)
NonHDL: 136.23
Total CHOL/HDL Ratio: 5
Triglycerides: 69 mg/dL (ref 0.0–149.0)
VLDL: 13.8 mg/dL (ref 0.0–40.0)

## 2018-11-11 LAB — CBC
HCT: 47.3 % (ref 39.0–52.0)
Hemoglobin: 16.6 g/dL (ref 13.0–17.0)
MCHC: 35.1 g/dL (ref 30.0–36.0)
MCV: 89.6 fl (ref 78.0–100.0)
Platelets: 255 10*3/uL (ref 150.0–400.0)
RBC: 5.28 Mil/uL (ref 4.22–5.81)
RDW: 13.4 % (ref 11.5–15.5)
WBC: 5.3 10*3/uL (ref 4.0–10.5)

## 2018-11-11 LAB — COMPREHENSIVE METABOLIC PANEL
ALT: 12 U/L (ref 0–53)
AST: 17 U/L (ref 0–37)
Albumin: 4.4 g/dL (ref 3.5–5.2)
Alkaline Phosphatase: 55 U/L (ref 39–117)
BUN: 10 mg/dL (ref 6–23)
CO2: 29 mEq/L (ref 19–32)
Calcium: 9.5 mg/dL (ref 8.4–10.5)
Chloride: 98 mEq/L (ref 96–112)
Creatinine, Ser: 1.07 mg/dL (ref 0.40–1.50)
GFR: 83.42 mL/min (ref >60.00)
Glucose, Bld: 83 mg/dL (ref 70–99)
Potassium: 4.7 mEq/L (ref 3.5–5.1)
Sodium: 136 mEq/L (ref 135–145)
Total Bilirubin: 1 mg/dL (ref 0.2–1.2)
Total Protein: 7 g/dL (ref 6.0–8.3)

## 2018-11-11 LAB — TSH: TSH: 1.64 u[IU]/mL (ref 0.35–4.50)

## 2018-11-11 LAB — PSA, MEDICARE: PSA: 0.42 ng/ml (ref 0.10–4.00)

## 2018-11-11 LAB — T4, FREE: Free T4: 0.82 ng/dL (ref 0.60–1.60)

## 2018-11-12 NOTE — Addendum Note (Signed)
Addended by: Lurlean Nanny on: 11/12/2018 02:08 PM   Modules accepted: Orders

## 2018-11-26 ENCOUNTER — Other Ambulatory Visit: Payer: Self-pay | Admitting: Internal Medicine

## 2018-11-26 DIAGNOSIS — I10 Essential (primary) hypertension: Secondary | ICD-10-CM

## 2018-11-27 ENCOUNTER — Encounter: Payer: Self-pay | Admitting: Internal Medicine

## 2018-11-27 DIAGNOSIS — I10 Essential (primary) hypertension: Secondary | ICD-10-CM

## 2018-11-29 MED ORDER — TRIAMTERENE-HCTZ 37.5-25 MG PO TABS: 1.0000 | ORAL_TABLET | Freq: Every day | ORAL | 2 refills | Status: DC

## 2018-12-24 ENCOUNTER — Other Ambulatory Visit: Payer: Self-pay | Admitting: Internal Medicine

## 2018-12-24 DIAGNOSIS — I10 Essential (primary) hypertension: Secondary | ICD-10-CM

## 2019-03-17 ENCOUNTER — Ambulatory Visit (INDEPENDENT_AMBULATORY_CARE_PROVIDER_SITE_OTHER): Payer: Medicare Other

## 2019-03-17 DIAGNOSIS — Z23 Encounter for immunization: Secondary | ICD-10-CM

## 2019-03-23 ENCOUNTER — Telehealth: Payer: Self-pay

## 2019-03-23 ENCOUNTER — Other Ambulatory Visit: Payer: Self-pay

## 2019-03-23 ENCOUNTER — Encounter (HOSPITAL_COMMUNITY): Payer: Self-pay

## 2019-03-23 ENCOUNTER — Emergency Department (HOSPITAL_COMMUNITY): Payer: Medicare Other

## 2019-03-23 ENCOUNTER — Emergency Department (HOSPITAL_COMMUNITY)
Admission: EM | Admit: 2019-03-23 | Discharge: 2019-03-23 | Disposition: A | Discharge status: Home / Self Care | Payer: Medicare Other | Attending: Emergency Medicine | Admitting: Emergency Medicine

## 2019-03-23 DIAGNOSIS — Z79899 Other long term (current) drug therapy: Secondary | ICD-10-CM | POA: Diagnosis not present

## 2019-03-23 DIAGNOSIS — I10 Essential (primary) hypertension: Secondary | ICD-10-CM | POA: Diagnosis not present

## 2019-03-23 DIAGNOSIS — R202 Paresthesia of skin: Secondary | ICD-10-CM | POA: Diagnosis not present

## 2019-03-23 DIAGNOSIS — R079 Chest pain, unspecified: Secondary | ICD-10-CM | POA: Diagnosis not present

## 2019-03-23 DIAGNOSIS — R0602 Shortness of breath: Secondary | ICD-10-CM | POA: Diagnosis not present

## 2019-03-23 DIAGNOSIS — R0789 Other chest pain: Secondary | ICD-10-CM | POA: Diagnosis not present

## 2019-03-23 LAB — BASIC METABOLIC PANEL
Anion gap: 12 (ref 5–15)
BUN: 14 mg/dL (ref 8–23)
CO2: 26 mmol/L (ref 22–32)
Calcium: 9.3 mg/dL (ref 8.9–10.3)
Chloride: 100 mmol/L (ref 98–111)
Creatinine, Ser: 1.06 mg/dL (ref 0.61–1.24)
GFR calc Af Amer: >60 mL/min (ref >60)
GFR calc non Af Amer: >60 mL/min (ref >60)
Glucose, Bld: 99 mg/dL (ref 70–99)
Potassium: 3.7 mmol/L (ref 3.5–5.1)
Sodium: 138 mmol/L (ref 135–145)

## 2019-03-23 LAB — CBC
HCT: 47.3 % (ref 39.0–52.0)
Hemoglobin: 16.8 g/dL (ref 13.0–17.0)
MCH: 31.6 pg (ref 26.0–34.0)
MCHC: 35.5 g/dL (ref 30.0–36.0)
MCV: 89.1 fL (ref 80.0–100.0)
Platelets: 185 10*3/uL (ref 150–400)
RBC: 5.31 MIL/uL (ref 4.22–5.81)
RDW: 12.4 % (ref 11.5–15.5)
WBC: 4.8 10*3/uL (ref 4.0–10.5)
nRBC: 0 % (ref 0.0–0.2)

## 2019-03-23 LAB — TROPONIN I (HIGH SENSITIVITY)
Troponin I (High Sensitivity): 3 ng/L (ref <18)
Troponin I (High Sensitivity): 3 ng/L (ref <18)

## 2019-03-23 MED ORDER — SODIUM CHLORIDE 0.9% FLUSH: 3.0000 mL | Freq: Once | INTRAVENOUS | Status: DC

## 2019-03-23 NOTE — ED Provider Notes (Signed)
[facility] EMERGENCY DEPARTMENT Provider Note   CSN: [id number] Arrival date & time: 03/23/19  1821     History   Chief Complaint No chief complaint on file.   HPI Brian Matthews is a 68 y.o. male hx of hypertension, hyperthyroidism here presenting with chest pain, left arm numbness.  Patient states that earlier today he had episode of chest pain lasting about several seconds.  States that it radiated to his left arm.  He called his doctor and was sent here for evaluation.  Patient denies any weakness or trouble speaking or history of strokes or heart attacks.  Denies any recent travel.     The history is provided by the patient.    Past Medical History:  Diagnosis Date  . Allergy   . Childhood asthma   . History of blood transfusion   . Hx of adenomatous polyp of colon 04/29/2017  . Hypertension   . Hyperthyroidism   . Positive TB test     Patient Active Problem List   Diagnosis Date Noted  . Subacute thyroiditis 11/18/2016  . Benign essential HTN 10/17/2014    Past Surgical History:  Procedure Laterality Date  . COLONOSCOPY  04/2017   Dr Carlean Purl        Home Medications    Prior to Admission medications   Medication Sig Start Date End Date Taking? Authorizing Provider  amLODipine (NORVASC) 5 MG tablet TAKE 1 TABLET BY MOUTH EVERY DAY 12/24/18   Jearld Fenton, NP  Cholecalciferol (VITAMIN D-3 PO) Take 1 capsule by mouth 2 (two) times a week.     [provider]  Cyanocobalamin (VITAMIN B-12 PO) Take 1 tablet by mouth 2 (two) times a week.     [provider]  Misc Natural Products (GLUCOS-CHONDROIT-MSM COMPLEX) TABS Take 3 tablets by mouth daily.    [provider]  Multiple Vitamin (MULTIVITAMIN) tablet Take 1 tablet by mouth daily.    [provider]  Omega-3 Fatty Acids (FISH OIL) 1200 MG CAPS Take 1 capsule by mouth 3 (three) times daily.     [provider]  OVER THE COUNTER MEDICATION  Prostate Blend 1000 mg 3  tablets  daily.    [provider]  Saw Palmetto 450 MG CAPS Take 6 capsules by mouth daily.     [provider]  triamterene-hydrochlorothiazide (MAXZIDE-25) 37.5-25 MG tablet Take 1 tablet by mouth daily. 11/29/18   Jearld Fenton, NP    Family History Family History  Problem Relation Age of Onset  . Hypertension Mother   . Arthritis Father   . Colon cancer Sister   . Thyroid disease Neg Hx   . Esophageal cancer Neg Hx   . Pancreatic cancer Neg Hx   . Rectal cancer Neg Hx   . Stomach cancer Neg Hx     Social History Social History   Tobacco Use  . Smoking status: Never Smoker  . Smokeless tobacco: Never Used  Substance Use Topics  . Alcohol use: No    Alcohol/week: 0.0 standard drinks  . Drug use: No     Allergies   Lisinopril and Penicillins   Review of Systems Review of Systems  Cardiovascular: Positive for chest pain.  Neurological: Positive for numbness.  All other systems reviewed and are negative.    Physical Exam Updated Vital Signs BP (!) 147/85 (BP Location: Right Arm)   Pulse 87   Temp 97.9 F (36.6 C) (Oral)   Resp 18  SpO2 97%   Physical Exam Vitals signs and nursing note reviewed.  HENT:     Head: Normocephalic.     Nose: Nose normal.     Mouth/Throat:     Mouth: Mucous membranes are moist.  Eyes:     Extraocular Movements: Extraocular movements intact.     Pupils: Pupils are equal, round, and reactive to light.  Neck:     Musculoskeletal: Normal range of motion.  Cardiovascular:     Rate and Rhythm: Normal rate and regular rhythm.     Pulses: Normal pulses.     Heart sounds: Normal heart sounds.  Pulmonary:     Effort: Pulmonary effort is normal.     Breath sounds: Normal breath sounds.  Abdominal:     General: Abdomen is flat.     Palpations: Abdomen is soft.  Musculoskeletal: Normal range of motion.  Skin:    General: Skin is warm.     Capillary Refill: Capillary refill takes  less than 2 seconds.  Neurological:     General: No focal deficit present.     Mental Status: He is alert.     Comments: CN 2- 12 intact, nl strength and sensation throughout. Nl finger to nose bilaterally, nl gait   Psychiatric:        Mood and Affect: Mood normal.      ED Treatments / Results  Labs (all labs ordered are listed, but only abnormal results are displayed) Labs Reviewed  BASIC METABOLIC PANEL  CBC  TROPONIN I (HIGH SENSITIVITY)  TROPONIN I (HIGH SENSITIVITY)    EKG EKG Interpretation  Date/Time:  Wednesday March 23 2019 18:30:16 EDT Ventricular Rate:  85 PR Interval:  154 QRS Duration: 82 QT Interval:  350 QTC Calculation: 416 R Axis:   81 Text Interpretation:  Normal sinus rhythm Possible Left atrial enlargement Borderline ECG No significant change since last tracing Confirmed by Wandra Arthurs [phone]) on 03/23/2019 10:07:21 PM   Radiology Dg Chest 2 View  Result Date: 03/23/2019 CLINICAL DATA:  Chest pain and shortness of breath. EXAM: CHEST - 2 VIEW COMPARISON:  11/10/2018 FINDINGS: The lungs are clear without focal pneumonia, edema, pneumothorax or pleural effusion. The cardiopericardial silhouette is within normal limits for size. The visualized bony structures of the thorax are intact. IMPRESSION: Stable.  No acute findings. Electronically Signed   By: Misty Stanley M.D.   On: 03/23/2019 19:31    Procedures Procedures (including critical care time)  Medications Ordered in ED Medications  sodium chloride flush (NS) 0.9 % injection 3 mL (3 mLs Intravenous Not Given 03/23/19 2210)     Initial Impression / Assessment and Plan / ED Course  I have reviewed the triage vital signs and the nursing notes.  Pertinent labs & imaging results that were available during my care of the patient were reviewed by me and considered in my medical decision making (see chart for details).       Brian Matthews is a 68 y.o. male here with chest pain, arm numbness.  Low risk for ACS and normal neuro exam. Will get labs, Trop x 2, CXR.   11:01 PM Trop neg x 2. Labs and CXR clear. Stable for discharge    Final Clinical Impressions(s) / ED Diagnoses   Final diagnoses:  Other chest pain  Paresthesia    ED Discharge Orders    None       Drenda Freeze, MD 03/23/19 2301

## 2019-03-23 NOTE — Telephone Encounter (Signed)
Spoke to pt and advised it is not best to wait until tomorrow to be evaluated that he should go to ED as they are able to run labs and get back faster and diagnostic studies if needed.... pt expressed understanding and stated he would think about within the next hour if he decides to go he will go to [facility]

## 2019-03-23 NOTE — ED Notes (Signed)
Updated on wait for treatment room.  PT states he is going to be outside.

## 2019-03-23 NOTE — ED Triage Notes (Signed)
Patient complains of CP and back pain that started earlier today with numbness in left hand. Reports that the chest discomfort is resolved. Also has some numbness in left toes. Alert and oriented, NAD

## 2019-03-23 NOTE — Discharge Instructions (Addendum)
Your heart enzymes are normal currently   See your doctor for follow up   Return to ER if you have worse chest pain, arm numbness or weakness.

## 2019-03-23 NOTE — Telephone Encounter (Signed)
Patient left voicemail stating that he wanted to try and get set up for EKG. He has been having some "funny" sensation in his chest, back sensations, numbness sensation in fingers and toes and would like to get his heart looked at.  Sending to [facility] for triage please.

## 2019-03-23 NOTE — Telephone Encounter (Signed)
I spoke with pt; numbness in fingers and toes especially lt side for couple years. Pt has chest discomfort like a needle stick to left of center of chest that last approx 10'; pt has had on and off and goes away on its own. Today pt had chest discomfort and at same time had dizziness room did not spin around but was lightheaded.also pt had mid back pain as well. No H/A and pt was SOB earlier today and pt does not remember if SOB went with Chest discomfort. No nausea or vomiting. Pt exercises with elliptical and weights for 1 hour 4-5 times a wk. No weakness in arms or legs,no vision changes. Now pt feels numbness in toes and fingers on lt side only. Pt does not want to go to ED or UC. Pt wants to know if can come to office on 03/24/19 to be seen. Pt has no covid symptoms except SOB, no travel and no known exposure to + covid. Pt request cb after Avie Echevaria NP reviews to see if can have in office visit at [facility]. ED precautions given and pt voiced understanding.

## 2019-03-24 NOTE — Telephone Encounter (Signed)
ER note reviewed

## 2019-04-30 ENCOUNTER — Other Ambulatory Visit: Payer: Self-pay

## 2019-04-30 ENCOUNTER — Ambulatory Visit (HOSPITAL_COMMUNITY)
Admission: EM | Admit: 2019-04-30 | Discharge: 2019-04-30 | Disposition: A | Discharge status: Home / Self Care | Payer: Medicare Other

## 2019-04-30 ENCOUNTER — Encounter (HOSPITAL_COMMUNITY): Payer: Self-pay | Admitting: *Deleted

## 2019-04-30 DIAGNOSIS — R21 Rash and other nonspecific skin eruption: Secondary | ICD-10-CM | POA: Diagnosis not present

## 2019-04-30 NOTE — ED Provider Notes (Signed)
[facility]    CSN: [id number] Arrival date & time: 04/30/19  1207      History   Chief Complaint Chief Complaint  Patient presents with  . Mass    HPI Brian Matthews is a 68 y.o. male.     Presents with a "spot" on his penis that appeared this am. It is non-painful. No drainage. No itching. No intercourse in over 2 years. No penile discharge or testicular pain. No fever or chills.      Past Medical History:  Diagnosis Date  . Allergy   . Childhood asthma   . History of blood transfusion   . Hx of adenomatous polyp of colon 04/29/2017  . Hypertension   . Hyperthyroidism   . Positive TB test     Patient Active Problem List   Diagnosis Date Noted  . Subacute thyroiditis 11/18/2016  . Benign essential HTN 10/17/2014    Past Surgical History:  Procedure Laterality Date  . COLONOSCOPY  04/2017   Dr Carlean Purl       Home Medications    Prior to Admission medications   Medication Sig Start Date End Date Taking? Authorizing Provider  amLODipine (NORVASC) 5 MG tablet TAKE 1 TABLET BY MOUTH EVERY DAY 12/24/18  Yes Baity, Coralie Keens, NP  Cholecalciferol (VITAMIN D-3 PO) Take 1 capsule by mouth 2 (two) times a week.    Yes [provider]  Cyanocobalamin (VITAMIN B-12 PO) Take 1 tablet by mouth 2 (two) times a week.    Yes [provider]  Misc Natural Products (GLUCOS-CHONDROIT-MSM COMPLEX) TABS Take 3 tablets by mouth daily.   Yes [provider]  Multiple Vitamin (MULTIVITAMIN) tablet Take 1 tablet by mouth daily.   Yes [provider]  Omega-3 Fatty Acids (FISH OIL) 1200 MG CAPS Take 1 capsule by mouth 3 (three) times daily.    Yes [provider]  OVER THE COUNTER MEDICATION Prostate Blend 1000 mg 3  tablets  daily.   Yes [provider]  Saw Palmetto 450 MG CAPS Take 6 capsules by mouth daily.    Yes [provider]  triamterene-hydrochlorothiazide (MAXZIDE-25) 37.5-25 MG tablet Take 1  tablet by mouth daily. 11/29/18  Yes Jearld Fenton, NP    Family History Family History  Problem Relation Age of Onset  . Hypertension Mother   . Arthritis Father   . Colon cancer Sister   . Thyroid disease Neg Hx   . Esophageal cancer Neg Hx   . Pancreatic cancer Neg Hx   . Rectal cancer Neg Hx   . Stomach cancer Neg Hx     Social History Social History   Tobacco Use  . Smoking status: Never Smoker  . Smokeless tobacco: Never Used  Substance Use Topics  . Alcohol use: No    Alcohol/week: 0.0 standard drinks  . Drug use: No     Allergies   Lisinopril and Penicillins   Review of Systems Review of Systems  All other systems reviewed and are negative.    Physical Exam Triage Vital Signs ED Triage Vitals  Enc Vitals Group     BP 04/30/19 1250 126/81     Pulse Rate 04/30/19 1250 89     Resp 04/30/19 1250 16     Temp 04/30/19 1250 98.3 F (36.8 C)     Temp Source 04/30/19 1250 Oral     SpO2 04/30/19 1250 97 %     Weight --      Height --  Head Circumference --      Peak Flow --      Pain Score 04/30/19 1251 0     Pain Loc --      Pain Edu? --      Excl. in [place]? --    No data found.  Updated Vital Signs BP 126/81   Pulse 89   Temp 98.3 F (36.8 C) (Oral)   Resp 16   SpO2 97%   Visual Acuity Right Eye Distance:   Left Eye Distance:   Bilateral Distance:    Right Eye Near:   Left Eye Near:    Bilateral Near:     Physical Exam Vitals signs and nursing note reviewed.  Constitutional:      Appearance: Normal appearance.  HENT:     Head: Normocephalic and atraumatic.  Eyes:     General: No scleral icterus. Genitourinary:    Comments: Tiny (pin head) pearly area on base of penis, no pain to palpation, no drainage. Remainder of exam is normal Skin:    General: Skin is warm and dry.  Neurological:     Mental Status: He is alert.  Psychiatric:        Thought Content: Thought content normal.      UC Treatments / Results  Labs (all  labs ordered are listed, but only abnormal results are displayed) Labs Reviewed - No data to display  EKG   Radiology No results found.  Procedures Procedures (including critical care time)  Medications Ordered in UC Medications - No data to display  Initial Impression / Assessment and Plan / UC Course  I have reviewed the triage vital signs and the nursing notes.  Pertinent labs & imaging results that were available during my care of the patient were reviewed by me and considered in my medical decision making (see chart for details).    Pearly white penile papule vs. Small fordyce spot at this time. No evidence of an emergent situation. If worsens in any way then f/u with his PCP for further evaluation. Reassurance given based on presentation and exam today.  Final Clinical Impressions(s) / UC Diagnoses   Final diagnoses:  None   Discharge Instructions   None    ED Prescriptions    None     PDMP not reviewed this encounter.   Bjorn Pippin, PA-C 04/30/19 1316

## 2019-04-30 NOTE — ED Triage Notes (Signed)
Reports waking up this AM with non-tender "bump" in scrotal area.  States hasn't been sexually active "x yrs".

## 2019-04-30 NOTE — Discharge Instructions (Addendum)
This looks like a pearly penile papule or a small sebaceous cyst that come and go and are generally benign. It does not look like infection or viral and at this time does not appear harmful. Certainly if this enlarges or worsens in any way then f/u with your PCP. Hopefully this will go away.

## 2019-05-09 ENCOUNTER — Encounter: Payer: Self-pay | Admitting: Internal Medicine

## 2019-05-10 ENCOUNTER — Other Ambulatory Visit: Payer: Self-pay

## 2019-05-10 ENCOUNTER — Encounter: Payer: Self-pay | Admitting: Internal Medicine

## 2019-05-10 ENCOUNTER — Ambulatory Visit (INDEPENDENT_AMBULATORY_CARE_PROVIDER_SITE_OTHER): Payer: Medicare Other | Admitting: Internal Medicine

## 2019-05-10 VITALS — BP 140/80 | HR 74 | Temp 98.2°F | Ht 72.25 in | Wt 166.2 lb

## 2019-05-10 DIAGNOSIS — N489 Disorder of penis, unspecified: Secondary | ICD-10-CM

## 2019-05-10 MED ORDER — VALACYCLOVIR HCL 500 MG PO TABS: 500.0000 mg | ORAL_TABLET | Freq: Two times a day (BID) | ORAL | 0 refills | Status: DC

## 2019-05-10 NOTE — Progress Notes (Signed)
Acute Office Visit  Subjective:    Patient ID: Brian Matthews, male    DOB: October 02, 1950, 68 y.o.   MRN: [id number]  Chief Complaint  Patient presents with  . Cyst on Private Area    HPI   Pt presents to the clinic today for follow up of penile lesion from urgent care visit on 04/30/2019. Patient states that the lesion appeared on November 14th. He describes it as a penny-sized, round and had initial scabbing in the center. He mentions the lesion is non-painful and denies discharge, swelling, fevers or chills. He was told at [facility] that this was a sebaceous cyst but he questions this diagnosis. He remembers being treated with anti-virals years ago for a similar lesion but the provider did not tell him what the lesion was. He has not been sexually active in years.  Past Medical History:  Diagnosis Date  . Allergy   . Childhood asthma   . History of blood transfusion   . Hx of adenomatous polyp of colon 04/29/2017  . Hypertension   . Hyperthyroidism   . Positive TB test     Past Surgical History:  Procedure Laterality Date  . COLONOSCOPY  04/2017   Dr Carlean Purl    Family History  Problem Relation Age of Onset  . Hypertension Mother   . Arthritis Father   . Colon cancer Sister   . Thyroid disease Neg Hx   . Esophageal cancer Neg Hx   . Pancreatic cancer Neg Hx   . Rectal cancer Neg Hx   . Stomach cancer Neg Hx     Social History   Socioeconomic History  . Marital status: Divorced    Spouse name: Not on file  . Number of children: Not on file  . Years of education: Not on file  . Highest education level: Not on file  Occupational History  . Not on file  Social Needs  . Financial resource strain: Not on file  . Food insecurity    Worry: Not on file    Inability: Not on file  . Transportation needs    Medical: Not on file    Non-medical: Not on file  Tobacco Use  . Smoking status: Never Smoker  . Smokeless tobacco: Never Used  Substance and Sexual Activity  .  Alcohol use: No    Alcohol/week: 0.0 standard drinks  . Drug use: No  . Sexual activity: Not on file  Lifestyle  . Physical activity    Days per week: Not on file    Minutes per session: Not on file  . Stress: Not on file  Relationships  . Social Herbalist on phone: Not on file    Gets together: Not on file    Attends religious service: Not on file    Active member of club or organization: Not on file    Attends meetings of clubs or organizations: Not on file    Relationship status: Not on file  . Intimate partner violence    Fear of current or ex partner: Not on file    Emotionally abused: Not on file    Physically abused: Not on file    Forced sexual activity: Not on file  Other Topics Concern  . Not on file  Social History Narrative  . Not on file    Outpatient Medications Prior to Visit  Medication Sig Dispense Refill  . amLODipine (NORVASC) 5 MG tablet TAKE 1 TABLET BY MOUTH EVERY DAY 90  tablet 2  . Cholecalciferol (VITAMIN D-3 PO) Take 1 capsule by mouth 2 (two) times a week.     . Cyanocobalamin (VITAMIN B-12 PO) Take 1 tablet by mouth 2 (two) times a week.     . Misc Natural Products (GLUCOS-CHONDROIT-MSM COMPLEX) TABS Take 3 tablets by mouth daily.    . Multiple Vitamin (MULTIVITAMIN) tablet Take 1 tablet by mouth daily.    . Omega-3 Fatty Acids (FISH OIL) 1200 MG CAPS Take 1 capsule by mouth 3 (three) times daily.     Marland Kitchen OVER THE COUNTER MEDICATION Prostate Blend 1000 mg 3  tablets  daily.    . Saw Palmetto 450 MG CAPS Take 6 capsules by mouth daily.     Marland Kitchen triamterene-hydrochlorothiazide (MAXZIDE-25) 37.5-25 MG tablet Take 1 tablet by mouth daily. 90 tablet 2   No facility-administered medications prior to visit.     Allergies  Allergen Reactions  . Lisinopril Other (See Comments)    cough  . Penicillins Other (See Comments)    Does not remember    ROS   Constitutional: Denies fever, malaise, fatigue, headache or abrupt weight changes.   Respiratory: Denies difficulty breathing, shortness of breath, cough or sputum production.   Cardiovascular: Denies chest pain, chest tightness, palpitations or swelling in the hands or feet.  GU: Pt reports penile lesion. Denies urgency, frequency, pain with urination, burning sensation, blood in urine, odor or discharge. Skin: Denies rashes or erythema.   No other specific complaints in a complete review of systems (except as listed in HPI above).     Objective:    Physical Exam  BP 140/80   Pulse 74   Temp 98.2 F (36.8 C) (Temporal)   Ht 6' 0.25" (1.835 m)   Wt 166 lb 4 oz (75.4 kg)   SpO2 96%   BMI 22.39 kg/m   General: Appears his stated age, well developed in NAD.  Cardiovascular: Normal rate and rhythm.  Pulmonary/Chest: Normal effort and positive vesicular breath sounds. No respiratory distress. No wheezes, rales or ronchi noted.   GU: There is a round, painless lesion noted to mid-penile shaft measuring approximately 2 mm in diameter. No discharge, erythema or swelling noted.     BMET    Component Value Date/Time   NA 138 03/23/2019 1832   K 3.7 03/23/2019 1832   CL 100 03/23/2019 1832   CO2 26 03/23/2019 1832   GLUCOSE 99 03/23/2019 1832   BUN 14 03/23/2019 1832   CREATININE 1.06 03/23/2019 1832   CREATININE 1.18 02/22/2016 1638   CALCIUM 9.3 03/23/2019 1832   GFRNONAA >60 03/23/2019 1832   GFRAA >60 03/23/2019 1832    Lipid Panel     Component Value Date/Time   CHOL 175 11/10/2018 1620   TRIG 69.0 11/10/2018 1620   HDL 38.50 (L) 11/10/2018 1620   CHOLHDL 5 11/10/2018 1620   VLDL 13.8 11/10/2018 1620   LDLCALC 122 (H) 11/10/2018 1620    CBC    Component Value Date/Time   WBC 4.8 03/23/2019 1832   RBC 5.31 03/23/2019 1832   HGB 16.8 03/23/2019 1832   HCT 47.3 03/23/2019 1832   PLT 185 03/23/2019 1832   MCV 89.1 03/23/2019 1832   MCH 31.6 03/23/2019 1832   MCHC 35.5 03/23/2019 1832   RDW 12.4 03/23/2019 1832    Hgb A1C Lab Results   Component Value Date   HGBA1C 5.3 02/22/2016          Assessment & Plan:  Penile Lesion  DDX: Syphylis, Genital Herpes, Condyloma Acuminata  Ordered Valtrex 500 mg X 3 days Will obtain labs for genital herpes and syphylis     Leward Quan, Student-PA

## 2019-05-10 NOTE — Progress Notes (Signed)
Subjective:    Patient ID: Brian Matthews, male    DOB: [birth year], 68 y.o.   MRN: [id number]  HPI  Pt presents to the clinic today for follow up of penile lesion from urgent care visit on 04/30/2019. Patient states that the lesion appeared that morning. He describes it as small, round and  had initial scabbing in the center. He mentions the lesion is non-painful and denies discharge, swelling. He was told at [facility] that this was a sebaceous cyst but he questions this diagnosis. He remembers being treated with anti-virals years ago for a similar lesion but the provider did not tell him what the lesion was. He has not been sexually active in years.  Review of Systems      Past Medical History:  Diagnosis Date  . Allergy   . Childhood asthma   . History of blood transfusion   . Hx of adenomatous polyp of colon 04/29/2017  . Hypertension   . Hyperthyroidism   . Positive TB test     Current Outpatient Medications  Medication Sig Dispense Refill  . amLODipine (NORVASC) 5 MG tablet TAKE 1 TABLET BY MOUTH EVERY DAY 90 tablet 2  . Cholecalciferol (VITAMIN D-3 PO) Take 1 capsule by mouth 2 (two) times a week.     . Cyanocobalamin (VITAMIN B-12 PO) Take 1 tablet by mouth 2 (two) times a week.     . Misc Natural Products (GLUCOS-CHONDROIT-MSM COMPLEX) TABS Take 3 tablets by mouth daily.    . Multiple Vitamin (MULTIVITAMIN) tablet Take 1 tablet by mouth daily.    . Omega-3 Fatty Acids (FISH OIL) 1200 MG CAPS Take 1 capsule by mouth 3 (three) times daily.     Marland Kitchen OVER THE COUNTER MEDICATION Prostate Blend 1000 mg 3  tablets  daily.    . Saw Palmetto 450 MG CAPS Take 6 capsules by mouth daily.     Marland Kitchen triamterene-hydrochlorothiazide (MAXZIDE-25) 37.5-25 MG tablet Take 1 tablet by mouth daily. 90 tablet 2  . valACYclovir (VALTREX) 500 MG tablet Take 1 tablet (500 mg total) by mouth 2 (two) times daily. 6 tablet 0   No current facility-administered medications for this visit.     Allergies   Allergen Reactions  . Lisinopril Other (See Comments)    cough  . Penicillins Other (See Comments)    Does not remember    Family History  Problem Relation Age of Onset  . Hypertension Mother   . Arthritis Father   . Colon cancer Sister   . Thyroid disease Neg Hx   . Esophageal cancer Neg Hx   . Pancreatic cancer Neg Hx   . Rectal cancer Neg Hx   . Stomach cancer Neg Hx     Social History   Socioeconomic History  . Marital status: Divorced    Spouse name: Not on file  . Number of children: Not on file  . Years of education: Not on file  . Highest education level: Not on file  Occupational History  . Not on file  Social Needs  . Financial resource strain: Not on file  . Food insecurity    Worry: Not on file    Inability: Not on file  . Transportation needs    Medical: Not on file    Non-medical: Not on file  Tobacco Use  . Smoking status: Never Smoker  . Smokeless tobacco: Never Used  Substance and Sexual Activity  . Alcohol use: No    Alcohol/week: 0.0 standard drinks  .  Drug use: No  . Sexual activity: Not on file  Lifestyle  . Physical activity    Days per week: Not on file    Minutes per session: Not on file  . Stress: Not on file  Relationships  . Social Herbalist on phone: Not on file    Gets together: Not on file    Attends religious service: Not on file    Active member of club or organization: Not on file    Attends meetings of clubs or organizations: Not on file    Relationship status: Not on file  . Intimate partner violence    Fear of current or ex partner: Not on file    Emotionally abused: Not on file    Physically abused: Not on file    Forced sexual activity: Not on file  Other Topics Concern  . Not on file  Social History Narrative  . Not on file     Constitutional: Denies fever, malaise, fatigue, headache or abrupt weight changes.  Gastrointestinal: Denies abdominal pain, bloating, constipation, diarrhea or blood in  the stool.  GU: Denies urgency, frequency, pain with urination, burning sensation, blood in urine, odor or discharge. Skin: Pt reports penile lesion. Denies redness, rashes, or ulcercations.    No other specific complaints in a complete review of systems (except as listed in HPI above).  Objective:   Physical Exam  BP 140/80   Pulse 74   Temp 98.2 F (36.8 C) (Temporal)   Ht 6' 0.25" (1.835 m)   Wt 166 lb 4 oz (75.4 kg)   SpO2 96%   BMI 22.39 kg/m  Wt Readings from Last 3 Encounters:  05/10/19 166 lb 4 oz (75.4 kg)  11/10/18 166 lb (75.3 kg)  09/30/18 169 lb (76.7 kg)    General: Appears his stated age, well developed, well nourished in NAD. Skin: 2 mm round, slightly raised skin lesion of midshaft of penis. No drainage, ulceration or redness noted. Cardiovascular: Normal rate and rhythm. S1,S2 noted.  No murmur, rubs or gallops noted.  Pulmonary/Chest: Normal effort and positive vesicular breath sounds. No respiratory distress. No wheezes, rales or ronchi noted.  Abdomen: Soft and nontender. Normal bowel sounds. No distention or masses noted.  Neurological: Alert and oriented.    BMET    Component Value Date/Time   NA 138 03/23/2019 1832   K 3.7 03/23/2019 1832   CL 100 03/23/2019 1832   CO2 26 03/23/2019 1832   GLUCOSE 99 03/23/2019 1832   BUN 14 03/23/2019 1832   CREATININE 1.06 03/23/2019 1832   CREATININE 1.18 02/22/2016 1638   CALCIUM 9.3 03/23/2019 1832   GFRNONAA >60 03/23/2019 1832   GFRAA >60 03/23/2019 1832    Lipid Panel     Component Value Date/Time   CHOL 175 11/10/2018 1620   TRIG 69.0 11/10/2018 1620   HDL 38.50 (L) 11/10/2018 1620   CHOLHDL 5 11/10/2018 1620   VLDL 13.8 11/10/2018 1620   LDLCALC 122 (H) 11/10/2018 1620    CBC    Component Value Date/Time   WBC 4.8 03/23/2019 1832   RBC 5.31 03/23/2019 1832   HGB 16.8 03/23/2019 1832   HCT 47.3 03/23/2019 1832   PLT 185 03/23/2019 1832   MCV 89.1 03/23/2019 1832   MCH 31.6  03/23/2019 1832   MCHC 35.5 03/23/2019 1832   RDW 12.4 03/23/2019 1832    Hgb A1C Lab Results  Component Value Date   HGBA1C 5.3 02/22/2016  Assessment & Plan:   Penile Lesion:  UC notes reviewed DDx include HSV, genital wart vs chancre Will obtain HSV 1&2 IgG and IgM, RPR Insurance denied payment for HIV RX for Valtrex 500 mg BID x 3 days  Will follow up after labs are back, return precautions discussed Webb Silversmith, NP This visit occurred during the SARS-CoV-2 public health emergency.  Safety protocols were in place, including screening questions prior to the visit, additional usage of staff PPE, and extensive cleaning of exam room while observing appropriate contact time as indicated for disinfecting solutions.

## 2019-05-14 LAB — HSV(HERPES SIMPLEX VRS) I + II AB-IGG
HAV 1 IGG,TYPE SPECIFIC AB: 1.22 index — ABNORMAL HIGH
HSV 2 IGG,TYPE SPECIFIC AB: <0.9 index

## 2019-05-14 LAB — HSV 1/2 AB (IGM), IFA W/RFLX TITER
HSV 1 IgM Screen: NEGATIVE
HSV 2 IgM Screen: NEGATIVE

## 2019-05-14 LAB — RPR: RPR Ser Ql: NONREACTIVE

## 2019-05-16 ENCOUNTER — Other Ambulatory Visit: Payer: Self-pay | Admitting: Internal Medicine

## 2019-05-16 ENCOUNTER — Encounter: Payer: Self-pay | Admitting: Internal Medicine

## 2019-05-16 MED ORDER — IMIQUIMOD 5 % EX CREA: TOPICAL_CREAM | Freq: Every day | CUTANEOUS | 0 refills | Status: DC

## 2019-05-23 ENCOUNTER — Encounter: Payer: Self-pay | Admitting: Internal Medicine

## 2019-05-26 DIAGNOSIS — R208 Other disturbances of skin sensation: Secondary | ICD-10-CM | POA: Diagnosis not present

## 2019-05-26 DIAGNOSIS — B078 Other viral warts: Secondary | ICD-10-CM | POA: Diagnosis not present

## 2019-05-27 ENCOUNTER — Encounter: Payer: Self-pay | Admitting: Internal Medicine

## 2019-06-02 ENCOUNTER — Encounter: Payer: Self-pay | Admitting: Internal Medicine

## 2019-06-28 DIAGNOSIS — B078 Other viral warts: Secondary | ICD-10-CM | POA: Diagnosis not present

## 2019-08-10 ENCOUNTER — Encounter: Payer: Self-pay | Admitting: Internal Medicine

## 2019-08-18 ENCOUNTER — Other Ambulatory Visit: Payer: Self-pay

## 2019-08-18 ENCOUNTER — Ambulatory Visit (INDEPENDENT_AMBULATORY_CARE_PROVIDER_SITE_OTHER): Payer: Medicare Other | Admitting: Internal Medicine

## 2019-08-18 ENCOUNTER — Encounter: Payer: Self-pay | Admitting: Internal Medicine

## 2019-08-18 ENCOUNTER — Ambulatory Visit (INDEPENDENT_AMBULATORY_CARE_PROVIDER_SITE_OTHER)
Admission: RE | Admit: 2019-08-18 | Discharge: 2019-08-18 | Disposition: A | Discharge status: Home / Self Care | Payer: Medicare Other | Source: Ambulatory Visit | Attending: Internal Medicine | Admitting: Internal Medicine

## 2019-08-18 VITALS — BP 128/82 | HR 87 | Temp 97.3°F | Wt 169.0 lb

## 2019-08-18 DIAGNOSIS — L819 Disorder of pigmentation, unspecified: Secondary | ICD-10-CM

## 2019-08-18 DIAGNOSIS — M546 Pain in thoracic spine: Secondary | ICD-10-CM | POA: Diagnosis not present

## 2019-08-18 DIAGNOSIS — R351 Nocturia: Secondary | ICD-10-CM | POA: Diagnosis not present

## 2019-08-18 DIAGNOSIS — R202 Paresthesia of skin: Secondary | ICD-10-CM

## 2019-08-18 DIAGNOSIS — G8929 Other chronic pain: Secondary | ICD-10-CM

## 2019-08-18 DIAGNOSIS — M47816 Spondylosis without myelopathy or radiculopathy, lumbar region: Secondary | ICD-10-CM | POA: Diagnosis not present

## 2019-08-18 NOTE — Progress Notes (Signed)
Subjective:    Patient ID: Brian Matthews, male    DOB: May 23, 1951, 69 y.o.   MRN: [id number]  HPI  Pt presents to the clinic today with c/o intermittent back pain. He reports this occurs more on the left than the right. The pain does not radiate. He has some numbness of his bilateral pinky fingers but denies tingling or weakness. He denies any previous upper back injury.  He also reports intermittent pain and numbness in his bilateral feet, left worse than the right. He describes the pain as achy. He denies low back pain, hip or knee pain. He can not think of anything that makes the pain/numbness occur or what makes it better. He denies any low back injury. He has no history of diabetes. He has had his Vit D, B12 and Thyroid checked prior.  He also c/o of white discoloration of bilateral fingertips. He noticed this 1 week ago. It does seem to be improving. He reports his hands don't feel colder than usual but that he always feels cold. He denies associated pain, numbness or tingling associated with the discoloration. He intermittently has numbness of bilateral pinky fingers. He has not tried anything OTC for this.  He also reports nocturia. He gets up 1-2 times per night. He is able to fall back asleep easily. He is taking Saw Palmetto and a prostate blend OTC.  Review of Systems      Past Medical History:  Diagnosis Date  . Allergy   . Childhood asthma   . History of blood transfusion   . Hx of adenomatous polyp of colon 04/29/2017  . Hypertension   . Hyperthyroidism   . Positive TB test     Current Outpatient Medications  Medication Sig Dispense Refill  . amLODipine (NORVASC) 5 MG tablet TAKE 1 TABLET BY MOUTH EVERY DAY 90 tablet 2  . Cholecalciferol (VITAMIN D-3 PO) Take 1 capsule by mouth 2 (two) times a week.     . Cyanocobalamin (VITAMIN B-12 PO) Take 1 tablet by mouth 2 (two) times a week.     . imiquimod (ALDARA) 5 % cream Apply topically at bedtime. 21 each 0  . Misc  Natural Products (GLUCOS-CHONDROIT-MSM COMPLEX) TABS Take 3 tablets by mouth daily.    . Multiple Vitamin (MULTIVITAMIN) tablet Take 1 tablet by mouth daily.    . Omega-3 Fatty Acids (FISH OIL) 1200 MG CAPS Take 1 capsule by mouth 3 (three) times daily.     Marland Kitchen OVER THE COUNTER MEDICATION Prostate Blend 1000 mg 3  tablets  daily.    . Saw Palmetto 450 MG CAPS Take 6 capsules by mouth daily.     Marland Kitchen triamterene-hydrochlorothiazide (MAXZIDE-25) 37.5-25 MG tablet Take 1 tablet by mouth daily. 90 tablet 2  . valACYclovir (VALTREX) 500 MG tablet Take 1 tablet (500 mg total) by mouth 2 (two) times daily. 6 tablet 0   No current facility-administered medications for this visit.    Allergies  Allergen Reactions  . Lisinopril Other (See Comments)    cough  . Penicillins Other (See Comments)    Does not remember    Family History  Problem Relation Age of Onset  . Hypertension Mother   . Arthritis Father   . Colon cancer Sister   . Thyroid disease Neg Hx   . Esophageal cancer Neg Hx   . Pancreatic cancer Neg Hx   . Rectal cancer Neg Hx   . Stomach cancer Neg Hx     Social History  Socioeconomic History  . Marital status: Divorced    Spouse name: Not on file  . Number of children: Not on file  . Years of education: Not on file  . Highest education level: Not on file  Occupational History  . Not on file  Tobacco Use  . Smoking status: Never Smoker  . Smokeless tobacco: Never Used  Substance and Sexual Activity  . Alcohol use: No    Alcohol/week: 0.0 standard drinks  . Drug use: No  . Sexual activity: Not on file  Other Topics Concern  . Not on file  Social History Narrative  . Not on file   Social Determinants of Health   Financial Resource Strain:   . Difficulty of Paying Living Expenses: Not on file  Food Insecurity:   . Worried About Charity fundraiser in the Last Year: Not on file  . Ran Out of Food in the Last Year: Not on file  Transportation Needs:   . Lack of  Transportation (Medical): Not on file  . Lack of Transportation (Non-Medical): Not on file  Physical Activity:   . Days of Exercise per Week: Not on file  . Minutes of Exercise per Session: Not on file  Stress:   . Feeling of Stress : Not on file  Social Connections:   . Frequency of Communication with Friends and Family: Not on file  . Frequency of Social Gatherings with Friends and Family: Not on file  . Attends Religious Services: Not on file  . Active Member of Clubs or Organizations: Not on file  . Attends Archivist Meetings: Not on file  . Marital Status: Not on file  Intimate Partner Violence:   . Fear of Current or Ex-Partner: Not on file  . Emotionally Abused: Not on file  . Physically Abused: Not on file  . Sexually Abused: Not on file     Constitutional: Denies fever, malaise, fatigue, headache or abrupt weight changes.  Respiratory: Denies difficulty breathing, shortness of breath, cough or sputum production.   Cardiovascular: Denies chest pain, chest tightness, palpitations or swelling in the hands or feet.  Gastrointestinal: Denies abdominal pain, bloating, constipation, diarrhea or blood in the stool.  GU: Pt reports nocturia. Denies urgency, frequency, pain with urination, burning sensation, blood in urine, odor or discharge. Musculoskeletal: Pt reports upper back pain, bilateral lower leg pain. Denies decrease in range of motion, difficulty with gait, or joint pain and swelling.  Skin: Pt reports white discoloration of fingers. Denies redness, rashes, lesions or ulcercations.  Neurological: Pt reports intermittent numbness of BLE. Denies dizziness, difficulty with memory, difficulty with speech or problems with balance and coordination.    No other specific complaints in a complete review of systems (except as listed in HPI above).  Objective:   Physical Exam BP 128/82   Pulse 87   Temp (!) 97.3 F (36.3 C) (Temporal)   Wt 169 lb (76.7 kg)   SpO2  98%   BMI 22.76 kg/m   Wt Readings from Last 3 Encounters:  05/10/19 166 lb 4 oz (75.4 kg)  11/10/18 166 lb (75.3 kg)  09/30/18 169 lb (76.7 kg)    General: Appears his stated age, well developed, well nourished in NAD. Skin: Warm, dry and intact. White discoloration of bilateral fingers 1-5 from DIP to tip of finger. Fingertips cool but not tender to touch. Cardiovascular: Normal rate and rhythm. Radial pulses 2+. Cap refill < 3 secs. Pulmonary/Chest: Normal effort and positive vesicular breath  sounds. No respiratory distress. No wheezes, rales or ronchi noted.  Musculoskeletal: Normal flexion, extension, rotation and lateral bending of the spine. No bony tenderness noted over the spine. Some discomfort with palpation of the left subscapular area. Able to stand on heels and toes. No difficulty with gait. Neurological: Alert and oriented. Sensation intact to BLE.   BMET    Component Value Date/Time   NA 138 03/23/2019 1832   K 3.7 03/23/2019 1832   CL 100 03/23/2019 1832   CO2 26 03/23/2019 1832   GLUCOSE 99 03/23/2019 1832   BUN 14 03/23/2019 1832   CREATININE 1.06 03/23/2019 1832   CREATININE 1.18 02/22/2016 1638   CALCIUM 9.3 03/23/2019 1832   GFRNONAA >60 03/23/2019 1832   GFRAA >60 03/23/2019 1832    Lipid Panel     Component Value Date/Time   CHOL 175 11/10/2018 1620   TRIG 69.0 11/10/2018 1620   HDL 38.50 (L) 11/10/2018 1620   CHOLHDL 5 11/10/2018 1620   VLDL 13.8 11/10/2018 1620   LDLCALC 122 (H) 11/10/2018 1620    CBC    Component Value Date/Time   WBC 4.8 03/23/2019 1832   RBC 5.31 03/23/2019 1832   HGB 16.8 03/23/2019 1832   HCT 47.3 03/23/2019 1832   PLT 185 03/23/2019 1832   MCV 89.1 03/23/2019 1832   MCH 31.6 03/23/2019 1832   MCHC 35.5 03/23/2019 1832   RDW 12.4 03/23/2019 1832    Hgb A1C Lab Results  Component Value Date   HGBA1C 5.3 02/22/2016           Assessment & Plan:   Thoracic Back Pain:  Likely muscular Xray thoracic  spine today  Paresthesia of BLE:  No need to repeat TSH, Vit D or B12 Xray lumbar spine today Consider referral to neurology for EMG testing pending labs  Discoloration of Fingertips:  Not clearly Raynauds Improving per pt Will monitor at this time  Nocturia:  Likely BPH Offered  Flomax but will hold off at this time Continue Saw Palmetto and Prostate Blend  Will follow up after xrays are back, return precautions discussed   Webb Silversmith, NP This visit occurred during the SARS-CoV-2 public health emergency.  Safety protocols were in place, including screening questions prior to the visit, additional usage of staff PPE, and extensive cleaning of exam room while observing appropriate contact time as indicated for disinfecting solutions.

## 2019-08-18 NOTE — Patient Instructions (Signed)

## 2019-08-19 ENCOUNTER — Encounter: Payer: Self-pay | Admitting: Internal Medicine

## 2019-08-19 ENCOUNTER — Other Ambulatory Visit: Payer: Self-pay | Admitting: Internal Medicine

## 2019-08-19 DIAGNOSIS — M816 Localized osteoporosis [Lequesne]: Secondary | ICD-10-CM

## 2019-08-25 ENCOUNTER — Other Ambulatory Visit: Payer: Self-pay | Admitting: Internal Medicine

## 2019-08-25 DIAGNOSIS — I10 Essential (primary) hypertension: Secondary | ICD-10-CM

## 2019-09-07 ENCOUNTER — Ambulatory Visit
Admission: RE | Admit: 2019-09-07 | Discharge: 2019-09-07 | Disposition: A | Discharge status: Home / Self Care | Payer: Medicare Other | Source: Ambulatory Visit | Attending: Internal Medicine | Admitting: Internal Medicine

## 2019-09-07 DIAGNOSIS — M816 Localized osteoporosis [Lequesne]: Secondary | ICD-10-CM | POA: Diagnosis not present

## 2019-09-07 DIAGNOSIS — M85852 Other specified disorders of bone density and structure, left thigh: Secondary | ICD-10-CM | POA: Diagnosis not present

## 2019-09-08 ENCOUNTER — Encounter: Payer: Self-pay | Admitting: Internal Medicine

## 2019-09-12 ENCOUNTER — Encounter: Payer: Self-pay | Admitting: Internal Medicine

## 2019-09-20 ENCOUNTER — Other Ambulatory Visit: Payer: Self-pay | Admitting: Internal Medicine

## 2019-09-20 DIAGNOSIS — I10 Essential (primary) hypertension: Secondary | ICD-10-CM

## 2019-09-26 ENCOUNTER — Encounter: Payer: Self-pay | Admitting: Internal Medicine

## 2019-10-20 ENCOUNTER — Encounter: Payer: Self-pay | Admitting: Internal Medicine

## 2019-10-21 ENCOUNTER — Encounter: Payer: Self-pay | Admitting: Internal Medicine

## 2019-10-21 ENCOUNTER — Other Ambulatory Visit: Payer: Self-pay

## 2019-10-21 ENCOUNTER — Ambulatory Visit (INDEPENDENT_AMBULATORY_CARE_PROVIDER_SITE_OTHER): Payer: Medicare Other | Admitting: Internal Medicine

## 2019-10-21 DIAGNOSIS — N489 Disorder of penis, unspecified: Secondary | ICD-10-CM | POA: Diagnosis not present

## 2019-10-21 NOTE — Assessment & Plan Note (Signed)
Totally reassured him This is part of a normal vein (very slight dilation proximally) Not wart, not precancer Normal variant

## 2019-10-21 NOTE — Progress Notes (Signed)
Subjective:    Patient ID: Brian Matthews, male    DOB: 1950-12-29, 69 y.o.   MRN: [id number]  HPI Here due to a lump on his penis This visit occurred during the SARS-CoV-2 public health emergency.  Safety protocols were in place, including screening questions prior to the visit, additional usage of staff PPE, and extensive cleaning of exam room while observing appropriate contact time as indicated for disinfecting solutions.   Had lesion on penis in November Eventually diagnosed as genital wart Got liquid nitrogen at dermatologist Did go away--can barely see where it was  Now with new raised area under skin Now at base of shaft of penis Noticed this about a week ago No pain--but will have "a twinge" at times  Current Outpatient Medications on File Prior to Visit  Medication Sig Dispense Refill  . amLODipine (NORVASC) 5 MG tablet TAKE 1 TABLET BY MOUTH EVERY DAY 90 tablet 0  . Cholecalciferol (VITAMIN D-3 PO) Take 1 capsule by mouth 2 (two) times a week.     . Cyanocobalamin (VITAMIN B-12 PO) Take 1 tablet by mouth 2 (two) times a week.     . Misc Natural Products (GLUCOS-CHONDROIT-MSM COMPLEX) TABS Take 3 tablets by mouth daily.    . Multiple Vitamin (MULTIVITAMIN) tablet Take 1 tablet by mouth daily.    . Omega-3 Fatty Acids (FISH OIL) 1200 MG CAPS Take 1 capsule by mouth 3 (three) times daily.     Marland Kitchen OVER THE COUNTER MEDICATION Prostate Blend 1000 mg 3  tablets  daily.    . Saw Palmetto 450 MG CAPS Take 6 capsules by mouth daily.     Marland Kitchen triamterene-hydrochlorothiazide (MAXZIDE-25) 37.5-25 MG tablet TAKE 1 TABLET BY MOUTH DAILY 90 tablet 0   No current facility-administered medications on file prior to visit.    Allergies  Allergen Reactions  . Lisinopril Other (See Comments)    cough  . Penicillins Other (See Comments)    Does not remember    Past Medical History:  Diagnosis Date  . Allergy   . Childhood asthma   . History of blood transfusion   . Hx of  adenomatous polyp of colon 04/29/2017  . Hypertension   . Hyperthyroidism   . Positive TB test     Past Surgical History:  Procedure Laterality Date  . COLONOSCOPY  04/2017   Dr Carlean Purl    Family History  Problem Relation Age of Onset  . Hypertension Mother   . Arthritis Father   . Colon cancer Sister   . Thyroid disease Neg Hx   . Esophageal cancer Neg Hx   . Pancreatic cancer Neg Hx   . Rectal cancer Neg Hx   . Stomach cancer Neg Hx     Social History   Socioeconomic History  . Marital status: Divorced    Spouse name: Not on file  . Number of children: Not on file  . Years of education: Not on file  . Highest education level: Not on file  Occupational History  . Not on file  Tobacco Use  . Smoking status: Never Smoker  . Smokeless tobacco: Never Used  Substance and Sexual Activity  . Alcohol use: No    Alcohol/week: 0.0 standard drinks  . Drug use: No  . Sexual activity: Not on file  Other Topics Concern  . Not on file  Social History Narrative  . Not on file   Social Determinants of Health   Financial Resource Strain:   . Difficulty  of Paying Living Expenses:   Food Insecurity:   . Worried About Charity fundraiser in the Last Year:   . Arboriculturist in the Last Year:   Transportation Needs:   . Film/video editor (Medical):   Marland Kitchen Lack of Transportation (Non-Medical):   Physical Activity:   . Days of Exercise per Week:   . Minutes of Exercise per Session:   Stress:   . Feeling of Stress :   Social Connections:   . Frequency of Communication with Friends and Family:   . Frequency of Social Gatherings with Friends and Family:   . Attends Religious Services:   . Active Member of Clubs or Organizations:   . Attends Archivist Meetings:   Marland Kitchen Marital Status:   Intimate Partner Violence:   . Fear of Current or Ex-Partner:   . Emotionally Abused:   Marland Kitchen Physically Abused:   . Sexually Abused:    Review of Systems Noticed a bit of  "jittery stomach" No new relationships--last sex probably ~2012 No dysuria No fever--not sick    Objective:   Physical Exam  Constitutional: He appears well-developed. No distress.  Genitourinary:    Genitourinary Comments: Mild dilation in the dorsal vein of the penis No other lesions, warts etc            Assessment & Plan:

## 2019-11-21 ENCOUNTER — Other Ambulatory Visit: Payer: Self-pay | Admitting: Internal Medicine

## 2019-11-21 DIAGNOSIS — I10 Essential (primary) hypertension: Secondary | ICD-10-CM

## 2019-11-22 DIAGNOSIS — I868 Varicose veins of other specified sites: Secondary | ICD-10-CM | POA: Diagnosis not present

## 2019-11-22 DIAGNOSIS — Z125 Encounter for screening for malignant neoplasm of prostate: Secondary | ICD-10-CM | POA: Diagnosis not present

## 2019-12-21 ENCOUNTER — Other Ambulatory Visit: Payer: Self-pay | Admitting: Internal Medicine

## 2019-12-21 DIAGNOSIS — I10 Essential (primary) hypertension: Secondary | ICD-10-CM

## 2019-12-22 MED ORDER — AMLODIPINE BESYLATE 5 MG PO TABS: 5.0000 mg | ORAL_TABLET | Freq: Every day | ORAL | 0 refills | Status: DC

## 2019-12-27 ENCOUNTER — Encounter: Payer: Self-pay | Admitting: Internal Medicine

## 2019-12-28 ENCOUNTER — Encounter: Payer: Self-pay | Admitting: Internal Medicine

## 2019-12-29 ENCOUNTER — Other Ambulatory Visit: Payer: Self-pay

## 2019-12-29 ENCOUNTER — Encounter: Payer: Self-pay | Admitting: Family Medicine

## 2019-12-29 ENCOUNTER — Ambulatory Visit (INDEPENDENT_AMBULATORY_CARE_PROVIDER_SITE_OTHER): Payer: Medicare Other | Admitting: Family Medicine

## 2019-12-29 VITALS — BP 124/82 | HR 63 | Temp 97.7°F | Wt 172.8 lb

## 2019-12-29 DIAGNOSIS — L237 Allergic contact dermatitis due to plants, except food: Secondary | ICD-10-CM | POA: Diagnosis not present

## 2019-12-29 MED ORDER — TRIAMCINOLONE ACETONIDE 0.1 % EX CREA: 1.0000 "application " | TOPICAL_CREAM | Freq: Two times a day (BID) | CUTANEOUS | 0 refills | Status: DC

## 2019-12-29 NOTE — Patient Instructions (Addendum)
Dermitis - allergic reaction  - Use Calamine lotion as needed  - Use Triamcinolone cream on your skin of your body  - Use either just calamine lotion or over the counter hydrocortisone cream on your face   Call or MyChart if not improved on Monday

## 2019-12-29 NOTE — Progress Notes (Signed)
   Subjective:     Brian Matthews is a 69 y.o. male presenting for Rash (neck, right area of chest x 5 days )     HPI  #Rash - started 4-5 days ago - welts on the back of the neck - then spread to the chest - was puffy and thick - initially thought he may have gotten into poison ivy - but checked some photos and they didn't match - was cutting grass - not sure if fluid pockets - mild itch, no numbness or tingling - no pain  Treatment: calamine lotion - less itchy today   Overall improving in some areas and the same in other areas  Review of Systems   Social History   Tobacco Use  Smoking Status Never Smoker  Smokeless Tobacco Never Used        Objective:    BP Readings from Last 3 Encounters:  12/29/19 124/82  10/21/19 128/84  08/18/19 128/82   Wt Readings from Last 3 Encounters:  12/29/19 172 lb 12 oz (78.4 kg)  10/21/19 167 lb (75.8 kg)  08/18/19 169 lb (76.7 kg)    BP 124/82   Pulse 63   Temp 97.7 F (36.5 C) (Temporal)   Wt 172 lb 12 oz (78.4 kg)   SpO2 98%   BMI 23.11 kg/m    Physical Exam Constitutional:      Appearance: Normal appearance. He is not ill-appearing or diaphoretic.  HENT:     Right Ear: External ear normal.     Left Ear: External ear normal.  Eyes:     General: No scleral icterus.    Extraocular Movements: Extraocular movements intact.     Conjunctiva/sclera: Conjunctivae normal.  Cardiovascular:     Rate and Rhythm: Normal rate.  Pulmonary:     Effort: Pulmonary effort is normal.  Musculoskeletal:     Cervical back: Neck supple.  Skin:    General: Skin is warm and dry.     Comments: Erythematous raised plaques in streaking pattern along the Right neck, right shoulder, right chest down to the nipple. Also with a small plaque on the left side just above the hip. A few scattered red papules on the right forearm. Also some erythematous papules on the forehead and nose.   Neurological:     Mental Status: He is alert.  Mental status is at baseline.  Psychiatric:        Mood and Affect: Mood normal.           Assessment & Plan:   Problem List Items Addressed This Visit    None    Visit Diagnoses    Allergic contact dermatitis due to plants, except food    -  Primary   Relevant Medications   triamcinolone cream (KENALOG) 0.1 %     Suspect poison ivy/oak exposure with symptoms starting after mowing the yard. Continue calamine lotion and use topical steroid on body and hydrocortisone cream on face areas. If no improvement may consider oral steroids but discussed avoiding if possible.    Return if symptoms worsen or fail to improve.  Lesleigh Noe, MD  This visit occurred during the SARS-CoV-2 public health emergency.  Safety protocols were in place, including screening questions prior to the visit, additional usage of staff PPE, and extensive cleaning of exam room while observing appropriate contact time as indicated for disinfecting solutions.

## 2020-01-09 ENCOUNTER — Encounter: Payer: Self-pay | Admitting: Internal Medicine

## 2020-02-07 ENCOUNTER — Encounter: Payer: Self-pay | Admitting: Internal Medicine

## 2020-02-08 NOTE — Telephone Encounter (Signed)
Called and relayed message to patient. He verbalized understanding

## 2020-02-08 NOTE — Telephone Encounter (Signed)
I have not 30 min tomorrow, am off Friday. He can see another provider if he wants, otherwise fine to wait until Monday. Give urgent care precautions in case things worsen over the weekend.

## 2020-02-08 NOTE — Telephone Encounter (Signed)
Scheduled 30 min appointment 8/30 Is ok for pt to wait till monday

## 2020-02-09 ENCOUNTER — Encounter: Payer: Self-pay | Admitting: Internal Medicine

## 2020-02-13 ENCOUNTER — Other Ambulatory Visit: Payer: Self-pay

## 2020-02-13 ENCOUNTER — Encounter: Payer: Self-pay | Admitting: Internal Medicine

## 2020-02-13 ENCOUNTER — Ambulatory Visit (INDEPENDENT_AMBULATORY_CARE_PROVIDER_SITE_OTHER): Payer: Medicare Other | Admitting: Internal Medicine

## 2020-02-13 VITALS — BP 124/82 | HR 64 | Temp 97.3°F | Wt 176.0 lb

## 2020-02-13 DIAGNOSIS — M545 Low back pain, unspecified: Secondary | ICD-10-CM

## 2020-02-13 DIAGNOSIS — M25551 Pain in right hip: Secondary | ICD-10-CM | POA: Diagnosis not present

## 2020-02-13 MED ORDER — CYCLOBENZAPRINE HCL 10 MG PO TABS: 5.0000 mg | ORAL_TABLET | Freq: Every evening | ORAL | 0 refills | Status: DC | PRN

## 2020-02-13 NOTE — Patient Instructions (Signed)

## 2020-02-13 NOTE — Progress Notes (Signed)
Subjective:    Patient ID: Brian Matthews, male    DOB: 03/27/1951, 69 y.o.   MRN: [id number]  HPI  Pt presents to the clinic today with c/o low back pain. This started 1 week ago. He describes the pain as achy and stabbing. The pain radiates into his right hip. He denies new numbness, tingling or weakness of the RLE. He denies any injury to the area. He has tried NSAID's OTC with some relief.  Xray lumbar spine from 08/2019 reviewed:   IMPRESSION: Multilevel degenerative change, most prominent L4-L5 and L5-S1. Noacute abnormality identified.  He reports pain has actually improved today. He denies loss of bowel or bladder control. He has taken Ibuprofen OTC with some relief.  Review of Systems  Past Medical History:  Diagnosis Date  . Allergy   . Childhood asthma   . History of blood transfusion   . Hx of adenomatous polyp of colon 04/29/2017  . Hypertension   . Hyperthyroidism   . Positive TB test     Current Outpatient Medications  Medication Sig Dispense Refill  . amLODipine (NORVASC) 5 MG tablet Take 1 tablet (5 mg total) by mouth daily. 90 tablet 0  . Cholecalciferol (VITAMIN D-3 PO) Take 1 capsule by mouth 2 (two) times a week.     . Cyanocobalamin (VITAMIN B-12 PO) Take 1 tablet by mouth 2 (two) times a week.     . Misc Natural Products (GLUCOS-CHONDROIT-MSM COMPLEX) TABS Take 3 tablets by mouth daily.    . Multiple Vitamin (MULTIVITAMIN) tablet Take 1 tablet by mouth daily.    . Omega-3 Fatty Acids (FISH OIL) 1200 MG CAPS Take 1 capsule by mouth 3 (three) times daily.     Marland Kitchen OVER THE COUNTER MEDICATION Prostate Blend 1000 mg 3  tablets  daily.    . Saw Palmetto 450 MG CAPS Take 6 capsules by mouth daily.     Marland Kitchen triamcinolone cream (KENALOG) 0.1 % Apply 1 application topically 2 (two) times daily. 30 g 0  . triamterene-hydrochlorothiazide (MAXZIDE-25) 37.5-25 MG tablet TAKE 1 TABLET BY MOUTH DAILY 90 tablet 0   No current facility-administered medications for this  visit.    Allergies  Allergen Reactions  . Lisinopril Other (See Comments)    cough  . Penicillins Other (See Comments)    Does not remember    Family History  Problem Relation Age of Onset  . Hypertension Mother   . Arthritis Father   . Colon cancer Sister   . Thyroid disease Neg Hx   . Esophageal cancer Neg Hx   . Pancreatic cancer Neg Hx   . Rectal cancer Neg Hx   . Stomach cancer Neg Hx     Social History   Socioeconomic History  . Marital status: Divorced    Spouse name: Not on file  . Number of children: Not on file  . Years of education: Not on file  . Highest education level: Not on file  Occupational History  . Not on file  Tobacco Use  . Smoking status: Never Smoker  . Smokeless tobacco: Never Used  Vaping Use  . Vaping Use: Never used  Substance and Sexual Activity  . Alcohol use: No    Alcohol/week: 0.0 standard drinks  . Drug use: No  . Sexual activity: Not on file  Other Topics Concern  . Not on file  Social History Narrative  . Not on file   Social Determinants of Health   Financial Resource Strain:   .  Difficulty of Paying Living Expenses: Not on file  Food Insecurity:   . Worried About Charity fundraiser in the Last Year: Not on file  . Ran Out of Food in the Last Year: Not on file  Transportation Needs:   . Lack of Transportation (Medical): Not on file  . Lack of Transportation (Non-Medical): Not on file  Physical Activity:   . Days of Exercise per Week: Not on file  . Minutes of Exercise per Session: Not on file  Stress:   . Feeling of Stress : Not on file  Social Connections:   . Frequency of Communication with Friends and Family: Not on file  . Frequency of Social Gatherings with Friends and Family: Not on file  . Attends Religious Services: Not on file  . Active Member of Clubs or Organizations: Not on file  . Attends Archivist Meetings: Not on file  . Marital Status: Not on file  Intimate Partner Violence:   .  Fear of Current or Ex-Partner: Not on file  . Emotionally Abused: Not on file  . Physically Abused: Not on file  . Sexually Abused: Not on file     Constitutional: Pt reports weight gain. Denies fever, malaise, fatigue, headache.  Respiratory: Denies difficulty breathing, shortness of breath, cough or sputum production.   Cardiovascular: Denies chest pain, chest tightness, palpitations or swelling in the hands or feet.  Gastrointestinal: Denies abdominal pain, bloating, constipation, diarrhea or blood in the stool.  GU: Denies urgency, frequency, pain with urination, burning sensation, blood in urine, odor or discharge. Musculoskeletal: Pt reports right lower back pain, right hip pain. Denies decrease in range of motion, difficulty with gait, or joint  swelling.  Skin: Denies redness, rashes, lesions or ulcercations.  Neurological: Denies numbness, tingling, weakness or problems with balance and coordination.    No other specific complaints in a complete review of systems (except as listed in HPI above).  Objective:   Physical Exam BP 124/82   Pulse 64   Temp (!) 97.3 F (36.3 C) (Temporal)   Wt 176 lb (79.8 kg)   SpO2 98%   BMI 23.54 kg/m   Wt Readings from Last 3 Encounters:  12/29/19 172 lb 12 oz (78.4 kg)  10/21/19 167 lb (75.8 kg)  08/18/19 169 lb (76.7 kg)    General: Appears hiz stated age, well developed, well nourished in NAD. Cardiovascular: Normal rate and rhythm.  Pulmonary/Chest: Normal effort and positive vesicular breath sounds. No respiratory distress. No wheezes, rales or ronchi noted.  Musculoskeletal: Normal flexion, extension and rotation of the spine. No bony tenderness noted over the spine. Normal internal and external rotation of the right hip. Difficulty going from a sitting to a standing position. Strength 5/5 BLE. No difficulty with gait.  Neurological: Alert and oriented. Cranial nerves II-XII grossly intact. Coordination normal.  Psychiatric: Mood  and affect normal. Behavior is normal. Judgment and thought content normal.    BMET    Component Value Date/Time   NA 138 03/23/2019 1832   K 3.7 03/23/2019 1832   CL 100 03/23/2019 1832   CO2 26 03/23/2019 1832   GLUCOSE 99 03/23/2019 1832   BUN 14 03/23/2019 1832   CREATININE 1.06 03/23/2019 1832   CREATININE 1.18 02/22/2016 1638   CALCIUM 9.3 03/23/2019 1832   GFRNONAA >60 03/23/2019 1832   GFRAA >60 03/23/2019 1832    Lipid Panel     Component Value Date/Time   CHOL 175 11/10/2018 1620  TRIG 69.0 11/10/2018 1620   HDL 38.50 (L) 11/10/2018 1620   CHOLHDL 5 11/10/2018 1620   VLDL 13.8 11/10/2018 1620   LDLCALC 122 (H) 11/10/2018 1620    CBC    Component Value Date/Time   WBC 4.8 03/23/2019 1832   RBC 5.31 03/23/2019 1832   HGB 16.8 03/23/2019 1832   HCT 47.3 03/23/2019 1832   PLT 185 03/23/2019 1832   MCV 89.1 03/23/2019 1832   MCH 31.6 03/23/2019 1832   MCHC 35.5 03/23/2019 1832   RDW 12.4 03/23/2019 1832    Hgb A1C Lab Results  Component Value Date   HGBA1C 5.3 02/22/2016           Assessment & Plan:   Acute Right Sided Low Back Pain, Right Hip:  Some improvement Back exercises given RX for Flexeril 5-10 mg QHS prn- sedation caution given No indication for additional xray or referral to PT at this time  Return precautions discussed  Webb Silversmith, NP This visit occurred during the SARS-CoV-2 public health emergency.  Safety protocols were in place, including screening questions prior to the visit, additional usage of staff PPE, and extensive cleaning of exam room while observing appropriate contact time as indicated for disinfecting solutions.

## 2020-02-14 ENCOUNTER — Encounter: Payer: Self-pay | Admitting: Internal Medicine

## 2020-02-23 ENCOUNTER — Other Ambulatory Visit: Payer: Self-pay | Admitting: Internal Medicine

## 2020-02-23 DIAGNOSIS — I10 Essential (primary) hypertension: Secondary | ICD-10-CM

## 2020-02-23 MED ORDER — TRIAMTERENE-HCTZ 37.5-25 MG PO TABS: 1.0000 | ORAL_TABLET | Freq: Every day | ORAL | 0 refills | Status: DC

## 2020-02-23 NOTE — Telephone Encounter (Signed)
E-scribed refill.  Plz schedule wellness, cpe and lab visits.  

## 2020-02-24 ENCOUNTER — Encounter: Payer: Self-pay | Admitting: Internal Medicine

## 2020-02-24 ENCOUNTER — Telehealth: Payer: Self-pay | Admitting: Internal Medicine

## 2020-02-24 NOTE — Telephone Encounter (Signed)
Patient sent message via mychart to get physical with [facility] ASAP. Patient then listed following concerns:  "Ab and back issues. Constant ab discomfort in lower left and lower right ab area, sometimes in upper right and upper left ab area. Having somewhat frequent light-colored BMs, sometimes up to 3 a day. Is this start of a pancreas issue? Back issue also continues, mostly right side and hip but occasionally on the left side. Back issue not as severe as it was before. Normal exercising seems more tiring than usual."  Please triage in regards to abdominal discomfort.

## 2020-02-24 NOTE — Telephone Encounter (Signed)
I spoke with pt and he said that he had already discussed the abd and back issues with Avie Echevaria NP at 02/13/20 appt and pt said that he can wait to talk with Avie Echevaria NP about these symptoms at a physical appt. Alice will schedule CPX and UC & ED precautions given and pt voiced understanding. FYI to Avie Echevaria NP and [facility] CMA. Sending note to Dr Glori Bickers who is in office.

## 2020-03-19 ENCOUNTER — Other Ambulatory Visit: Payer: Self-pay | Admitting: Internal Medicine

## 2020-03-19 DIAGNOSIS — I10 Essential (primary) hypertension: Secondary | ICD-10-CM

## 2020-03-24 ENCOUNTER — Other Ambulatory Visit: Payer: Self-pay

## 2020-03-24 ENCOUNTER — Ambulatory Visit (INDEPENDENT_AMBULATORY_CARE_PROVIDER_SITE_OTHER): Payer: Medicare Other

## 2020-03-24 DIAGNOSIS — Z23 Encounter for immunization: Secondary | ICD-10-CM | POA: Diagnosis not present

## 2020-04-20 ENCOUNTER — Encounter: Payer: Self-pay | Admitting: Internal Medicine

## 2020-04-20 ENCOUNTER — Ambulatory Visit (INDEPENDENT_AMBULATORY_CARE_PROVIDER_SITE_OTHER): Payer: Medicare Other | Admitting: Internal Medicine

## 2020-04-20 ENCOUNTER — Other Ambulatory Visit: Payer: Self-pay

## 2020-04-20 VITALS — BP 132/80 | HR 72 | Temp 98.3°F | Ht 71.25 in | Wt 173.5 lb

## 2020-04-20 DIAGNOSIS — E061 Subacute thyroiditis: Secondary | ICD-10-CM | POA: Diagnosis not present

## 2020-04-20 DIAGNOSIS — Z0000 Encounter for general adult medical examination without abnormal findings: Secondary | ICD-10-CM | POA: Diagnosis not present

## 2020-04-20 DIAGNOSIS — K219 Gastro-esophageal reflux disease without esophagitis: Secondary | ICD-10-CM | POA: Diagnosis not present

## 2020-04-20 DIAGNOSIS — Z125 Encounter for screening for malignant neoplasm of prostate: Secondary | ICD-10-CM | POA: Diagnosis not present

## 2020-04-20 DIAGNOSIS — I10 Essential (primary) hypertension: Secondary | ICD-10-CM | POA: Diagnosis not present

## 2020-04-20 NOTE — Assessment & Plan Note (Signed)
TSH today 

## 2020-04-20 NOTE — Patient Instructions (Signed)
Health Maintenance After Age 68 After age 68, you are at a higher risk for certain long-term diseases and infections as well as injuries from falls. Falls are a major cause of broken bones and head injuries in people who are older than age 68. Getting regular preventive care can help to keep you healthy and well. Preventive care includes getting regular testing and making lifestyle changes as recommended by your health care provider. Talk with your health care provider about:  Which screenings and tests you should have. A screening is a test that checks for a disease when you have no symptoms.  A diet and exercise plan that is right for you. What should I know about screenings and tests to prevent falls? Screening and testing are the best ways to find a health problem early. Early diagnosis and treatment give you the best chance of managing medical conditions that are common after age 68. Certain conditions and lifestyle choices may make you more likely to have a fall. Your health care provider may recommend:  Regular vision checks. Poor vision and conditions such as cataracts can make you more likely to have a fall. If you wear glasses, make sure to get your prescription updated if your vision changes.  Medicine review. Work with your health care provider to regularly review all of the medicines you are taking, including over-the-counter medicines. Ask your health care provider about any side effects that may make you more likely to have a fall. Tell your health care provider if any medicines that you take make you feel dizzy or sleepy.  Osteoporosis screening. Osteoporosis is a condition that causes the bones to get weaker. This can make the bones weak and cause them to break more easily.  Blood pressure screening. Blood pressure changes and medicines to control blood pressure can make you feel dizzy.  Strength and balance checks. Your health care provider may recommend certain tests to check your  strength and balance while standing, walking, or changing positions.  Foot health exam. Foot pain and numbness, as well as not wearing proper footwear, can make you more likely to have a fall.  Depression screening. You may be more likely to have a fall if you have a fear of falling, feel emotionally low, or feel unable to do activities that you used to do.  Alcohol use screening. Using too much alcohol can affect your balance and may make you more likely to have a fall. What actions can I take to lower my risk of falls? General instructions  Talk with your health care provider about your risks for falling. Tell your health care provider if: ? You fall. Be sure to tell your health care provider about all falls, even ones that seem minor. ? You feel dizzy, sleepy, or off-balance.  Take over-the-counter and prescription medicines only as told by your health care provider. These include any supplements.  Eat a healthy diet and maintain a healthy weight. A healthy diet includes low-fat dairy products, low-fat (lean) meats, and fiber from whole grains, beans, and lots of fruits and vegetables. Home safety  Remove any tripping hazards, such as rugs, cords, and clutter.  Install safety equipment such as grab bars in bathrooms and safety rails on stairs.  Keep rooms and walkways well-lit. Activity   Follow a regular exercise program to stay fit. This will help you maintain your balance. Ask your health care provider what types of exercise are appropriate for you.  If you need a cane or   walker, use it as recommended by your health care provider.  Wear supportive shoes that have nonskid soles. Lifestyle  Do not drink alcohol if your health care provider tells you not to drink.  If you drink alcohol, limit how much you have: ? 0-1 drink a day for women. ? 0-2 drinks a day for men.  Be aware of how much alcohol is in your drink. In the [place] one drink equals one typical bottle of beer (12  oz), one-half glass of wine (5 oz), or one shot of hard liquor (1 oz).  Do not use any products that contain nicotine or tobacco, such as cigarettes and e-cigarettes. If you need help quitting, ask your health care provider. Summary  Having a healthy lifestyle and getting preventive care can help to protect your health and wellness after age 68.  Screening and testing are the best way to find a health problem early and help you avoid having a fall. Early diagnosis and treatment give you the best chance for managing medical conditions that are more common for people who are older than age 68.  Falls are a major cause of broken bones and head injuries in people who are older than age 68. Take precautions to prevent a fall at home.  Work with your health care provider to learn what changes you can make to improve your health and wellness and to prevent falls. This information is not intended to replace advice given to you by your health care provider. Make sure you discuss any questions you have with your health care provider. Document Revised: 09/23/2018 Document Reviewed: 04/15/2017 Elsevier Patient Education  2020 Elsevier Inc.  

## 2020-04-20 NOTE — Assessment & Plan Note (Signed)
Currently not an issue Will monitor 

## 2020-04-20 NOTE — Assessment & Plan Note (Signed)
He is having nocturia, encouraged him to take Triamterene HTC earlier in the day Continue Amlodipine CMET today Will monitor

## 2020-04-20 NOTE — Progress Notes (Signed)
HPI:  Pt presents to the clinic today for his subsequent annual Medicare Wellness Exam. He is also due to follow up chronic conditions.  HTN: His BP today is 132/80. He is taking Amlodipine and Triamterene HCT as prescribed. ECG from 09/2017 reviewed.  History of Subacute Thyroiditis: Currently not an issue. He does not follow with endocrinology.   GERD: Intermittent. He has not needed to use Famotidine at all. There is no upper GI on file.   Past Medical History:  Diagnosis Date  . Allergy   . Childhood asthma   . History of blood transfusion   . Hx of adenomatous polyp of colon 04/29/2017  . Hypertension   . Hyperthyroidism   . Positive TB test     Current Outpatient Medications  Medication Sig Dispense Refill  . amLODipine (NORVASC) 5 MG tablet TAKE 1 TABLET(5 MG) BY MOUTH DAILY 90 tablet 0  . Cholecalciferol (VITAMIN D-3 PO) Take 1 capsule by mouth 2 (two) times a week.     . Cyanocobalamin (VITAMIN B-12 PO) Take 1 tablet by mouth 2 (two) times a week.     . Misc Natural Products (GLUCOS-CHONDROIT-MSM COMPLEX) TABS Take 3 tablets by mouth daily.    . Multiple Vitamin (MULTIVITAMIN) tablet Take 1 tablet by mouth daily.    . Omega-3 Fatty Acids (FISH OIL) 1200 MG CAPS Take 1 capsule by mouth 3 (three) times daily.     Marland Kitchen OVER THE COUNTER MEDICATION Prostate Blend 1000 mg 3  tablets  daily.    . Saw Palmetto 450 MG CAPS Take 6 capsules by mouth daily.     Marland Kitchen triamterene-hydrochlorothiazide (MAXZIDE-25) 37.5-25 MG tablet Take 1 tablet by mouth daily. 90 tablet 0   No current facility-administered medications for this visit.    Allergies  Allergen Reactions  . Lisinopril Other (See Comments)    cough  . Penicillins Other (See Comments)    Does not remember    Family History  Problem Relation Age of Onset  . Hypertension Mother   . Arthritis Father   . Colon cancer Sister   . Thyroid disease Neg Hx   . Esophageal cancer Neg Hx   . Pancreatic cancer Neg Hx   . Rectal  cancer Neg Hx   . Stomach cancer Neg Hx     Social History   Socioeconomic History  . Marital status: Divorced    Spouse name: Not on file  . Number of children: Not on file  . Years of education: Not on file  . Highest education level: Not on file  Occupational History  . Not on file  Tobacco Use  . Smoking status: Never Smoker  . Smokeless tobacco: Never Used  Vaping Use  . Vaping Use: Never used  Substance and Sexual Activity  . Alcohol use: No    Alcohol/week: 0.0 standard drinks  . Drug use: No  . Sexual activity: Not on file  Other Topics Concern  . Not on file  Social History Narrative  . Not on file   Social Determinants of Health   Financial Resource Strain:   . Difficulty of Paying Living Expenses: Not on file  Food Insecurity:   . Worried About Charity fundraiser in the Last Year: Not on file  . Ran Out of Food in the Last Year: Not on file  Transportation Needs:   . Lack of Transportation (Medical): Not on file  . Lack of Transportation (Non-Medical): Not on file  Physical Activity:   .  Days of Exercise per Week: Not on file  . Minutes of Exercise per Session: Not on file  Stress:   . Feeling of Stress : Not on file  Social Connections:   . Frequency of Communication with Friends and Family: Not on file  . Frequency of Social Gatherings with Friends and Family: Not on file  . Attends Religious Services: Not on file  . Active Member of Clubs or Organizations: Not on file  . Attends Archivist Meetings: Not on file  . Marital Status: Not on file  Intimate Partner Violence:   . Fear of Current or Ex-Partner: Not on file  . Emotionally Abused: Not on file  . Physically Abused: Not on file  . Sexually Abused: Not on file    Hospitiliaztions: None  Health Maintenance:    Flu: 03/2020  Tetanus: 11/2008  Pneumovax: 10/2018  Prevnar: 09/2017  Zostavax: 09/2015  Shingrix: never  Covid: Moderna  PSA: 10/2018  Colon Screening: 04/2017  Eye  Doctor: annually   Dental Exam: biannually   Providers:   PCP: Webb Silversmith, NP-C  Urology: [facility]    I have personally reviewed and have noted:  1. The patient's medical and social history 2. Their use of alcohol, tobacco or illicit drugs 3. Their current medications and supplements 4. The patient's functional ability including ADL's, fall risks, home safety risks and  hearing or visual impairment. 5. Diet and physical activities 6. Evidence for depression or mood disorder  Subjective:   Review of Systems:   Constitutional: Denies fever, malaise, fatigue, headache or abrupt weight changes.  HEENT: Pt reports hearing loss in right ear.Denies eye pain, eye redness, ear pain, ringing in the ears, wax buildup, runny nose, nasal congestion, bloody nose, or sore throat. Respiratory: Denies difficulty breathing, shortness of breath, cough or sputum production.   Cardiovascular: Denies chest pain, chest tightness, palpitations or swelling in the hands or feet.  Gastrointestinal: Pt reports intermittent bilateral lower abdominal pain. Denies bloating, constipation, diarrhea or blood in the stool.  GU: Pt reports nocturia. Denies urgency, frequency, pain with urination, burning sensation, blood in urine, odor or discharge. Musculoskeletal: Pt reports intermittent right shoulder pain. Denies decrease in range of motion, difficulty with gait, muscle pain or joint swelling.  Skin: Denies redness, rashes, lesions or ulcercations.  Neurological: Denies dizziness, difficulty with memory, difficulty with speech or problems with balance and coordination.  Psych: Denies anxiety, depression, SI/HI.  No other specific complaints in a complete review of systems (except as listed in HPI above).  Objective:  PE:   BP 132/80   Pulse 72   Temp 98.3 F (36.8 C) (Temporal)   Ht 5' 11.25" (1.81 m)   Wt 173 lb 8 oz (78.7 kg)   SpO2 98%   BMI 24.03 kg/m   Wt Readings from Last 3  Encounters:  02/13/20 176 lb (79.8 kg)  12/29/19 172 lb 12 oz (78.4 kg)  10/21/19 167 lb (75.8 kg)    General: Appears his stated age, well developed, well nourished in NAD. Skin: Warm, dry and intact. No rashes noted. HEENT: Head: normal shape and size; Eyes: sclera white, no icterus, conjunctiva pink, PERRLA and EOMs intact;  Neck: Neck supple, trachea midline. No masses, lumps or thyromegaly present.  Cardiovascular: Normal rate and rhythm. S1,S2 noted.  No murmur, rubs or gallops noted. No JVD or BLE edema. No carotid bruits noted. Pulmonary/Chest: Normal effort and positive vesicular breath sounds. No respiratory distress. No wheezes, rales or  ronchi noted.  Abdomen: Soft and nontender. Normal bowel sounds. No distention or masses noted. Liver, spleen and kidneys non palpable. Musculoskeletal: Strength 5/5 BUE/BLE. No signs of joint swelling.  Neurological: Alert and oriented. Cranial nerves II-XII grossly intact. Coordination normal.  Psychiatric: Mood and affect normal. Behavior is normal. Judgment and thought content normal.     BMET    Component Value Date/Time   NA 138 03/23/2019 1832   K 3.7 03/23/2019 1832   CL 100 03/23/2019 1832   CO2 26 03/23/2019 1832   GLUCOSE 99 03/23/2019 1832   BUN 14 03/23/2019 1832   CREATININE 1.06 03/23/2019 1832   CREATININE 1.18 02/22/2016 1638   CALCIUM 9.3 03/23/2019 1832   GFRNONAA >60 03/23/2019 1832   GFRAA >60 03/23/2019 1832    Lipid Panel     Component Value Date/Time   CHOL 175 11/10/2018 1620   TRIG 69.0 11/10/2018 1620   HDL 38.50 (L) 11/10/2018 1620   CHOLHDL 5 11/10/2018 1620   VLDL 13.8 11/10/2018 1620   LDLCALC 122 (H) 11/10/2018 1620    CBC    Component Value Date/Time   WBC 4.8 03/23/2019 1832   RBC 5.31 03/23/2019 1832   HGB 16.8 03/23/2019 1832   HCT 47.3 03/23/2019 1832   PLT 185 03/23/2019 1832   MCV 89.1 03/23/2019 1832   MCH 31.6 03/23/2019 1832   MCHC 35.5 03/23/2019 1832   RDW 12.4 03/23/2019  1832    Hgb A1C Lab Results  Component Value Date   HGBA1C 5.3 02/22/2016      Assessment and Plan:   Medicare Annual Wellness Visit:  Diet: He rarely eats meat. He consumes fruits and veggies daily. He tries to avoid fried foods. He drinks mostly water, fruit juice, unsweet tea. Physical activity: Light weights, riding his bike. Depression/mood screen: Negative, PHQ 9 score 0. Hearing: Intact to whispered voice Visual acuity: Grossly normal, performs annual eye exam  ADLs: Capable Fall risk: None Home safety: Good Cognitive evaluation: Intact to orientation, naming, recall and repetition EOL planning: Adv directives, full code/ I agree  Preventative Medicine: Flu, pneumovax, prevnar, covid vaccine and zostovax UTD. Advised him if he gets bitten or cut, he will need to get a tetanus vaccine. He could consider Shigrix vaccine, would need to call insurance provider to check coverage, would need to get at pharmacy. Colon screening UTD. Encouraged him to consume a balanced diet and exercise regimen. Advised him to see an eye doctor and dentist annually. Will check CBC, CMET, TSH, Lipid and PSA today. Due dates for screening exams given to patient as part of his AVS.   Next appointment: 1 year, Medicare Wellness Exam   Webb Silversmith, NP This visit occurred during the SARS-CoV-2 public health emergency.  Safety protocols were in place, including screening questions prior to the visit, additional usage of staff PPE, and extensive cleaning of exam room while observing appropriate contact time as indicated for disinfecting solutions.

## 2020-04-21 LAB — CBC
HCT: 47.5 % (ref 38.5–50.0)
Hemoglobin: 16.3 g/dL (ref 13.2–17.1)
MCH: 30 pg (ref 27.0–33.0)
MCHC: 34.3 g/dL (ref 32.0–36.0)
MCV: 87.5 fL (ref 80.0–100.0)
MPV: 11.9 fL (ref 7.5–12.5)
Platelets: 192 10*3/uL (ref 140–400)
RBC: 5.43 10*6/uL (ref 4.20–5.80)
RDW: 13.3 % (ref 11.0–15.0)
WBC: 4.3 10*3/uL (ref 3.8–10.8)

## 2020-04-21 LAB — COMPREHENSIVE METABOLIC PANEL
AG Ratio: 1.8 (calc) (ref 1.0–2.5)
ALT: 16 U/L (ref 9–46)
AST: 22 U/L (ref 10–35)
Albumin: 4.5 g/dL (ref 3.6–5.1)
Alkaline phosphatase (APISO): 56 U/L (ref 35–144)
BUN: 14 mg/dL (ref 7–25)
CO2: 24 mmol/L (ref 20–32)
Calcium: 10 mg/dL (ref 8.6–10.3)
Chloride: 101 mmol/L (ref 98–110)
Creat: 1.13 mg/dL (ref 0.70–1.25)
Globulin: 2.5 g/dL (calc) (ref 1.9–3.7)
Glucose, Bld: 87 mg/dL (ref 65–99)
Potassium: 4.7 mmol/L (ref 3.5–5.3)
Sodium: 141 mmol/L (ref 135–146)
Total Bilirubin: 0.8 mg/dL (ref 0.2–1.2)
Total Protein: 7 g/dL (ref 6.1–8.1)

## 2020-04-21 LAB — TSH: TSH: 2.55 mIU/L (ref 0.40–4.50)

## 2020-04-21 LAB — LIPID PANEL
Cholesterol: 167 mg/dL (ref <200)
HDL: 35 mg/dL — ABNORMAL LOW (ref >=40)
LDL Cholesterol (Calc): 109 mg/dL (calc) — ABNORMAL HIGH
Non-HDL Cholesterol (Calc): 132 mg/dL (calc) — ABNORMAL HIGH (ref <130)
Total CHOL/HDL Ratio: 4.8 (calc) (ref <5.0)
Triglycerides: 115 mg/dL (ref <150)

## 2020-04-23 ENCOUNTER — Encounter: Payer: Self-pay | Admitting: Internal Medicine

## 2020-04-23 DIAGNOSIS — R350 Frequency of micturition: Secondary | ICD-10-CM

## 2020-04-23 LAB — PSA, MEDICARE: PSA: 0.48 ng/ml (ref 0.10–4.00)

## 2020-04-26 DIAGNOSIS — Z23 Encounter for immunization: Secondary | ICD-10-CM | POA: Diagnosis not present

## 2020-04-27 ENCOUNTER — Other Ambulatory Visit: Payer: Self-pay | Admitting: Internal Medicine

## 2020-04-27 DIAGNOSIS — R109 Unspecified abdominal pain: Secondary | ICD-10-CM

## 2020-04-27 NOTE — Progress Notes (Signed)
Brian Matthews

## 2020-05-02 ENCOUNTER — Other Ambulatory Visit: Payer: Self-pay

## 2020-05-02 ENCOUNTER — Other Ambulatory Visit (INDEPENDENT_AMBULATORY_CARE_PROVIDER_SITE_OTHER): Payer: Medicare Other

## 2020-05-02 DIAGNOSIS — R350 Frequency of micturition: Secondary | ICD-10-CM

## 2020-05-02 DIAGNOSIS — R109 Unspecified abdominal pain: Secondary | ICD-10-CM

## 2020-05-02 LAB — POC URINALSYSI DIPSTICK (AUTOMATED)
Bilirubin, UA: NEGATIVE
Blood, UA: NEGATIVE
Glucose, UA: NEGATIVE
Ketones, UA: NEGATIVE
Leukocytes, UA: NEGATIVE
Nitrite, UA: NEGATIVE
Protein, UA: NEGATIVE
Spec Grav, UA: 1.01 (ref 1.010–1.025)
Urobilinogen, UA: 0.2 E.U./dL
pH, UA: 7.5 (ref 5.0–8.0)

## 2020-05-02 LAB — AMYLASE: Amylase: 73 U/L (ref 27–131)

## 2020-05-02 LAB — LIPASE: Lipase: 80 U/L — ABNORMAL HIGH (ref 11.0–59.0)

## 2020-05-02 NOTE — Addendum Note (Signed)
Addended by: Lurlean Nanny on: 05/02/2020 12:21 PM   Modules accepted: Orders

## 2020-05-02 NOTE — Addendum Note (Signed)
Addended by: Lurlean Nanny on: 05/02/2020 12:22 PM   Modules accepted: Orders

## 2020-05-03 ENCOUNTER — Encounter: Payer: Self-pay | Admitting: Internal Medicine

## 2020-05-03 DIAGNOSIS — R748 Abnormal levels of other serum enzymes: Secondary | ICD-10-CM

## 2020-05-03 DIAGNOSIS — R1012 Left upper quadrant pain: Secondary | ICD-10-CM

## 2020-05-09 ENCOUNTER — Telehealth: Payer: Self-pay

## 2020-05-09 NOTE — Telephone Encounter (Signed)
I spoke wth pt; pt said he is not going to UC today; pain level now is 5 - 6;  Burning sensation and like pins sticking pt across upper abd pain is on and off; not constant. Pancreatic enzymes showed increase with lipase with physical labs early Nov.; has US abdomen scheduled for 05/23/20. No covid symptoms; UC & ED precautions given and pt voiced understanding.pt still wants to keep appt with Gentry Fitz NP on 05/11/20 at 10:40. FYI to Gentry Fitz NP and Avie Echevaria NP as PCP.

## 2020-05-09 NOTE — Telephone Encounter (Signed)
Noted, will evaluate. 

## 2020-05-09 NOTE — Telephone Encounter (Signed)
[facility] - Client TELEPHONE ADVICE RECORD [facility] Patient Name: ROLLEN SELDERS Gender: Male DOB: 1951/04/07 Age: 69 Y 53 M 12 D Return Phone Number: [phone] (Primary), [phone] (Secondary) Address[place] [address] Client [facility] - Client Client Site [place] - Day Physician Webb Silversmith - NP Contact Type Call Who Is Calling Patient / Member / Family / Caregiver Call Type Triage / Clinical Relationship To Patient Self Return Phone Number [phone] (Primary) Chief Complaint Abdominal Pain Reason for Call Symptomatic / Request for [place] states that he is having abdominal pain for a while. Caller states that it is moderate pain. He states that it feels like a burning pain. He feels like the pain is a 6/10. [facility] [facility] [place] No Nurse Assessment Nurse: Harlow Mares, RN, Suanne Marker Date/Time Eilene Ghazi Time): 05/09/2020 8:40:06 AM Confirm and document reason for call. If symptomatic, describe symptoms. ---Caller states that he is having abdominal pain for a while. Caller states that it is moderate pain. He states that it feels like a burning pain. He feels like the pain is a 6/10. Does the patient have any new or worsening symptoms? ---Yes Will a triage be completed? ---Yes Related visit to physician within the last 2 weeks? ---No Does the PT have any chronic conditions? (i.e. diabetes, asthma, this includes High risk factors for pregnancy, etc.) ---Yes List chronic conditions. ---elevated pancreatic enzymes; Is this a behavioral health or substance abuse call? ---No Guidelines Guideline Title Affirmed Question Affirmed Notes Nurse Date/Time Eilene Ghazi Time) Abdominal Pain - Upper [1] MILD-MODERATE pain AND [2] constant AND [3] present > 2 hours Harlow Mares, RN, Suanne Marker  05/09/2020 8:43:24 AM Disp. Time Eilene Ghazi Time) Disposition Final User 05/09/2020 8:50:33 AM See HCP within 4 Hours (or PCP triage) Yes Harlow Mares, RN, Suanne Marker PLEASE NOTE: All timestamps contained within this report are represented as [place] Standard Time. CONFIDENTIALTY NOTICE: This fax transmission is intended only for the addressee. It contains information that is legally privileged, confidential or otherwise protected from use or disclosure. If you are not the intended recipient, you are strictly prohibited from reviewing, disclosing, copying using or disseminating any of this information or taking any action in reliance on or regarding this information. If you have received this fax in error, please notify us immediately by telephone so that we can arrange for its return to [name]. Phone: [phone], Toll-Free: [phone], Fax: [phone] Page: 2 of 2 Call Id: [id number] [place] Disagree/Comply Comply Caller Understands Yes PreDisposition Call Doctor Care Advice Given Per Guideline SEE HCP (OR PCP TRIAGE) WITHIN 4 HOURS: * IF OFFICE WILL BE OPEN: You need to be seen within the next 3 or 4 hours. Call your doctor (or NP/PA) now or as soon as the office opens. TAKE AN ANTACID: * If having pain now, try taking an antacid (e.g., Mylanta, Maalox). * Dose: Take 2 tablespoons (30 ml) of liquid by mouth. CALL BACK IF: * You become worse CARE ADVICE given per Abdominal Pain, Upper (Adult) guideline. Referrals GO TO FACILITY OTHER - SPECIFY

## 2020-05-11 ENCOUNTER — Ambulatory Visit: Payer: Medicare Other | Admitting: Primary Care

## 2020-05-14 NOTE — Telephone Encounter (Signed)
[facility] - Client Nonclinical Telephone Record  [facility] Client [place] [facility] - Client Client Site [place] Physician Alma Friendly - NP Contact Type Call Who Is Calling Patient / Member / Family / Caregiver Caller Name Nicki [facility]is [facility]) Donata Clay Phone Number [phone] Patient Name Brian Matthews Patient DOB 15-Jun-1951 Call Type Message Only Information Provided Reason for Call Request to [facility] Appointment Initial Comment Caller would like to cancel his appointment for tomorrow. Additional Comment Appointment 05/11/20 at 1040am. Provided caller with office hours and advised to call back during business hours. Caller declined triage. Disp. Time Disposition Final User 05/10/2020 3:31:07 PM General Information Provided Yes Pense, Lori Call Closed By: Gloris Ham Transaction Date/Time: 05/10/2020 3:27:24 PM (ET)

## 2020-05-14 NOTE — Telephone Encounter (Signed)
[place] [facility] - Client Nonclinical Telephone Record  [facility] Client [facility] - Client Client Site [place] - Day Physician Alma Friendly - NP Contact Type Call Who Is Calling Patient / Member / Family / Caregiver Caller Name Elion Hocker Caller Phone Number [phone] Patient Name Brian Matthews Patient DOB [birth year] Call Type Message Only Information Provided Reason for Call Request for General Office Information Initial Comment Caller states that he has a schedule appt today that he canceled yesterday and wants to make sure his appt was canceled. Disp. Time Disposition Final User 05/11/2020 8:31:15 AM General Information Provided Yes Lawrence Santiago Call Closed By: Lawrence Santiago Transaction Date/Time: 05/11/2020 8:29:02 AM (ET)

## 2020-05-14 NOTE — Telephone Encounter (Signed)
Apt was cancelled on Friday.

## 2020-05-21 ENCOUNTER — Other Ambulatory Visit: Payer: Self-pay | Admitting: Internal Medicine

## 2020-05-21 DIAGNOSIS — I10 Essential (primary) hypertension: Secondary | ICD-10-CM

## 2020-05-23 ENCOUNTER — Ambulatory Visit
Admission: RE | Admit: 2020-05-23 | Discharge: 2020-05-23 | Disposition: A | Discharge status: Home / Self Care | Payer: Medicare Other | Source: Ambulatory Visit | Attending: Internal Medicine | Admitting: Internal Medicine

## 2020-05-23 DIAGNOSIS — R1012 Left upper quadrant pain: Secondary | ICD-10-CM

## 2020-05-23 DIAGNOSIS — N281 Cyst of kidney, acquired: Secondary | ICD-10-CM | POA: Diagnosis not present

## 2020-05-23 DIAGNOSIS — R160 Hepatomegaly, not elsewhere classified: Secondary | ICD-10-CM | POA: Diagnosis not present

## 2020-05-23 DIAGNOSIS — K7689 Other specified diseases of liver: Secondary | ICD-10-CM | POA: Diagnosis not present

## 2020-05-23 DIAGNOSIS — R748 Abnormal levels of other serum enzymes: Secondary | ICD-10-CM

## 2020-05-25 ENCOUNTER — Encounter: Payer: Self-pay | Admitting: Internal Medicine

## 2020-05-25 DIAGNOSIS — R748 Abnormal levels of other serum enzymes: Secondary | ICD-10-CM

## 2020-05-30 NOTE — Addendum Note (Signed)
Addended by: Lurlean Nanny on: 05/30/2020 09:10 AM   Modules accepted: Orders

## 2020-05-30 NOTE — Telephone Encounter (Signed)
Yes, just lipase

## 2020-05-31 ENCOUNTER — Other Ambulatory Visit (INDEPENDENT_AMBULATORY_CARE_PROVIDER_SITE_OTHER): Payer: Medicare Other

## 2020-05-31 ENCOUNTER — Other Ambulatory Visit: Payer: Self-pay

## 2020-05-31 DIAGNOSIS — R748 Abnormal levels of other serum enzymes: Secondary | ICD-10-CM | POA: Diagnosis not present

## 2020-05-31 LAB — LIPASE: Lipase: 42 U/L (ref 11.0–59.0)

## 2020-06-01 ENCOUNTER — Other Ambulatory Visit: Payer: Medicare Other

## 2020-06-20 ENCOUNTER — Other Ambulatory Visit: Payer: Self-pay | Admitting: Internal Medicine

## 2020-06-20 DIAGNOSIS — I10 Essential (primary) hypertension: Secondary | ICD-10-CM

## 2020-08-14 ENCOUNTER — Other Ambulatory Visit: Payer: Self-pay | Admitting: Internal Medicine

## 2020-08-14 DIAGNOSIS — I10 Essential (primary) hypertension: Secondary | ICD-10-CM

## 2020-11-12 ENCOUNTER — Other Ambulatory Visit: Payer: Self-pay | Admitting: Internal Medicine

## 2020-11-12 DIAGNOSIS — I10 Essential (primary) hypertension: Secondary | ICD-10-CM

## 2020-11-13 ENCOUNTER — Telehealth: Payer: Self-pay | Admitting: Internal Medicine

## 2020-11-13 NOTE — Telephone Encounter (Signed)
  LAST APPOINTMENT DATE: 11/12/2020   NEXT APPOINTMENT DATE:@Visit  date not found  MEDICATION:triamterene-hydrochlorothiazide (MAXZIDE-25) 37.5-25 MG tablet  PHARMACY:[id number] - [place],   Let patient know to contact pharmacy at the end of the day to make sure medication is ready.  Please notify patient to allow 48-72 hours to process  Encourage patient to contact the pharmacy for refills or they can request refills through [place]:   LAST REFILL:  QTY:  REFILL DATE:    OTHER COMMENTS:    Okay for refill?  Please advise

## 2020-11-14 NOTE — Telephone Encounter (Signed)
Refill Maxzide Last refill 08/14/20 #90 Last office visit 04/20/20 No upcoming appointment scheduled

## 2020-12-10 ENCOUNTER — Telehealth: Payer: Self-pay

## 2020-12-10 NOTE — Telephone Encounter (Signed)
Pt states that he has been experiencing numbness in his toes and fingers over the last few weeks, as well as frequent urination and dry mouth. Pt is concerned that these could be signs of diabetes and would like to have blood work done.  Pt is waiting for a new provider to establish with. He is scheduled with Dr. Glori Bickers this Wednesday at 11:30.

## 2020-12-12 ENCOUNTER — Encounter: Payer: Self-pay | Admitting: Family Medicine

## 2020-12-12 ENCOUNTER — Other Ambulatory Visit: Payer: Self-pay

## 2020-12-12 ENCOUNTER — Ambulatory Visit (INDEPENDENT_AMBULATORY_CARE_PROVIDER_SITE_OTHER): Payer: Medicare Other | Admitting: Family Medicine

## 2020-12-12 VITALS — BP 122/80 | HR 70 | Temp 97.1°F | Ht 71.25 in | Wt 178.2 lb

## 2020-12-12 DIAGNOSIS — R7309 Other abnormal glucose: Secondary | ICD-10-CM

## 2020-12-12 DIAGNOSIS — R200 Anesthesia of skin: Secondary | ICD-10-CM | POA: Diagnosis not present

## 2020-12-12 DIAGNOSIS — R350 Frequency of micturition: Secondary | ICD-10-CM | POA: Diagnosis not present

## 2020-12-12 DIAGNOSIS — I10 Essential (primary) hypertension: Secondary | ICD-10-CM | POA: Diagnosis not present

## 2020-12-12 LAB — POC URINALSYSI DIPSTICK (AUTOMATED)
Bilirubin, UA: NEGATIVE
Blood, UA: NEGATIVE
Glucose, UA: NEGATIVE
Ketones, UA: NEGATIVE
Leukocytes, UA: NEGATIVE
Nitrite, UA: NEGATIVE
Protein, UA: NEGATIVE
Spec Grav, UA: 1.015 (ref 1.010–1.025)
Urobilinogen, UA: 0.2 E.U./dL
pH, UA: 7 (ref 5.0–8.0)

## 2020-12-12 LAB — POCT GLYCOSYLATED HEMOGLOBIN (HGB A1C): Hemoglobin A1C: 5.4 % (ref 4.0–5.6)

## 2020-12-12 NOTE — Assessment & Plan Note (Signed)
Most likely from triam-hct  Nl a1c  Has had nl psa and urology f/u  ua pending

## 2020-12-12 NOTE — Assessment & Plan Note (Signed)
At home fasting glucose is sometimes over 100  Lab Results  Component Value Date   HGBA1C 5.4 12/12/2020   This is reassuring  His current symptoms do not appear to be from diabetes

## 2020-12-12 NOTE — Patient Instructions (Signed)
Take care of yourself Keep exercising and eating well   Urine test on the way out   A1c is good   I placed a referral to neurology -you will get a call to schedule appt

## 2020-12-12 NOTE — Assessment & Plan Note (Signed)
Fingers and toes  Nl A1C Nl B12 in the past with supplement  No other neuro symptoms  Pt is new to me-chart and old labs reviewed  Suspect neuropathy of some type Reassuring exam  Ref made to neurology for eval/tx

## 2020-12-12 NOTE — Assessment & Plan Note (Signed)
bp in fair control at this time  BP Readings from Last 1 Encounters:  12/12/20 122/80   No changes needed Most recent labs reviewed  Disc lifstyle change with low sodium diet and exercise  Plan to continue amlodipine 5 mg daily and triam- hct 37.5-25 mg daily

## 2020-12-12 NOTE — Progress Notes (Signed)
Subjective:    Patient ID: Brian Matthews, male    DOB: 08-15-1950, 70 y.o.   MRN: [id number]  This visit occurred during the SARS-CoV-2 public health emergency.  Safety protocols were in place, including screening questions prior to the visit, additional usage of staff PPE, and extensive cleaning of exam room while observing appropriate contact time as indicated for disinfecting solutions.   HPI 70 yo pt of NP Baity presents with numbness in fingers and toes and urinary frequently and dry mouth Also amlodipine refill    Wt Readings from Last 3 Encounters:  12/12/20 178 lb 3 oz (80.8 kg)  04/20/20 173 lb 8 oz (78.7 kg)  02/13/20 176 lb (79.8 kg)   24.68 kg/m  No immed fam h/o diabetes  A nephew suddenly came down with it - type 1   Numbness  Fingers and toes  Prickly, tingling Worse in feet with some exercise  Occ pain  This started several years ago  He has never seen neurology -it was suggested   Lab Brian  Component Value Date   VITAMINB12 591 02/22/2016   Lab Brian  Component Value Date   WBC 4.3 04/20/2020   HGB 16.3 04/20/2020   HCT 47.5 04/20/2020   MCV 87.5 04/20/2020   PLT 192 04/20/2020     Notes home fasting glucose is over 100 (not by much)    Freq urination  He does take trim-hct -and this may be the reason  One night had to urinate every 45 minutes That resolved  No dysuria except for once in a blue moon    Lab Brian  Component Value Date   PSA 0.48 04/20/2020   PSA 0.42 11/10/2018   PSA 0.56 09/24/2017   UA is clear today Brian Matthews, Brian "RAY" (MRN [id number]) as of 12/12/2020 18:17  Ref. Range 12/12/2020 14:59  Bilirubin, UA Unknown negative  Clarity, UA Unknown clear  Color, UA Unknown yellow  Glucose Latest Ref Range: Negative  Negative  Ketones, UA Unknown negative  Leukocytes,UA Latest Ref Range: Negative  Negative  Nitrite, UA Unknown negative  pH, UA Latest Ref Range: 5.0 - 8.0  7.0  Protein,UA Latest  Ref Range: Negative  Negative  Specific Gravity, UA Latest Ref Range: 1.010 - 1.025  1.015  Urobilinogen, UA Latest Ref Range: 0.2 or 1.0 E.U./dL 0.2  RBC, UA Unknown negative     Appetite ok  Mild fatigue  Dry mouth in pm (thirst is not extreme)   Had episode on Monday of similar symptoms that happened with pancreatitis  Light colorted soft stool and some abd discomfort   Last [name] 12/21 US Abdomen Complete (Accession [id number]) (Order [id number]) Imaging Date: 05/23/2020 Department: [facility] Released By: Shelda Pal Authorizing: Jearld Fenton, NP    Exam Status  Status  Final [99]   PACS Intelerad Image Link   Show images for US Abdomen Complete  Study Result  Narrative & Impression  CLINICAL DATA:  Elevated lipase, left upper quadrant pain   EXAM: ABDOMEN ULTRASOUND COMPLETE   COMPARISON:  None.   FINDINGS: Gallbladder: No gallstones or wall thickening visualized. No sonographic Murphy sign noted by sonographer.   Common bile duct: Diameter: 3.3 mm   Liver: Multiple echogenic solid masses within the liver. Posterior right hepatic lobe lesion measures 2.4 x 2.6 x 2.8 cm. Cyst central right hepatic lobe lesion measures 1.9 x 2 x 1.4 cm. Small echogenic mass in the anterior right hepatic lobe  measures 9 x 8.9 mm. Portal vein is patent on color Doppler imaging with normal direction of blood flow towards the liver.   IVC: No abnormality visualized.   Pancreas: Visualized portion unremarkable.   Spleen: Size and appearance within normal limits.   Right Kidney: Length: 9.9 cm. Echogenicity within normal limits. No mass or hydronephrosis visualized.   Left Kidney: Length: 10.9 cm. Cortical echogenicity is normal. No hydronephrosis. Multiple renal cysts. Largest are seen at the lower pole measuring 7.4 x 7.6 x 8.4 cm and at the upper pole measuring 6.2 x 4.3 x 3.9 cm.   Abdominal aorta: No aneurysm visualized.    Other findings: None.   IMPRESSION: 1. Multiple solid echogenic liver masses, probably represent hemangiomas. 2. Multiple left renal cysts.     Eats very well  Vegetarian except for occ fish  Exercises regularly   HTN bp is stable today  No cp or palpitations or headaches or edema  No side effects to medicines  BP Readings from Last 3 Encounters:  12/12/20 122/80  04/20/20 132/80  02/13/20 124/82    Takes amlodipine 5 mg daily  Triam-hct 37.5-25 mg daily   Lab Brian  Component Value Date   CREATININE 1.13 04/20/2020   BUN 14 04/20/2020   NA 141 04/20/2020   K 4.7 04/20/2020   CL 101 04/20/2020   CO2 24 04/20/2020   Lab Brian  Component Value Date   HGBA1C 5.3 02/22/2016   Lab Brian  Component Value Date   HGBA1C 5.4 12/12/2020    Review of Systems  Constitutional:  Negative for activity change, appetite change, fatigue, fever and unexpected weight change.  HENT:  Negative for congestion, rhinorrhea, sore throat and trouble swallowing.        Dry mouth  Eyes:  Negative for pain, redness, itching and visual disturbance.  Respiratory:  Negative for cough, chest tightness, shortness of breath and wheezing.   Cardiovascular:  Negative for chest pain, palpitations and leg swelling.  Gastrointestinal:  Negative for abdominal pain, blood in stool, constipation, diarrhea and nausea.  Endocrine: Negative for cold intolerance, heat intolerance, polydipsia and polyuria.  Genitourinary:  Positive for frequency. Negative for decreased urine volume, difficulty urinating, dysuria and urgency.  Musculoskeletal:  Negative for arthralgias, joint swelling and myalgias.  Skin:  Negative for pallor and rash.  Neurological:  Positive for numbness. Negative for dizziness, tremors, facial asymmetry, speech difficulty, weakness and headaches.  Hematological:  Negative for adenopathy. Does not bruise/bleed easily.  Psychiatric/Behavioral:  Negative for decreased concentration  and dysphoric mood. The patient is not nervous/anxious.       Objective:   Physical Exam Constitutional:      General: He is not in acute distress.    Appearance: Normal appearance. He is well-developed and normal weight.  HENT:     Head: Normocephalic and atraumatic.  Eyes:     Conjunctiva/sclera: Conjunctivae normal.     Pupils: Pupils are equal, round, and reactive to light.  Neck:     Thyroid: No thyromegaly.     Vascular: No carotid bruit or JVD.  Cardiovascular:     Rate and Rhythm: Normal rate and regular rhythm.     Heart sounds: Normal heart sounds.    No gallop.  Pulmonary:     Effort: Pulmonary effort is normal. No respiratory distress.     Breath sounds: Normal breath sounds. No wheezing or rales.  Abdominal:     General: Bowel sounds are normal. There is no distension or  abdominal bruit.     Palpations: Abdomen is soft. There is no mass.     Tenderness: There is no abdominal tenderness. There is no right CVA tenderness or left CVA tenderness.     Comments: No suprapubic tenderness or fullness    Musculoskeletal:     Cervical back: Normal range of motion and neck supple.     Right lower leg: No edema.     Left lower leg: No edema.  Lymphadenopathy:     Cervical: No cervical adenopathy.  Skin:    General: Skin is warm and dry.     Coloration: Skin is not pale.     Findings: No erythema or rash.  Neurological:     Mental Status: He is alert.     Cranial Nerves: No cranial nerve deficit.     Motor: No weakness.     Coordination: Coordination normal.     Gait: Gait normal.     Deep Tendon Reflexes: Reflexes are normal and symmetric. Reflexes normal.     Comments: Nl sensation to soft touch and temp on fingers and toes  Psychiatric:        Mood and Affect: Mood normal.     Comments: Pleasant           Assessment & Plan:   Problem List Items Addressed This Visit       Cardiovascular and Mediastinum   Benign essential HTN - Primary    bp in fair  control at this time  BP Readings from Last 1 Encounters:  12/12/20 122/80  No changes needed Most recent labs reviewed  Disc lifstyle change with low sodium diet and exercise  Plan to continue amlodipine 5 mg daily and triam- hct 37.5-25 mg daily          Other   Frequent urination    Most likely from triam-hct  Nl a1c  Has had nl psa and urology f/u  ua pending       Relevant Orders   POCT glycosylated hemoglobin (Hb A1C) (Completed)   POCT Urinalysis Dipstick (Automated)   Numbness of fingers    Fingers and toes  Nl A1C Nl B12 in the past with supplement  No other neuro symptoms  Pt is new to me-chart and old labs reviewed  Suspect neuropathy of some type Reassuring exam  Ref made to neurology for eval/tx        Relevant Orders   POCT glycosylated hemoglobin (Hb A1C) (Completed)   Ambulatory referral to Neurology   Elevated glucose    At home fasting glucose is sometimes over 100  Lab Brian  Component Value Date   HGBA1C 5.4 12/12/2020   This is reassuring  His current symptoms do not appear to be from diabetes        Relevant Orders   POCT glycosylated hemoglobin (Hb A1C) (Completed)

## 2020-12-14 ENCOUNTER — Encounter: Payer: Self-pay | Admitting: Neurology

## 2020-12-14 ENCOUNTER — Other Ambulatory Visit: Payer: Self-pay | Admitting: Internal Medicine

## 2020-12-14 DIAGNOSIS — I10 Essential (primary) hypertension: Secondary | ICD-10-CM

## 2020-12-22 DIAGNOSIS — Z23 Encounter for immunization: Secondary | ICD-10-CM | POA: Diagnosis not present

## 2020-12-25 ENCOUNTER — Emergency Department
Admission: EM | Admit: 2020-12-25 | Discharge: 2020-12-25 | Disposition: A | Discharge status: Home / Self Care | Payer: Medicare Other | Attending: Emergency Medicine | Admitting: Emergency Medicine

## 2020-12-25 ENCOUNTER — Encounter: Payer: Self-pay | Admitting: Emergency Medicine

## 2020-12-25 ENCOUNTER — Emergency Department: Payer: Medicare Other

## 2020-12-25 ENCOUNTER — Other Ambulatory Visit: Payer: Self-pay

## 2020-12-25 DIAGNOSIS — I10 Essential (primary) hypertension: Secondary | ICD-10-CM | POA: Diagnosis not present

## 2020-12-25 DIAGNOSIS — Z79899 Other long term (current) drug therapy: Secondary | ICD-10-CM | POA: Diagnosis not present

## 2020-12-25 DIAGNOSIS — R519 Headache, unspecified: Secondary | ICD-10-CM

## 2020-12-25 DIAGNOSIS — J45909 Unspecified asthma, uncomplicated: Secondary | ICD-10-CM | POA: Insufficient documentation

## 2020-12-25 LAB — CBC WITH DIFFERENTIAL/PLATELET
Abs Immature Granulocytes: 0.01 10*3/uL (ref 0.00–0.07)
Basophils Absolute: 0 10*3/uL (ref 0.0–0.1)
Basophils Relative: 0 %
Eosinophils Absolute: 0.1 10*3/uL (ref 0.0–0.5)
Eosinophils Relative: 3 %
HCT: 44.8 % (ref 39.0–52.0)
Hemoglobin: 15.5 g/dL (ref 13.0–17.0)
Immature Granulocytes: 0 %
Lymphocytes Relative: 12 %
Lymphs Abs: 0.5 10*3/uL — ABNORMAL LOW (ref 0.7–4.0)
MCH: 30.5 pg (ref 26.0–34.0)
MCHC: 34.6 g/dL (ref 30.0–36.0)
MCV: 88.2 fL (ref 80.0–100.0)
Monocytes Absolute: 0.6 10*3/uL (ref 0.1–1.0)
Monocytes Relative: 14 %
Neutro Abs: 3.3 10*3/uL (ref 1.7–7.7)
Neutrophils Relative %: 71 %
Platelets: 152 10*3/uL (ref 150–400)
RBC: 5.08 MIL/uL (ref 4.22–5.81)
RDW: 13.3 % (ref 11.5–15.5)
WBC: 4.6 10*3/uL (ref 4.0–10.5)
nRBC: 0 % (ref 0.0–0.2)

## 2020-12-25 LAB — BASIC METABOLIC PANEL
Anion gap: 7 (ref 5–15)
BUN: 14 mg/dL (ref 8–23)
CO2: 30 mmol/L (ref 22–32)
Calcium: 9.2 mg/dL (ref 8.9–10.3)
Chloride: 102 mmol/L (ref 98–111)
Creatinine, Ser: 1.24 mg/dL (ref 0.61–1.24)
GFR, Estimated: >60 mL/min (ref >60)
Glucose, Bld: 105 mg/dL — ABNORMAL HIGH (ref 70–99)
Potassium: 5 mmol/L (ref 3.5–5.1)
Sodium: 139 mmol/L (ref 135–145)

## 2020-12-25 MED ORDER — DIPHENHYDRAMINE HCL 50 MG/ML IJ SOLN
25.0000 mg | Freq: Once | INTRAMUSCULAR | Status: AC
  Administered 2020-12-25: 25 mg via INTRAVENOUS
  Filled 2020-12-25: qty 1

## 2020-12-25 MED ORDER — PROCHLORPERAZINE MALEATE 10 MG PO TABS: 10.0000 mg | ORAL_TABLET | Freq: Four times a day (QID) | ORAL | 0 refills | Status: DC | PRN

## 2020-12-25 MED ORDER — PROCHLORPERAZINE EDISYLATE 10 MG/2ML IJ SOLN
10.0000 mg | Freq: Once | INTRAMUSCULAR | Status: AC
  Administered 2020-12-25: 10 mg via INTRAVENOUS
  Filled 2020-12-25: qty 2

## 2020-12-25 NOTE — ED Triage Notes (Signed)
C/O right sided headache -- pain to right side of head and behind right eye.  States symptom onset 2 days ago.  AAOx3.  Skin warm and dry. MAE equally and strong.  Speech clear.  Gait steady.  NAD  Facial movements equal.

## 2020-12-25 NOTE — ED Provider Notes (Signed)
[facility] Emergency Department Provider Note   ____________________________________________   Event Date/Time   First MD Initiated Contact with Patient 12/25/20 1516     (approximate)  I have reviewed the triage vital signs and the nursing notes.   HISTORY  Chief Complaint Headache    HPI Brian Matthews is a 70 y.o. male with past medical history of hypertension who presents to the ED complaining of headache.  Patient reports that he has had 3 days of gradually worsening headache affecting the right side of his head.  He describes it as a constant throbbing that is worse with certain positions or when he goes to cough.  It affects the entire right side of his head as well as the area behind his right eye.  He denies any vision changes, speech changes, numbness, or weakness.  He reports having incapacitating migraines when he was in the [employer], but has not had a migraine in many years and states current headache is different.  He denies any fevers, neck stiffness, chest pain, or shortness of breath.  He has not taken anything for his symptoms prior to arrival.        Past Medical History:  Diagnosis Date   Allergy    Childhood asthma    History of blood transfusion    Hx of adenomatous polyp of colon 04/29/2017   Hypertension    Hyperthyroidism    Positive TB test     Patient Active Problem List   Diagnosis Date Noted   Frequent urination 12/12/2020   Numbness of fingers 12/12/2020   Elevated glucose 12/12/2020   GERD (gastroesophageal reflux disease) 04/20/2020   Subacute thyroiditis 11/18/2016   Benign essential HTN 10/17/2014    Past Surgical History:  Procedure Laterality Date   COLONOSCOPY  04/2017   Dr Carlean Purl    Prior to Admission medications   Medication Sig Start Date End Date Taking? Authorizing Provider  prochlorperazine (COMPAZINE) 10 MG tablet Take 1 tablet (10 mg total) by mouth every 6 (six) hours as needed for  nausea or vomiting. 12/25/20  Yes Blake Divine, MD  amLODipine (NORVASC) 5 MG tablet TAKE 1 TABLET(5 MG) BY MOUTH DAILY 12/16/20   Dutch Quint B, FNP  Cholecalciferol (VITAMIN D-3 PO) Take 1 capsule by mouth 2 (two) times a week.     [provider]  Cyanocobalamin (VITAMIN B-12 PO) Take 1 tablet by mouth 2 (two) times a week.     [provider]  Misc Natural Products (GLUCOS-CHONDROIT-MSM COMPLEX) TABS Take 2 tablets by mouth daily.    [provider]  Multiple Vitamin (MULTIVITAMIN) tablet Take 1 tablet by mouth daily.    [provider]  Omega-3 Fatty Acids (FISH OIL) 1200 MG CAPS Take 1 capsule by mouth 3 (three) times daily.     [provider]  OVER THE COUNTER MEDICATION Prostate Blend 1000 mg 3  tablets  daily.    [provider]  Saw Palmetto 450 MG CAPS Take 6 capsules by mouth daily.     [provider]  triamterene-hydrochlorothiazide (MAXZIDE-25) 37.5-25 MG tablet TAKE 1 TABLET BY MOUTH DAILY 11/15/20   Dutch Quint B, FNP    Allergies Lisinopril and Penicillins  Family History  Problem Relation Age of Onset   Hypertension Mother    Arthritis Father    Colon cancer Sister    Thyroid disease Neg Hx    Esophageal cancer Neg Hx    Pancreatic cancer Neg Hx  Rectal cancer Neg Hx    Stomach cancer Neg Hx     Social History Social History   Tobacco Use   Smoking status: Never   Smokeless tobacco: Never  Vaping Use   Vaping Use: Never used  Substance Use Topics   Alcohol use: No    Alcohol/week: 0.0 standard drinks   Drug use: No    Review of Systems  Constitutional: No fever/chills Eyes: No visual changes. ENT: No sore throat. Cardiovascular: Denies chest pain. Respiratory: Denies shortness of breath. Gastrointestinal: No abdominal pain.  No nausea, no vomiting.  No diarrhea.  No constipation. Genitourinary: Negative for dysuria. Musculoskeletal: Negative for back pain. Skin: Negative for  rash. Neurological: Positive for headaches, negative for focal weakness or numbness.  ____________________________________________   PHYSICAL EXAM:  VITAL SIGNS: ED Triage Vitals  Enc Vitals Group     BP 12/25/20 1435 130/80     Pulse Rate 12/25/20 1435 77     Resp 12/25/20 1435 18     Temp 12/25/20 1435 97.8 F (36.6 C)     Temp Source 12/25/20 1435 Oral     SpO2 12/25/20 1435 98 %     Weight 12/25/20 1355 178 lb 2.1 oz (80.8 kg)     Height 12/25/20 1355 5' 11.25" (1.81 m)     Head Circumference --      Peak Flow --      Pain Score 12/25/20 1355 8     Pain Loc --      Pain Edu? --      Excl. in [place]? --     Constitutional: Alert and oriented. Eyes: Conjunctivae are normal.  Pupils equal, round, and reactive to light bilaterally.  Extraocular movements intact. Head: Atraumatic.  No temporal tenderness noted. Nose: No congestion/rhinnorhea. Mouth/Throat: Mucous membranes are moist. Neck: Normal ROM Cardiovascular: Normal rate, regular rhythm. Grossly normal heart sounds.  2+ radial pulses bilaterally. Respiratory: Normal respiratory effort.  No retractions. Lungs CTAB. Gastrointestinal: Soft and nontender. No distention. Genitourinary: deferred Musculoskeletal: No lower extremity tenderness nor edema. Neurologic:  Normal speech and language. No gross focal neurologic deficits are appreciated. Skin:  Skin is warm, dry and intact. No rash noted. Psychiatric: Mood and affect are normal. Speech and behavior are normal.  ____________________________________________   LABS (all labs ordered are listed, but only abnormal results are displayed)  Labs Reviewed  CBC WITH DIFFERENTIAL/PLATELET - Abnormal; Notable for the following components:      Result Value   Lymphs Abs 0.5 (*)    All other components within normal limits  BASIC METABOLIC PANEL - Abnormal; Notable for the following components:   Glucose, Bld 105 (*)    All other components within normal limits      PROCEDURES  Procedure(s) performed (including Critical Care):  Procedures   ____________________________________________   INITIAL IMPRESSION / ASSESSMENT AND PLAN / ED COURSE      70 year old male with past medical history of hypertension presents to the ED complaining of gradually worsening right-sided headache that extends behind his right eye for the past 3 days.  Patient has no focal neurologic deficits on exam and I doubt stroke, no fever or meningismus to suggest meningitis.  He does have a remote history of migraines but states current headache is not similar.  No temporal tenderness to suggest temporal arteritis.  We will further assess with CT head, treat with migraine cocktail and reassess.  CT head reviewed by me with no obvious hemorrhage, negative for acute process  per radiology.  Labs are also unremarkable, patient states he is feeling better following migraine cocktail.  Symptoms sound most consistent with migraine and patient is appropriate for discharge home with PCP follow-up, he was counseled to return to the ED for new worsening symptoms.  Patient agrees with plan.      ____________________________________________   FINAL CLINICAL IMPRESSION(S) / ED DIAGNOSES  Final diagnoses:  Acute nonintractable headache, unspecified headache type     ED Discharge Orders          Ordered    prochlorperazine (COMPAZINE) 10 MG tablet  Every 6 hours PRN        12/25/20 1731             Note:  This document was prepared using Dragon voice recognition software and may include unintentional dictation errors.    Blake Divine, MD 12/25/20 [phone]

## 2021-01-13 ENCOUNTER — Ambulatory Visit (HOSPITAL_COMMUNITY)
Admission: EM | Admit: 2021-01-13 | Discharge: 2021-01-13 | Disposition: A | Discharge status: Home / Self Care | Payer: Medicare Other | Attending: Internal Medicine | Admitting: Internal Medicine

## 2021-01-13 ENCOUNTER — Encounter (HOSPITAL_COMMUNITY): Payer: Self-pay

## 2021-01-13 ENCOUNTER — Ambulatory Visit (INDEPENDENT_AMBULATORY_CARE_PROVIDER_SITE_OTHER): Payer: Medicare Other

## 2021-01-13 ENCOUNTER — Other Ambulatory Visit: Payer: Self-pay

## 2021-01-13 DIAGNOSIS — M25531 Pain in right wrist: Secondary | ICD-10-CM | POA: Diagnosis not present

## 2021-01-13 DIAGNOSIS — M7989 Other specified soft tissue disorders: Secondary | ICD-10-CM | POA: Diagnosis not present

## 2021-01-13 DIAGNOSIS — S62111A Displaced fracture of triquetrum [cuneiform] bone, right wrist, initial encounter for closed fracture: Secondary | ICD-10-CM | POA: Diagnosis not present
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
## 2021-01-13 NOTE — Progress Notes (Signed)
Orthopedic Tech Progress Note Patient Details:  Brian Matthews 1950-12-28 [id number]   Ortho Devices Type of Ortho Device: Volar splint Ortho Device/Splint Location: RUE Ortho Device/Splint Interventions: Ordered, Application   Post Interventions Patient Tolerated: Well Instructions Provided: Care of device  Brian Matthews [name] 01/13/2021, 3:29 PM

## 2021-01-13 NOTE — Discharge Instructions (Addendum)
-  Please leave your wrist splint on at all times until evaluation by orthopedist.  -For pain: tylenol and ibuprofen -Rest, ice -Please follow-up with an orthopedist for further evaluation and management. I recommend [facility] at [address] [place], [place] [address]. You can schedule an appointment by calling [phone]) or online ([email or web address]), but they also have a walk-in clinic M-F 8a-8p and Sat 10a-3p. Schedule an appointment at their earliest convenience for further management.

## 2021-01-13 NOTE — ED Notes (Signed)
Ortho tech applied splint 

## 2021-01-13 NOTE — ED Notes (Signed)
Ortho tech called 

## 2021-01-17 DIAGNOSIS — M25531 Pain in right wrist: Secondary | ICD-10-CM | POA: Diagnosis not present

## 2021-01-17 DIAGNOSIS — S6291XA Unspecified fracture of right wrist and hand, initial encounter for closed fracture: Secondary | ICD-10-CM | POA: Diagnosis not present

## 2021-02-01 ENCOUNTER — Other Ambulatory Visit: Payer: Self-pay

## 2021-02-01 ENCOUNTER — Ambulatory Visit (INDEPENDENT_AMBULATORY_CARE_PROVIDER_SITE_OTHER): Payer: Medicare Other | Admitting: Nurse Practitioner

## 2021-02-01 ENCOUNTER — Encounter: Payer: Self-pay | Admitting: Nurse Practitioner

## 2021-02-01 VITALS — BP 130/64 | HR 57 | Temp 97.9°F | Resp 10 | Ht 71.25 in | Wt 175.5 lb

## 2021-02-01 DIAGNOSIS — K219 Gastro-esophageal reflux disease without esophagitis: Secondary | ICD-10-CM

## 2021-02-01 DIAGNOSIS — T148XXA Other injury of unspecified body region, initial encounter: Secondary | ICD-10-CM | POA: Diagnosis not present

## 2021-02-01 DIAGNOSIS — J312 Chronic pharyngitis: Secondary | ICD-10-CM | POA: Diagnosis not present

## 2021-02-01 DIAGNOSIS — I10 Essential (primary) hypertension: Secondary | ICD-10-CM

## 2021-02-01 NOTE — Assessment & Plan Note (Signed)
Patient concerned because of discoloration and "knot" or venipuncture was performed recent emergency department visit.  This was a his right upper arm, right above AC.Marland Kitchen  Discoloration has improved but still present.  Had a "knot" that is still there.  Reassured patient that this should resolve in time.  No concern for cellulitis or phlebitis currently.  We will continue to monitor

## 2021-02-01 NOTE — Assessment & Plan Note (Signed)
States had diagnosis in the past.  Has not been on any medication.  Does not feel like he has GERD.  We will continue to monitor.

## 2021-02-01 NOTE — Progress Notes (Signed)
Established Patient Office Visit  Subjective:  Patient ID: Brian Matthews, male    DOB: 1951/05/12  Age: 70 y.o. MRN: [id number]  CC:  Chief Complaint  Patient presents with   Transfer of Care    From [facility]   Sore Throat    Has noticed that after talking for about 15 minutes or so he starts having a pain in his throat. Sx present for about 2 years maybe. Both sides of the throat/neck.   Issue with bruising after venipuncture at ER    Top right arm, left a bruising and "lumps" feeling in the vein    HPI Brian Matthews presents for Transfer of care  HTN: States he does have a blood pressure cuff and checks several times weekly. Does job, riding bike, and at home exercises approx 4 times a week  GERD: unsure if that was a true thing or just dx. Not currently treated fo rthe same  Throat pain: Has been around approx 2 years. When talk about 15-20s he develops a sore throat, intermittently. Not every time. Once he stops talking the pain will resolve  Skin bruising/knot: patient was concerned because he was at the ED and they drew blood out of his right upper arm, no complaints at the time. After he noticed skin discoloration (likely bruising) and a knot where they drew the blood. The discoloration is improving.   Past Medical History:  Diagnosis Date   Allergy    Childhood asthma    History of blood transfusion    Hx of adenomatous polyp of colon 04/29/2017   Hypertension    Hyperthyroidism    Positive TB test     Past Surgical History:  Procedure Laterality Date   COLONOSCOPY  04/2017   Dr Carlean Purl    Family History  Problem Relation Age of Onset   Hypertension Mother    Arthritis Father    Colon cancer Sister    Thyroid disease Neg Hx    Esophageal cancer Neg Hx    Pancreatic cancer Neg Hx    Rectal cancer Neg Hx    Stomach cancer Neg Hx     Social History   Socioeconomic History   Marital status: Divorced    Spouse name: Not on file   Number of  children: Not on file   Years of education: Not on file   Highest education level: Not on file  Occupational History   Not on file  Tobacco Use   Smoking status: Never   Smokeless tobacco: Never  Vaping Use   Vaping Use: Never used  Substance and Sexual Activity   Alcohol use: No    Alcohol/week: 0.0 standard drinks   Drug use: No   Sexual activity: Not on file  Other Topics Concern   Not on file  Social History Narrative   Not on file   Social Determinants of Health   Financial Resource Strain: Not on file  Food Insecurity: Not on file  Transportation Needs: Not on file  Physical Activity: Not on file  Stress: Not on file  Social Connections: Not on file  Intimate Partner Violence: Not on file    Outpatient Medications Prior to Visit  Medication Sig Dispense Refill   amLODipine (NORVASC) 5 MG tablet TAKE 1 TABLET(5 MG) BY MOUTH DAILY 90 tablet 1   Cholecalciferol (VITAMIN D-3 PO) Take 1 capsule by mouth 2 (two) times a week.      Cyanocobalamin (VITAMIN B-12 PO) Take 1 tablet by  mouth 2 (two) times a week.      Misc Natural Products (GLUCOS-CHONDROIT-MSM COMPLEX) TABS Take 2 tablets by mouth daily.     Multiple Vitamin (MULTIVITAMIN) tablet Take 1 tablet by mouth daily.     Omega-3 Fatty Acids (FISH OIL) 1200 MG CAPS Take 1 capsule by mouth 3 (three) times daily.      OVER THE COUNTER MEDICATION Prostate Blend 1000 mg 3  tablets  daily.     Saw Palmetto 450 MG CAPS Take 6 capsules by mouth daily.      triamterene-hydrochlorothiazide (MAXZIDE-25) 37.5-25 MG tablet TAKE 1 TABLET BY MOUTH DAILY 90 tablet 0   prochlorperazine (COMPAZINE) 10 MG tablet Take 1 tablet (10 mg total) by mouth every 6 (six) hours as needed for nausea or vomiting. 12 tablet 0   No facility-administered medications prior to visit.    Allergies  Allergen Reactions   Lisinopril Other (See Comments)    cough   Penicillins Other (See Comments)    Does not remember    ROS Review of Systems   Constitutional:  Negative for chills and fever.  Eyes:  Negative for visual disturbance.  Respiratory:  Negative for cough and shortness of breath.   Cardiovascular:  Negative for chest pain.  Gastrointestinal:  Negative for blood in stool, diarrhea, nausea and vomiting.  Neurological:  Negative for weakness.  Psychiatric/Behavioral:  Negative for suicidal ideas.      Objective:    Physical Exam Vitals and nursing note reviewed.  Constitutional:      Appearance: He is normal weight.  HENT:     Mouth/Throat:     Mouth: Mucous membranes are moist.     Pharynx: Oropharynx is clear. No oropharyngeal exudate or posterior oropharyngeal erythema.     Comments: Longer than normal uvula Cardiovascular:     Rate and Rhythm: Regular rhythm. Bradycardia present.     Heart sounds: Normal heart sounds.  Pulmonary:     Effort: Pulmonary effort is normal.     Breath sounds: Normal breath sounds.  Abdominal:     General: Bowel sounds are normal.  Musculoskeletal:     Cervical back: No tenderness.  Lymphadenopathy:     Cervical: No cervical adenopathy.  Skin:    General: Skin is warm.     Findings: Ecchymosis (slight discoloration following vien in upper arm) present.       Neurological:     Mental Status: He is alert.    BP 130/64   Pulse (!) 57   Temp 97.9 F (36.6 C)   Resp 10   Ht 5' 11.25" (1.81 m)   Wt 175 lb 8 oz (79.6 kg)   SpO2 97%   BMI 24.31 kg/m  Wt Readings from Last 3 Encounters:  02/01/21 175 lb 8 oz (79.6 kg)  12/25/20 178 lb 2.1 oz (80.8 kg)  12/12/20 178 lb 3 oz (80.8 kg)     Health Maintenance Due  Topic Date Due   Zoster Vaccines- Shingrix (1 of 2) Never done   COVID-19 Vaccine (4 - Booster for Moderna series) 08/24/2020   INFLUENZA VACCINE  01/14/2021    There are no preventive care reminders to display for this patient.  Lab Results  Component Value Date   TSH 2.55 04/20/2020   Lab Results  Component Value Date   WBC 4.6 12/25/2020   HGB  15.5 12/25/2020   HCT 44.8 12/25/2020   MCV 88.2 12/25/2020   PLT 152 12/25/2020   Lab Results  Component Value Date   NA 139 12/25/2020   K 5.0 12/25/2020   CO2 30 12/25/2020   GLUCOSE 105 (H) 12/25/2020   BUN 14 12/25/2020   CREATININE 1.24 12/25/2020   BILITOT 0.8 04/20/2020   ALKPHOS 55 11/10/2018   AST 22 04/20/2020   ALT 16 04/20/2020   PROT 7.0 04/20/2020   ALBUMIN 4.4 11/10/2018   CALCIUM 9.2 12/25/2020   ANIONGAP 7 12/25/2020   GFR 83.42 11/10/2018   Lab Results  Component Value Date   CHOL 167 04/20/2020   Lab Results  Component Value Date   HDL 35 (L) 04/20/2020   Lab Results  Component Value Date   LDLCALC 109 (H) 04/20/2020   Lab Results  Component Value Date   TRIG 115 04/20/2020   Lab Results  Component Value Date   CHOLHDL 4.8 04/20/2020   Lab Results  Component Value Date   HGBA1C 5.4 12/12/2020      Assessment & Plan:   Problem List Items Addressed This Visit       Cardiovascular and Mediastinum   Benign essential HTN - Primary    Currently maintained on amlodipine and Maxide.  Patient does have the ability to check blood pressures at home and checks several times a week.  Per patient report all within normal limits.  Patient also exercises frequently.  Blood pressure in office within normal limits.  Continue amlodipine and Maxide as prescribed.  Continue checking blood pressures at home.        Digestive   GERD (gastroesophageal reflux disease)    States had diagnosis in the past.  Has not been on any medication.  Does not feel like he has GERD.  We will continue to monitor.        Other   Sore throat, chronic    Patient had concern of intermittent sore throat over the past approximately 2 years.  States he only feels it when he talks for extended period time 15 to 20 minutes.  When questioned it does not always happen when he talks 15 to 20 minutes just intermittently.  Simply stops talking the sore throat goes away.  He was  curious about same.  Did discuss if frequency increases we can try him on a PPI to make sure throat is not getting irritated from the silent reflux.  If that was not beneficial we could always send him to ENT.  After discussion, continue to monitor for now.      Bruising    Patient concerned because of discoloration and "knot" or venipuncture was performed recent emergency department visit.  This was a his right upper arm, right above AC.Marland Kitchen  Discoloration has improved but still present.  Had a "knot" that is still there.  Reassured patient that this should resolve in time.  No concern for cellulitis or phlebitis currently.  We will continue to monitor       No orders of the defined types were placed in this encounter.   Follow-up: Return in about 3 months (around 05/04/2021) for AWV and labs.   This visit occurred during the SARS-CoV-2 public health emergency.  Safety protocols were in place, including screening questions prior to the visit, additional usage of staff PPE, and extensive cleaning of exam room while observing appropriate contact time as indicated for disinfecting solutions.   Romilda Garret, NP

## 2021-02-01 NOTE — Assessment & Plan Note (Signed)
Patient had concern of intermittent sore throat over the past approximately 2 years.  States he only feels it when he talks for extended period time 15 to 20 minutes.  When questioned it does not always happen when he talks 15 to 20 minutes just intermittently.  Simply stops talking the sore throat goes away.  He was curious about same.  Did discuss if frequency increases we can try him on a PPI to make sure throat is not getting irritated from the silent reflux.  If that was not beneficial we could always send him to ENT.  After discussion, continue to monitor for now.

## 2021-02-01 NOTE — Patient Instructions (Signed)
Nice seeing you today. Will follow up with you in approx 3 months for your wellness exam. Keep you other appointments as scheduled Follow up sooner than 3 months if you need [name]

## 2021-02-01 NOTE — Assessment & Plan Note (Signed)
Currently maintained on amlodipine and Maxide.  Patient does have the ability to check blood pressures at home and checks several times a week.  Per patient report all within normal limits.  Patient also exercises frequently.  Blood pressure in office within normal limits.  Continue amlodipine and Maxide as prescribed.  Continue checking blood pressures at home.

## 2021-02-07 DIAGNOSIS — S6291XD Unspecified fracture of right wrist and hand, subsequent encounter for fracture with routine healing: Secondary | ICD-10-CM | POA: Diagnosis not present

## 2021-02-08 NOTE — ED Notes (Signed)
Patient had sent a letter asking to speak with someone.  I called him, and he was concerned about the place where his blood was drawn on 12/25/20 ED visit.  He says it has bumps along the vein.   Says it does not hurt and is not red or hot to touch.  He wanted to know why it happened and if it will go away.  I explained that some medications can irritate the vein, and that could be what happened.  I told him that it would likely go away on its own.  He has had it examined by his pcp as well.

## 2021-02-13 ENCOUNTER — Other Ambulatory Visit: Payer: Self-pay | Admitting: Family

## 2021-02-13 DIAGNOSIS — I10 Essential (primary) hypertension: Secondary | ICD-10-CM

## 2021-02-18 ENCOUNTER — Encounter: Payer: Self-pay | Admitting: Nurse Practitioner

## 2021-02-19 ENCOUNTER — Other Ambulatory Visit: Payer: Self-pay | Admitting: Nurse Practitioner

## 2021-02-20 NOTE — Progress Notes (Signed)
[facility] Clinic Note - Initial Visit   Date: 02/21/21  Brian Matthews MRN: [id number] DOB: Apr 22, 1951   Dear Dr. Glori Bickers:  Thank you for your kind referral of Brian Matthews for consultation of numbness/tingling. Although his history is well known to you, please allow Brian Matthews to reiterate it for the purpose of our medical record. The patient was accompanied to the clinic by self.    History of Present Illness: Brian Matthews is a 70 y.o. right-handed male with hypertension presenting for evaluation of bilateral hand and feet tingling.   For the past 10 years, he has noticed numbness involving the last 2-3 toes, which he initially noticed when jogging.  Since this time, he has spells of numbness involving the toes, which was occurring a few times per week, however since starting alpha lipoic acid, it occurs about twice per month, lasting a few minutes.  No specific triggers.   He also has rare spells of numbness invovling the left 5th finger and palm.  It occurs once every few months, last a few minutes.    No weakness or the arms or legs.  No imbalance or falls. No diabetes or history of alcohol abuse.   He tells me that he checks his blood sugar every few days and noticed that the numbers were trending up slightly, even though he does not have diabetes and his HbA1c is 5.4.  Because of this, he was concerned about neuropathy and requested neurological evaluation.   Out-side paper records, electronic medical record, and images have been reviewed where available and summarized as:  Lab Results  Component Value Date   HGBA1C 5.4 12/12/2020   Lab Results  Component Value Date   [id number] 02/22/2016   Lab Results  Component Value Date   TSH 2.55 04/20/2020    Past Medical History:  Diagnosis Date   Allergy    Broken wrist    Childhood asthma    History of blood transfusion    Hx of adenomatous polyp of colon 04/29/2017   Hypertension     Hyperthyroidism    Positive TB test     Past Surgical History:  Procedure Laterality Date   COLONOSCOPY  04/2017   Dr Carlean Purl     Medications:  Outpatient Encounter Medications as of 02/21/2021  Medication Sig   amLODipine (NORVASC) 5 MG tablet TAKE 1 TABLET(5 MG) BY MOUTH DAILY   Cholecalciferol (VITAMIN D-3 PO) Take 1 capsule by mouth 2 (two) times a week.    Cyanocobalamin (VITAMIN B-12 PO) Take 1 tablet by mouth 2 (two) times a week.    Misc Natural Products (GLUCOS-CHONDROIT-MSM COMPLEX) TABS Take 2 tablets by mouth daily.   Multiple Vitamin (MULTIVITAMIN) tablet Take 1 tablet by mouth daily.   Omega-3 Fatty Acids (FISH OIL) 1200 MG CAPS Take 1 capsule by mouth 3 (three) times daily.    OVER THE COUNTER MEDICATION Prostate Blend 1000 mg 3  tablets  daily.   Saw Palmetto 450 MG CAPS Take 6 capsules by mouth daily.    triamterene-hydrochlorothiazide (MAXZIDE-25) 37.5-25 MG tablet TAKE 1 TABLET BY MOUTH DAILY   No facility-administered encounter medications on file as of 02/21/2021.    Allergies:  Allergies  Allergen Reactions   Lisinopril Other (See Comments)    cough   Penicillins Other (See Comments)    Does not remember    Family History: Family History  Problem Relation Age of Onset   Hypertension Mother    Arthritis Father  Rheumatoid Artritis   Heart Problems Father    Colon cancer Sister    Brain cancer Brother    Melanoma Brother    Thyroid disease Neg Hx    Esophageal cancer Neg Hx    Pancreatic cancer Neg Hx    Rectal cancer Neg Hx    Stomach cancer Neg Hx     Social History: Social History   Tobacco Use   Smoking status: Never   Smokeless tobacco: Never  Vaping Use   Vaping Use: Never used  Substance Use Topics   Alcohol use: No    Alcohol/week: 0.0 standard drinks   Drug use: No   Social History   Social History Narrative   Right Handed    Lives in a two story home     Vital Signs:  BP 127/76   Pulse 73   Ht '5\' 11"'$   (1.803 m)   Wt 175 lb (79.4 kg)   SpO2 97%   BMI 24.41 kg/m    Neurological Exam: MENTAL STATUS including orientation to time, place, person, recent and remote memory, attention span and concentration, language, and fund of knowledge is normal.  Speech is not dysarthric.  CRANIAL NERVES: II:  No visual field defects.     III-IV-VI: Pupils equal round and reactive to light.  Normal conjugate, extra-ocular eye movements in all directions of gaze.  No nystagmus.  No ptosis.   V:  Normal facial sensation.    VII:  Normal facial symmetry and movements.   VIII:  Normal hearing and vestibular function.   IX-X:  Normal palatal movement.   XI:  Normal shoulder shrug and head rotation.   XII:  Normal tongue strength and range of motion, no deviation or fasciculation.  MOTOR:  No atrophy, fasciculations or abnormal movements.  No pronator drift.   Upper Extremity:  Right  Left  Deltoid  5/5   5/5   Biceps  5/5   5/5   Triceps  5/5   5/5   Infraspinatus 5/5  5/5  Medial pectoralis 5/5  5/5  Wrist extensors  5/5   5/5   Wrist flexors  5/5   5/5   Finger extensors  5/5   5/5   Finger flexors  5/5   5/5   Dorsal interossei  5/5   5/5   Abductor pollicis  5/5   5/5   Tone (Ashworth scale)  0  0   Lower Extremity:  Right  Left  Hip flexors  5/5   5/5   Hip extensors  5/5   5/5   Adductor 5/5  5/5  Abductor 5/5  5/5  Knee flexors  5/5   5/5   Knee extensors  5/5   5/5   Dorsiflexors  5/5   5/5   Plantarflexors  5/5   5/5   Toe extensors  5/5   5/5   Toe flexors  5/5   5/5   Tone (Ashworth scale)  0  0   MSRs:  Right        Left                  brachioradialis 2+  2+  biceps 2+  2+  triceps 2+  2+  patellar 2+  2+  ankle jerk 2+  2+  Hoffman no  no  plantar response down  down   SENSORY:  Normal and symmetric perception of light touch, pinprick, vibration, and proprioception.  Romberg's sign absent.  COORDINATION/GAIT: Normal finger-to- nose-finger  Intact rapid alternating  movements bilaterally.   Gait narrow based and stable. Tandem and stressed gait intact.    IMPRESSION: Paresthesias of the toes, infrequent occurring twice per month and self-resolve.  Neurological exam is entirely normal with no evidence of neuropathy or radiculopathy.  Patient reassured. With his very infrequent symptoms and normal exam, electrodiagnostic testing is of very low diagnostic value, so I recommend that he continue to monitor symptoms and tested can be performed, if it gets worse.    Thank you for allowing me to participate in patient's care.  If I can answer any additional questions, I would be pleased to do so.    Sincerely,    Delma Drone K. Posey Pronto, DO

## 2021-02-21 ENCOUNTER — Ambulatory Visit (INDEPENDENT_AMBULATORY_CARE_PROVIDER_SITE_OTHER): Payer: Medicare Other | Admitting: Neurology

## 2021-02-21 ENCOUNTER — Other Ambulatory Visit: Payer: Self-pay

## 2021-02-21 ENCOUNTER — Encounter: Payer: Self-pay | Admitting: Neurology

## 2021-02-21 VITALS — BP 127/76 | HR 73 | Ht 71.0 in | Wt 175.0 lb

## 2021-02-21 DIAGNOSIS — R202 Paresthesia of skin: Secondary | ICD-10-CM

## 2021-02-21 NOTE — Patient Instructions (Signed)
If your symptoms get worse, please call my office to schedule nerve testing

## 2021-02-27 ENCOUNTER — Encounter: Payer: Self-pay | Admitting: Nurse Practitioner

## 2021-03-04 ENCOUNTER — Encounter: Payer: Self-pay | Admitting: Nurse Practitioner

## 2021-03-11 ENCOUNTER — Encounter: Payer: Self-pay | Admitting: Nurse Practitioner

## 2021-03-11 ENCOUNTER — Emergency Department: Payer: Medicare Other

## 2021-03-11 ENCOUNTER — Emergency Department
Admission: EM | Admit: 2021-03-11 | Discharge: 2021-03-11 | Disposition: A | Discharge status: Home / Self Care | Payer: Medicare Other | Attending: Emergency Medicine | Admitting: Emergency Medicine

## 2021-03-11 ENCOUNTER — Other Ambulatory Visit: Payer: Self-pay

## 2021-03-11 DIAGNOSIS — I10 Essential (primary) hypertension: Secondary | ICD-10-CM | POA: Diagnosis not present

## 2021-03-11 DIAGNOSIS — Z79899 Other long term (current) drug therapy: Secondary | ICD-10-CM | POA: Diagnosis not present

## 2021-03-11 DIAGNOSIS — J45909 Unspecified asthma, uncomplicated: Secondary | ICD-10-CM | POA: Insufficient documentation

## 2021-03-11 DIAGNOSIS — I82611 Acute embolism and thrombosis of superficial veins of right upper extremity: Secondary | ICD-10-CM | POA: Diagnosis not present

## 2021-03-11 DIAGNOSIS — T801XXA Vascular complications following infusion, transfusion and therapeutic injection, initial encounter: Secondary | ICD-10-CM

## 2021-03-11 DIAGNOSIS — I809 Phlebitis and thrombophlebitis of unspecified site: Secondary | ICD-10-CM | POA: Diagnosis not present

## 2021-03-11 DIAGNOSIS — I808 Phlebitis and thrombophlebitis of other sites: Secondary | ICD-10-CM | POA: Diagnosis not present

## 2021-03-11 NOTE — ED Provider Notes (Signed)
Emergency Medicine Provider Triage Evaluation Note  Brian Matthews , a 70 y.o. male  was evaluated in triage.  Pt complains of RUE superficial vein inflammation. He recalls a visit in July where he had an iV placed in his upper right arm. Subsequent to that, he has continued to experience vessel tenderness, nodularity, and concern for a blood clot. He denies UE edema, erythema, warmth, distal paresthesia, skin temp/color changes, or chest pain. He presents at the advice of his PCP for evaluation of phlebitis.   Review of Systems  Positive: RUE vein tenderness Negative: CP, SOB  Physical Exam  BP 134/86 (BP Location: Left Arm)   Pulse 72   Temp 98.6 F (37 C) (Oral)   Resp 16   SpO2 97%  Gen:   Awake, no distress  NAD Resp:  Normal effort CTA MSK:   Moves extremities without difficulty  Other:  CVS: RRR, normal distal pulses  Medical Decision Making  Medically screening exam initiated at 12:57 PM.  Appropriate orders placed.  Brian Matthews was informed that the remainder of the evaluation will be completed by another provider, this initial triage assessment does not replace that evaluation, and the importance of remaining in the ED until their evaluation is complete.  Patient with ED evaluation of RUE phlebitis.    Melvenia Needles, PA-C 03/11/21 1301    Lucrezia Starch, MD 03/11/21 [phone]

## 2021-03-11 NOTE — Discharge Instructions (Signed)
Your exam and [name] confirm a superficial thrombophlebitis to the upper arm. Take OTC ibuprofen or Motrin. Apply warm compresses to reduce pain and inflammation. Follow-up with your PCP for continued symptoms.

## 2021-03-29 NOTE — ED Provider Notes (Signed)
[facility] Emergency Department Provider Note ____________________________________________  Time seen: 1256  I have reviewed the triage vital signs and the nursing notes.  HISTORY  Chief Complaint  Arm Injury   HPI Brian Matthews is a 70 y.o. male presents to the ED for evaluation of right upper extremity superficial vein information.  He reports a visit in July where he had a IV placed in his upper right arm.  Subsequent to that he continued to experience some intermittent vessel tenderness nodularity and voice some concern for blood clot.  When he followed up with the initial provider at [facility], they suggested that he report to the ED for evaluation of his symptoms.  Patient denies any distal paresthesias, chest pain, or shortness of breath.  Past Medical History:  Diagnosis Date   Allergy    Broken wrist    Childhood asthma    History of blood transfusion    Hx of adenomatous polyp of colon 04/29/2017   Hypertension    Hyperthyroidism    Positive TB test     Patient Active Problem List   Diagnosis Date Noted   Sore throat, chronic 02/01/2021   Bruising 02/01/2021   Frequent urination 12/12/2020   Numbness of fingers 12/12/2020   Elevated glucose 12/12/2020   GERD (gastroesophageal reflux disease) 04/20/2020   Subacute thyroiditis 11/18/2016   Benign essential HTN 10/17/2014    Past Surgical History:  Procedure Laterality Date   COLONOSCOPY  04/2017   Dr Carlean Purl    Prior to Admission medications   Medication Sig Start Date End Date Taking? Authorizing Provider  amLODipine (NORVASC) 5 MG tablet TAKE 1 TABLET(5 MG) BY MOUTH DAILY 12/16/20   Dutch Quint B, FNP  Cholecalciferol (VITAMIN D-3 PO) Take 1 capsule by mouth 2 (two) times a week.     [provider]  Cyanocobalamin (VITAMIN B-12 PO) Take 1 tablet by mouth 2 (two) times a week.     [provider]  Misc Natural Products (GLUCOS-CHONDROIT-MSM COMPLEX) TABS Take 2  tablets by mouth daily.    [provider]  Multiple Vitamin (MULTIVITAMIN) tablet Take 1 tablet by mouth daily.    [provider]  Omega-3 Fatty Acids (FISH OIL) 1200 MG CAPS Take 1 capsule by mouth 3 (three) times daily.     [provider]  OVER THE COUNTER MEDICATION Prostate Blend 1000 mg 3  tablets  daily.    [provider]  Saw Palmetto 450 MG CAPS Take 6 capsules by mouth daily.     [provider]  triamterene-hydrochlorothiazide (MAXZIDE-25) 37.5-25 MG tablet TAKE 1 TABLET BY MOUTH DAILY 02/19/21   Michela Pitcher, NP    Allergies Lisinopril and Penicillins  Family History  Problem Relation Age of Onset   Hypertension Mother    Arthritis Father        Rheumatoid Artritis   Heart Problems Father    Colon cancer Sister    Brain cancer Brother    Melanoma Brother    Thyroid disease Neg Hx    Esophageal cancer Neg Hx    Pancreatic cancer Neg Hx    Rectal cancer Neg Hx    Stomach cancer Neg Hx     Social History Social History   Tobacco Use   Smoking status: Never   Smokeless tobacco: Never  Vaping Use   Vaping Use: Never used  Substance Use Topics   Alcohol use: No    Alcohol/week: 0.0 standard drinks   Drug use:  No    Review of Systems  Constitutional: Negative for fever. Eyes: Negative for visual changes. ENT: Negative for sore throat. Cardiovascular: Negative for chest pain. Respiratory: Negative for shortness of breath. Gastrointestinal: Negative for abdominal pain, vomiting and diarrhea. Genitourinary: Negative for dysuria. Musculoskeletal: Negative for back pain. Skin: Negative for rash.  Right upper extremity superficial vessel tenderness as above. Neurological: Negative for headaches, focal weakness or numbness. ____________________________________________  PHYSICAL EXAM:  VITAL SIGNS: ED Triage Vitals  Enc Vitals Group     BP 03/11/21 1238 134/86     Pulse Rate 03/11/21 1238 72     Resp 03/11/21  1238 16     Temp 03/11/21 1238 98.6 F (37 C)     Temp Source 03/11/21 1238 Oral     SpO2 03/11/21 1238 97 %     Weight --      Height --      Head Circumference --      Peak Flow --      Pain Score 03/11/21 1239 0     Pain Loc --      Pain Edu? --      Excl. in [place]? --     Constitutional: Alert and oriented. Well appearing and in no distress. Head: Normocephalic and atraumatic. Eyes: Conjunctivae are normal. Normal extraocular movements Neck: Supple. No thyromegaly. Cardiovascular: Normal rate, regular rhythm. Normal distal pulses.  Right upper extremity with an area to the medial forearm with some mild tenderness across the superficial veins.  No localized erythema or edema is appreciated. Respiratory: Normal respiratory effort. No wheezes/rales/rhonchi. Gastrointestinal: Soft and nontender. No distention. Musculoskeletal: Nontender with normal range of motion in all extremities.  Neurologic:  Normal gait without ataxia. Normal speech and language. No gross focal neurologic deficits are appreciated. Skin:  Skin is warm, dry and intact. No rash noted. ____________________________________________    {LABS (pertinent positives/negatives) Labs Reviewed - No data to display   ____________________________________________  {EKG  ____________________________________________   RADIOLOGY Official radiology report(s):  US Venous RUE  IMPRESSION: Sonographic survey of the right upper extremity negative for DVT.   Positive for superficial thrombophlebitis of the mid segment right cephalic vein.   ____________________________________________  PROCEDURES   Procedures ____________________________________________   INITIAL IMPRESSION / ASSESSMENT AND PLAN / ED COURSE  As part of my medical decision making, I reviewed the following data within the [place] reviewed as noted and Notes from prior ED visits   DDX: DVT, phlebitis,  thrombophlebitis  Patient ED evaluation of several weeks of continued tenderness to palpation over the right upper medial vessels after an IV procedure.  Patient was evaluated in the ED today with ultrasound that day revealed a mid cephalic thrombophlebitis.  Patient otherwise in stable condition and discharged with instructions to follow with primary provider.  He should apply warm compresses to the area, and take anti-inflammatories as needed.  Return precautions of been reviewed.  Brian Matthews was evaluated in Emergency Department on 03/29/2021 for the symptoms described in the history of present illness. He was evaluated in the context of the global COVID-19 pandemic, which necessitated consideration that the patient might be at risk for infection with the SARS-CoV-2 virus that causes COVID-19. Institutional protocols and algorithms that pertain to the evaluation of patients at risk for COVID-19 are in a state of rapid change based on information released by regulatory bodies including the CDC and federal and state organizations. These policies and algorithms were followed during the patient's  care in the ED.  ____________________________________________  FINAL CLINICAL IMPRESSION(S) / ED DIAGNOSES  Final diagnoses:  Phlebitis after infusion, initial encounter      Melvenia Needles, PA-C 03/30/21 2100    Lucrezia Starch, MD 04/04/21 [phone]

## 2021-04-19 ENCOUNTER — Other Ambulatory Visit: Payer: Self-pay

## 2021-04-19 ENCOUNTER — Ambulatory Visit (INDEPENDENT_AMBULATORY_CARE_PROVIDER_SITE_OTHER): Payer: Medicare Other

## 2021-04-19 DIAGNOSIS — Z23 Encounter for immunization: Secondary | ICD-10-CM | POA: Diagnosis not present

## 2021-04-26 ENCOUNTER — Encounter: Payer: Self-pay | Admitting: Nurse Practitioner

## 2021-05-06 ENCOUNTER — Other Ambulatory Visit: Payer: Self-pay

## 2021-05-06 ENCOUNTER — Encounter: Payer: Self-pay | Admitting: Nurse Practitioner

## 2021-05-06 ENCOUNTER — Ambulatory Visit (INDEPENDENT_AMBULATORY_CARE_PROVIDER_SITE_OTHER): Payer: Medicare Other | Admitting: Nurse Practitioner

## 2021-05-06 VITALS — BP 132/78 | HR 71 | Temp 97.8°F | Resp 14 | Ht 71.75 in | Wt 178.1 lb

## 2021-05-06 DIAGNOSIS — Z808 Family history of malignant neoplasm of other organs or systems: Secondary | ICD-10-CM

## 2021-05-06 DIAGNOSIS — I10 Essential (primary) hypertension: Secondary | ICD-10-CM

## 2021-05-06 DIAGNOSIS — Z0000 Encounter for general adult medical examination without abnormal findings: Secondary | ICD-10-CM | POA: Insufficient documentation

## 2021-05-06 DIAGNOSIS — E785 Hyperlipidemia, unspecified: Secondary | ICD-10-CM | POA: Diagnosis not present

## 2021-05-06 DIAGNOSIS — E061 Subacute thyroiditis: Secondary | ICD-10-CM

## 2021-05-06 DIAGNOSIS — Z125 Encounter for screening for malignant neoplasm of prostate: Secondary | ICD-10-CM | POA: Diagnosis not present

## 2021-05-06 LAB — COMPREHENSIVE METABOLIC PANEL
ALT: 17 U/L (ref 0–53)
AST: 19 U/L (ref 0–37)
Albumin: 4.5 g/dL (ref 3.5–5.2)
Alkaline Phosphatase: 61 U/L (ref 39–117)
BUN: 13 mg/dL (ref 6–23)
CO2: 33 mEq/L — ABNORMAL HIGH (ref 19–32)
Calcium: 9.3 mg/dL (ref 8.4–10.5)
Chloride: 101 mEq/L (ref 96–112)
Creatinine, Ser: 1.22 mg/dL (ref 0.40–1.50)
GFR: 60.52 mL/min (ref >60.00)
Glucose, Bld: 88 mg/dL (ref 70–99)
Potassium: 4.4 mEq/L (ref 3.5–5.1)
Sodium: 139 mEq/L (ref 135–145)
Total Bilirubin: 0.8 mg/dL (ref 0.2–1.2)
Total Protein: 6.9 g/dL (ref 6.0–8.3)

## 2021-05-06 LAB — CBC
HCT: 45.4 % (ref 39.0–52.0)
Hemoglobin: 15.4 g/dL (ref 13.0–17.0)
MCHC: 33.8 g/dL (ref 30.0–36.0)
MCV: 89.7 fl (ref 78.0–100.0)
Platelets: 186 10*3/uL (ref 150.0–400.0)
RBC: 5.06 Mil/uL (ref 4.22–5.81)
RDW: 14.1 % (ref 11.5–15.5)
WBC: 3.9 10*3/uL — ABNORMAL LOW (ref 4.0–10.5)

## 2021-05-06 LAB — LIPID PANEL
Cholesterol: 167 mg/dL (ref 0–200)
HDL: 34 mg/dL — ABNORMAL LOW (ref >39.00)
LDL Cholesterol: 112 mg/dL — ABNORMAL HIGH (ref 0–99)
NonHDL: 133.28
Total CHOL/HDL Ratio: 5
Triglycerides: 108 mg/dL (ref 0.0–149.0)
VLDL: 21.6 mg/dL (ref 0.0–40.0)

## 2021-05-06 LAB — PSA, MEDICARE: PSA: 0.49 ng/ml (ref 0.10–4.00)

## 2021-05-06 LAB — TSH: TSH: 3.12 u[IU]/mL (ref 0.35–5.50)

## 2021-05-06 NOTE — Assessment & Plan Note (Signed)
States brother had melanoma as the cause of death.  Has some spots on his skin that look benign we will send him for skin survey.  Referral placed to dermatology

## 2021-05-06 NOTE — Patient Instructions (Signed)
Nice to see you today Will be in touch regarding your labs Follow up with me in a year, sooner if you need something

## 2021-05-06 NOTE — Progress Notes (Signed)
Established Patient Office Visit  Subjective:  Patient ID: Brian Matthews, male    DOB: [birth year]  Age: 70 y.o. MRN: [id number]  CC:  Chief Complaint  Patient presents with   Medicare Wellness    HPI Brian Matthews presents for complete physical and follow up of chronic conditions.  Immunizations: -Tetanus: 12/12/2018 -Influenza: 04/19/2021 -Covid-19: UTD -Shingles: Zosta vax. Will get a pharmacy -Pneumonia: 11/10/2018 and 09/24/2017  -HPV: NA  Diet: Fair diet.  Exercise: Regular exercise at least 4 times weekly from 30-60+ minutes  Eye exam: Completes annually. 11/16/2020. Dr. Jeremy Johann  Dental exam: Completes semi-annually. Dr. Eligha Bridegroom    Dexa: Completed in 09/07/2019 Colonoscopy: Completed in 04/23/2017. Dr [name]. Repeat in 2023 PSA: Ordered today  Lung Cancer Screening: NA    PHQ9 SCORE ONLY 05/06/2021 04/20/2020 11/10/2018  PHQ-9 Total Score 0 0 0     Medicare annual wellness visit  HTN: checks it several times a week. Most numbers are in limits of .  Hearing Screening   500Hz  1000Hz  2000Hz  4000Hz   Right ear 25 25 0 0  Left ear 25 25 25  0  Vision Screening - Comments:: Last eye exam on 11/16/20-[facility]   Past Medical History:  Diagnosis Date   Allergy    Broken wrist    Childhood asthma    History of blood transfusion    Hx of adenomatous polyp of colon 04/29/2017   Hypertension    Hyperthyroidism    Positive TB test     Past Surgical History:  Procedure Laterality Date   COLONOSCOPY  04/2017   Dr Carlean Purl    Family History  Problem Relation Age of Onset   Hypertension Mother    Arthritis Father        Rheumatoid Artritis   Heart Problems Father    Colon cancer Sister    Brain cancer Brother    Melanoma Brother    Thyroid disease Neg Hx    Esophageal cancer Neg Hx    Pancreatic cancer Neg Hx    Rectal cancer Neg Hx    Stomach cancer Neg Hx     Social History   Socioeconomic History   Marital  status: Divorced    Spouse name: Not on file   Number of children: 0   Years of education: Not on file   Highest education level: Not on file  Occupational History   Not on file  Tobacco Use   Smoking status: Never   Smokeless tobacco: Never  Vaping Use   Vaping Use: Never used  Substance and Sexual Activity   Alcohol use: No    Alcohol/week: 0.0 standard drinks   Drug use: No   Sexual activity: Not on file  Other Topics Concern   Not on file  Social History Narrative   Right Handed    Lives in a two story home    Social Determinants of Health   Financial Resource Strain: Not on file  Food Insecurity: Not on file  Transportation Needs: Not on file  Physical Activity: Not on file  Stress: Not on file  Social Connections: Not on file  Intimate Partner Violence: Not on file    Outpatient Medications Prior to Visit  Medication Sig Dispense Refill   amLODipine (NORVASC) 5 MG tablet TAKE 1 TABLET(5 MG) BY MOUTH DAILY 90 tablet 1   Cholecalciferol (VITAMIN D-3 PO) Take 1 capsule by mouth 2 (two) times a week.      Cyanocobalamin (VITAMIN B-12  PO) Take 1 tablet by mouth 2 (two) times a week.      Misc Natural Products (GLUCOS-CHONDROIT-MSM COMPLEX) TABS Take 2 tablets by mouth daily.     Multiple Vitamin (MULTIVITAMIN) tablet Take 1 tablet by mouth daily.     Omega-3 Fatty Acids (FISH OIL) 1200 MG CAPS Take 1 capsule by mouth 3 (three) times daily.      OVER THE COUNTER MEDICATION Prostate Blend 1000 mg 3  tablets  daily.     Saw Palmetto 450 MG CAPS Take 6 capsules by mouth daily.      triamterene-hydrochlorothiazide (MAXZIDE-25) 37.5-25 MG tablet TAKE 1 TABLET BY MOUTH DAILY 90 tablet 3   No facility-administered medications prior to visit.    Allergies  Allergen Reactions   Lisinopril Other (See Comments)    cough   Penicillins Other (See Comments)    Does not remember    ROS Review of Systems  Constitutional:  Negative for chills, fatigue and fever.   Respiratory:  Negative for cough and shortness of breath.   Cardiovascular:  Negative for chest pain.  Gastrointestinal:  Negative for abdominal pain, blood in stool, diarrhea, nausea and vomiting.  Musculoskeletal:  Positive for arthralgias.  Skin:  Positive for rash. Negative for color change.  Neurological:  Negative for weakness and headaches.  Psychiatric/Behavioral:  Negative for suicidal ideas.      Objective:    Physical Exam Vitals and nursing note reviewed.  Constitutional:      Appearance: Normal appearance.  HENT:     Right Ear: Tympanic membrane, ear canal and external ear normal. There is no impacted cerumen.     Left Ear: Tympanic membrane, ear canal and external ear normal. There is no impacted cerumen.     Mouth/Throat:     Mouth: Mucous membranes are moist.     Pharynx: Oropharynx is clear.  Eyes:     Extraocular Movements: Extraocular movements intact.     Pupils: Pupils are equal, round, and reactive to light.     Comments: Wears corrective lenses  Neck:     Thyroid: No thyroid mass, thyromegaly or thyroid tenderness.  Cardiovascular:     Rate and Rhythm: Normal rate and regular rhythm.     Pulses:          Posterior tibial pulses are 1+ on the right side and 1+ on the left side.  Pulmonary:     Effort: Pulmonary effort is normal.     Breath sounds: Normal breath sounds.  Abdominal:     General: Bowel sounds are normal. There is no distension.     Palpations: There is no mass.     Tenderness: There is no abdominal tenderness.  Musculoskeletal:     Right lower leg: No edema.     Left lower leg: No edema.  Lymphadenopathy:     Cervical: No cervical adenopathy.  Skin:      Neurological:     General: No focal deficit present.     Mental Status: He is alert.     Comments: Bilateral upper and lower extremity strength 5/5  Psychiatric:        Mood and Affect: Mood normal.        Behavior: Behavior normal.        Thought Content: Thought content  normal.        Judgment: Judgment normal.    There were no vitals taken for this visit. Wt Readings from Last 3 Encounters:  02/21/21 175 lb (79.4  kg)  02/01/21 175 lb 8 oz (79.6 kg)  12/25/20 178 lb 2.1 oz (80.8 kg)     Health Maintenance Due  Topic Date Due   Zoster Vaccines- Shingrix (1 of 2) Never done   TETANUS/TDAP  12/12/2018   COVID-19 Vaccine (4 - Booster for Moderna series) 02/16/2021    There are no preventive care reminders to display for this patient.  Lab Results  Component Value Date   TSH 2.55 04/20/2020   Lab Results  Component Value Date   WBC 4.6 12/25/2020   HGB 15.5 12/25/2020   HCT 44.8 12/25/2020   MCV 88.2 12/25/2020   PLT 152 12/25/2020   Lab Results  Component Value Date   NA 139 12/25/2020   K 5.0 12/25/2020   CO2 30 12/25/2020   GLUCOSE 105 (H) 12/25/2020   BUN 14 12/25/2020   CREATININE 1.24 12/25/2020   BILITOT 0.8 04/20/2020   ALKPHOS 55 11/10/2018   AST 22 04/20/2020   ALT 16 04/20/2020   PROT 7.0 04/20/2020   ALBUMIN 4.4 11/10/2018   CALCIUM 9.2 12/25/2020   ANIONGAP 7 12/25/2020   GFR 83.42 11/10/2018   Lab Results  Component Value Date   CHOL 167 04/20/2020   Lab Results  Component Value Date   HDL 35 (L) 04/20/2020   Lab Results  Component Value Date   LDLCALC 109 (H) 04/20/2020   Lab Results  Component Value Date   TRIG 115 04/20/2020   Lab Results  Component Value Date   CHOLHDL 4.8 04/20/2020   Lab Results  Component Value Date   HGBA1C 5.4 12/12/2020      Assessment & Plan:   Problem List Items Addressed This Visit       Cardiovascular and Mediastinum   Benign essential HTN    Blood pressure in office continue taking Maxide and amlodipine.  Continue taking blood pressures      Relevant Orders   CBC   Comprehensive metabolic panel     Endocrine   Subacute thyroiditis    TSH in office today pending results.      Relevant Orders   TSH     Other   Medicare annual wellness visit,  initial - Primary    Discussed age-appropriate immunizations and screenings.  Healthy lifestyle choices including watching diet and exercising as noted.  Did review paperwork packet with patient signed and sent off to be scanned.  Pending lab results      Hyperlipidemia    Apparent last several labs.  Slightly elevated LDL recheck in office.      Relevant Orders   Lipid panel   Family history of melanoma    States brother had melanoma as the cause of death.  Has some spots on his skin that look benign we will send him for skin survey.  Referral placed to dermatology      Relevant Orders   Ambulatory referral to Dermatology   Other Visit Diagnoses     Screening for prostate cancer       Relevant Orders   PSA, Medicare       No orders of the defined types were placed in this encounter.   Follow-up: Return in about 1 year (around 05/06/2022) for AWV and labs.   This visit occurred during the SARS-CoV-2 public health emergency.  Safety protocols were in place, including screening questions prior to the visit, additional usage of staff PPE, and extensive cleaning of exam room while observing appropriate contact time as  indicated for disinfecting solutions.    Romilda Garret, NP

## 2021-05-06 NOTE — Assessment & Plan Note (Addendum)
Apparent last several labs.  Slightly elevated LDL recheck in office.

## 2021-05-06 NOTE — Assessment & Plan Note (Signed)
Blood pressure in office continue taking Maxide and amlodipine.  Continue taking blood pressures

## 2021-05-06 NOTE — Assessment & Plan Note (Signed)
TSH in office today pending results.

## 2021-05-06 NOTE — Assessment & Plan Note (Signed)
Discussed age-appropriate immunizations and screenings.  Healthy lifestyle choices including watching diet and exercising as noted.  Did review paperwork packet with patient signed and sent off to be scanned.  Pending lab results

## 2021-05-07 ENCOUNTER — Encounter: Payer: Self-pay | Admitting: Nurse Practitioner

## 2021-05-08 NOTE — Telephone Encounter (Signed)
I spoke with pt; pt started with symptoms chest pain on rt and left side of chest that went under arm. Pt exercising 4 - 5 times a wk for past 50 yrs. Pt had SOB with exertion and pt cannot remember if had SOB when resting. Pt had flutter feeling in chest. Pt has had flutter feeling on and off but cannot remember how long has had that symptom. Pt said that the difference was this time that pt could hear the fluttering in his ears. Pt had lightheadedness and felt like had a fullness in his throat. Pt said that the symptoms above lasted 15 mins or so. No problems this morning. Pt saw commercial on TV about afib symptoms and wondered if that might be what was going. Pt said he was in no distress now and did not feel he needed immediate eval. Offered pt an appt with Romilda Garret NP on 05/13/21 with UC & ED precautions in the meantime. Pt said he just wanted advise over the phone and did not want to schedule an appt. I advised pt that I would send a note to Romilda Garret NP for his advice and pt request cb after Catalina Antigua reviews this note. Sending note to Romilda Garret NP and Anastasiya CMA and will teams Anastasiya as well.

## 2021-05-22 ENCOUNTER — Other Ambulatory Visit: Payer: Self-pay | Admitting: Nurse Practitioner

## 2021-05-22 DIAGNOSIS — I10 Essential (primary) hypertension: Secondary | ICD-10-CM

## 2021-06-03 ENCOUNTER — Encounter: Payer: Self-pay | Admitting: Nurse Practitioner

## 2021-06-03 ENCOUNTER — Ambulatory Visit (INDEPENDENT_AMBULATORY_CARE_PROVIDER_SITE_OTHER): Payer: Medicare Other | Admitting: Nurse Practitioner

## 2021-06-03 ENCOUNTER — Other Ambulatory Visit: Payer: Self-pay

## 2021-06-03 ENCOUNTER — Ambulatory Visit (INDEPENDENT_AMBULATORY_CARE_PROVIDER_SITE_OTHER)
Admission: RE | Admit: 2021-06-03 | Discharge: 2021-06-03 | Disposition: A | Discharge status: Home / Self Care | Payer: Medicare Other | Source: Ambulatory Visit | Attending: Nurse Practitioner | Admitting: Nurse Practitioner

## 2021-06-03 VITALS — BP 130/88 | HR 79 | Temp 98.6°F | Ht 71.0 in | Wt 179.0 lb

## 2021-06-03 DIAGNOSIS — M545 Low back pain, unspecified: Secondary | ICD-10-CM

## 2021-06-03 DIAGNOSIS — S39012A Strain of muscle, fascia and tendon of lower back, initial encounter: Secondary | ICD-10-CM

## 2021-06-03 MED ORDER — PREDNISONE 20 MG PO TABS: ORAL_TABLET | ORAL | 0 refills | Status: AC

## 2021-06-03 MED ORDER — CYCLOBENZAPRINE HCL 5 MG PO TABS: 5.0000 mg | ORAL_TABLET | Freq: Two times a day (BID) | ORAL | 0 refills | Status: AC | PRN

## 2021-06-03 NOTE — Assessment & Plan Note (Signed)
Will prescribe muscle relaxant.  Did discuss sedation precautions in regard to Flexeril.  If no improvement notify office via [place].  Pending x-ray result

## 2021-06-03 NOTE — Telephone Encounter (Signed)
Spoke to pt. Made appt for this afternoon with [facility].

## 2021-06-03 NOTE — Assessment & Plan Note (Signed)
Occurred after doing exercises.  We will obtain x-ray pending lab results.  Start prednisone and muscle relaxant

## 2021-06-03 NOTE — Progress Notes (Signed)
Acute Office Visit  Subjective:    Patient ID: Brian Matthews, male    DOB: 10-Dec-1950, 70 y.o.   MRN: [id number]  Chief Complaint  Patient presents with   Back Pain    X 1 week across back into left >Right groin. Started after exercise. Increased pain when going for sitting to standing or getting in and out of car.     Back Pain  Patient is in today for Back pain  States started last Sunday or Monday. States that he does a set of exercises base on sets from [employer]. Did a set of exercise that he has not done in a month and a half. States that the next morning he is having trouble getting out of the bed.  Movement makes it worse Has used lidocaine patches and did not work Has tried advil, motrin, and back pills, without relief   Had a day with some relief then came back without new injury. States he waited a day and did the exercise and caused the same pain   Past Medical History:  Diagnosis Date   Allergy    Broken wrist    Childhood asthma    History of blood transfusion    Hx of adenomatous polyp of colon 04/29/2017   Hypertension    Hyperthyroidism    Positive TB test     Past Surgical History:  Procedure Laterality Date   COLONOSCOPY  04/2017   Dr Carlean Purl    Family History  Problem Relation Age of Onset   Hypertension Mother    Arthritis Father        Rheumatoid Artritis   Heart Problems Father    Colon cancer Sister    Brain cancer Brother    Melanoma Brother    Thyroid disease Neg Hx    Esophageal cancer Neg Hx    Pancreatic cancer Neg Hx    Rectal cancer Neg Hx    Stomach cancer Neg Hx     Social History   Socioeconomic History   Marital status: Divorced    Spouse name: Not on file   Number of children: 0   Years of education: Not on file   Highest education level: Not on file  Occupational History   Not on file  Tobacco Use   Smoking status: Never   Smokeless tobacco: Never  Vaping Use   Vaping Use: Never used  Substance and  Sexual Activity   Alcohol use: No    Alcohol/week: 0.0 standard drinks   Drug use: No   Sexual activity: Not on file  Other Topics Concern   Not on file  Social History Narrative   Right Handed    Lives in a two story home    Social Determinants of Health   Financial Resource Strain: Not on file  Food Insecurity: Not on file  Transportation Needs: Not on file  Physical Activity: Not on file  Stress: Not on file  Social Connections: Not on file  Intimate Partner Violence: Not on file    Outpatient Medications Prior to Visit  Medication Sig Dispense Refill   amLODipine (NORVASC) 5 MG tablet TAKE 1 TABLET(5 MG) BY MOUTH DAILY 90 tablet 1   Cholecalciferol (VITAMIN D-3 PO) Take 1 capsule by mouth 2 (two) times a week.      Cyanocobalamin (VITAMIN B-12 PO) Take 1 tablet by mouth 2 (two) times a week.      Misc Natural Products (GLUCOS-CHONDROIT-MSM COMPLEX) TABS Take 2 tablets by  mouth daily.     Multiple Vitamin (MULTIVITAMIN) tablet Take 1 tablet by mouth daily.     Omega-3 Fatty Acids (FISH OIL) 1200 MG CAPS Take 1 capsule by mouth 3 (three) times daily.      OVER THE COUNTER MEDICATION Prostate Blend 1000 mg 3  tablets  daily.     Saw Palmetto 450 MG CAPS Take 6 capsules by mouth daily.      triamterene-hydrochlorothiazide (MAXZIDE-25) 37.5-25 MG tablet TAKE 1 TABLET BY MOUTH DAILY 90 tablet 3   No facility-administered medications prior to visit.    Allergies  Allergen Reactions   Lisinopril Other (See Comments)    cough   Penicillins Other (See Comments)    Does not remember    Review of Systems  Musculoskeletal:  Positive for back pain.      Objective:    Physical Exam Vitals and nursing note reviewed.  Constitutional:      Appearance: Normal appearance.  Cardiovascular:     Rate and Rhythm: Normal rate and regular rhythm.     Pulses: Normal pulses.  Pulmonary:     Effort: Pulmonary effort is normal.     Breath sounds: Normal breath sounds.  Abdominal:      General: Bowel sounds are normal.  Musculoskeletal:     Lumbar back: Spasms present. No tenderness or bony tenderness. Decreased range of motion. Negative right straight leg raise test and negative left straight leg raise test.       Back:     Right hip: No tenderness or bony tenderness. Normal range of motion. Normal strength.     Left hip: No tenderness or bony tenderness. Normal range of motion. Normal strength.     Right lower leg: No edema.     Left lower leg: No edema.     Comments: Increase pain with left AROM of hip and leg. No pain with PROM of hip and leg.  Difficulty getting up from sitting to standing position. Laying to sitting position   Skin:    General: Skin is warm.  Neurological:     Mental Status: He is alert.     Sensory: No sensory deficit.     Motor: No weakness.     Coordination: Coordination normal.     Gait: Gait abnormal.     Deep Tendon Reflexes: Reflexes normal.     Reflex Scores:      Patellar reflexes are 2+ on the right side and 2+ on the left side. Psychiatric:        Mood and Affect: Mood normal.        Behavior: Behavior normal.        Thought Content: Thought content normal.        Judgment: Judgment normal.    BP 130/88    Pulse 79    Temp 98.6 F (37 C) (Temporal)    Ht 5\' 11"  (1.803 m)    Wt 179 lb (81.2 kg)    SpO2 97%    BMI 24.97 kg/m  Wt Readings from Last 3 Encounters:  06/03/21 179 lb (81.2 kg)  05/06/21 178 lb 2 oz (80.8 kg)  02/21/21 175 lb (79.4 kg)    Health Maintenance Due  Topic Date Due   Zoster Vaccines- Shingrix (1 of 2) Never done   TETANUS/TDAP  12/12/2018   COVID-19 Vaccine (4 - Booster for Moderna series) 02/16/2021    There are no preventive care reminders to display for this patient.   Lab Results  Component Value Date   TSH 3.12 05/06/2021   Lab Results  Component Value Date   WBC 3.9 (L) 05/06/2021   HGB 15.4 05/06/2021   HCT 45.4 05/06/2021   MCV 89.7 05/06/2021   PLT 186.0 05/06/2021    Lab Results  Component Value Date   NA 139 05/06/2021   K 4.4 05/06/2021   CO2 33 (H) 05/06/2021   GLUCOSE 88 05/06/2021   BUN 13 05/06/2021   CREATININE 1.22 05/06/2021   BILITOT 0.8 05/06/2021   ALKPHOS 61 05/06/2021   AST 19 05/06/2021   ALT 17 05/06/2021   PROT 6.9 05/06/2021   ALBUMIN 4.5 05/06/2021   CALCIUM 9.3 05/06/2021   ANIONGAP 7 12/25/2020   GFR 60.52 05/06/2021   Lab Results  Component Value Date   CHOL 167 05/06/2021   Lab Results  Component Value Date   HDL 34.00 (L) 05/06/2021   Lab Results  Component Value Date   LDLCALC 112 (H) 05/06/2021   Lab Results  Component Value Date   TRIG 108.0 05/06/2021   Lab Results  Component Value Date   CHOLHDL 5 05/06/2021   Lab Results  Component Value Date   HGBA1C 5.4 12/12/2020       Assessment & Plan:   Problem List Items Addressed This Visit       Musculoskeletal and Integument   Strain of lumbar region - Primary    Will prescribe muscle relaxant.  Did discuss sedation precautions in regard to Flexeril.  If no improvement notify office via [place].  Pending x-ray result      Relevant Medications   cyclobenzaprine (FLEXERIL) 5 MG tablet   Other Relevant Orders   DG Lumbar Spine Complete     Other   Lumbar back pain    Occurred after doing exercises.  We will obtain x-ray pending lab results.  Start prednisone and muscle relaxant      Relevant Medications   cyclobenzaprine (FLEXERIL) 5 MG tablet   predniSONE (DELTASONE) 20 MG tablet   Other Relevant Orders   DG Lumbar Spine Complete     No orders of the defined types were placed in this encounter.  This visit occurred during the SARS-CoV-2 public health emergency.  Safety protocols were in place, including screening questions prior to the visit, additional usage of staff PPE, and extensive cleaning of exam room while observing appropriate contact time as indicated for disinfecting solutions.   Romilda Garret, NP

## 2021-06-03 NOTE — Patient Instructions (Signed)
Nice to see you today If you do not improve in a week, reach out to me. I will be in touch with the xray results

## 2021-06-14 ENCOUNTER — Encounter: Payer: Self-pay | Admitting: Nurse Practitioner

## 2021-08-05 ENCOUNTER — Encounter: Payer: Self-pay | Admitting: Nurse Practitioner

## 2021-08-07 ENCOUNTER — Encounter: Payer: Self-pay | Admitting: Nurse Practitioner

## 2021-09-06 ENCOUNTER — Encounter: Payer: Self-pay | Admitting: Nurse Practitioner

## 2021-09-07 IMAGING — CR DG CHEST 2V
2 series · 2 of 2 positions shown · non-contrast
Comparison: 11/10/2018

CLINICAL DATA: Chest pain and shortness of breath.

EXAM:
CHEST - 2 VIEW

[chest pa]
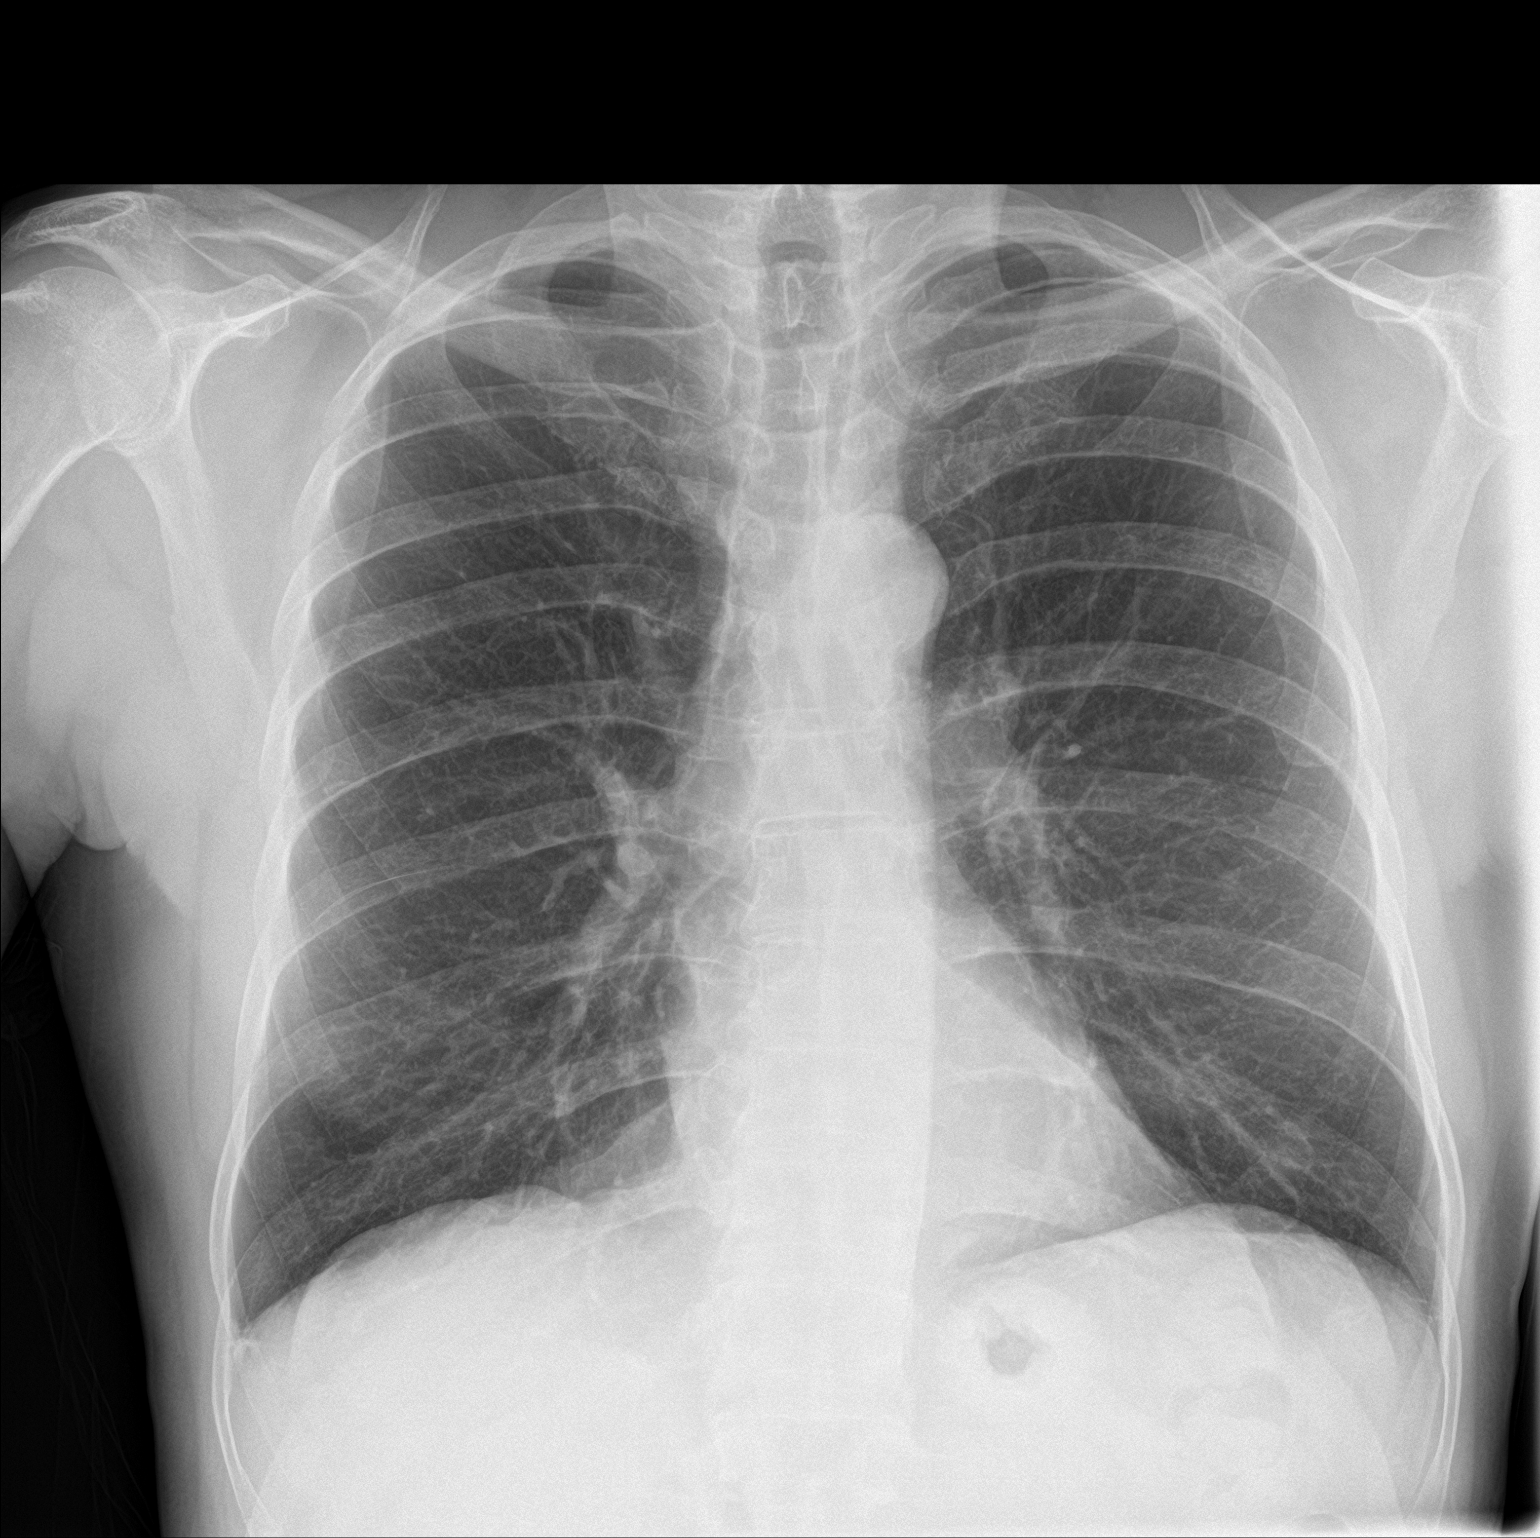

[chest lat]
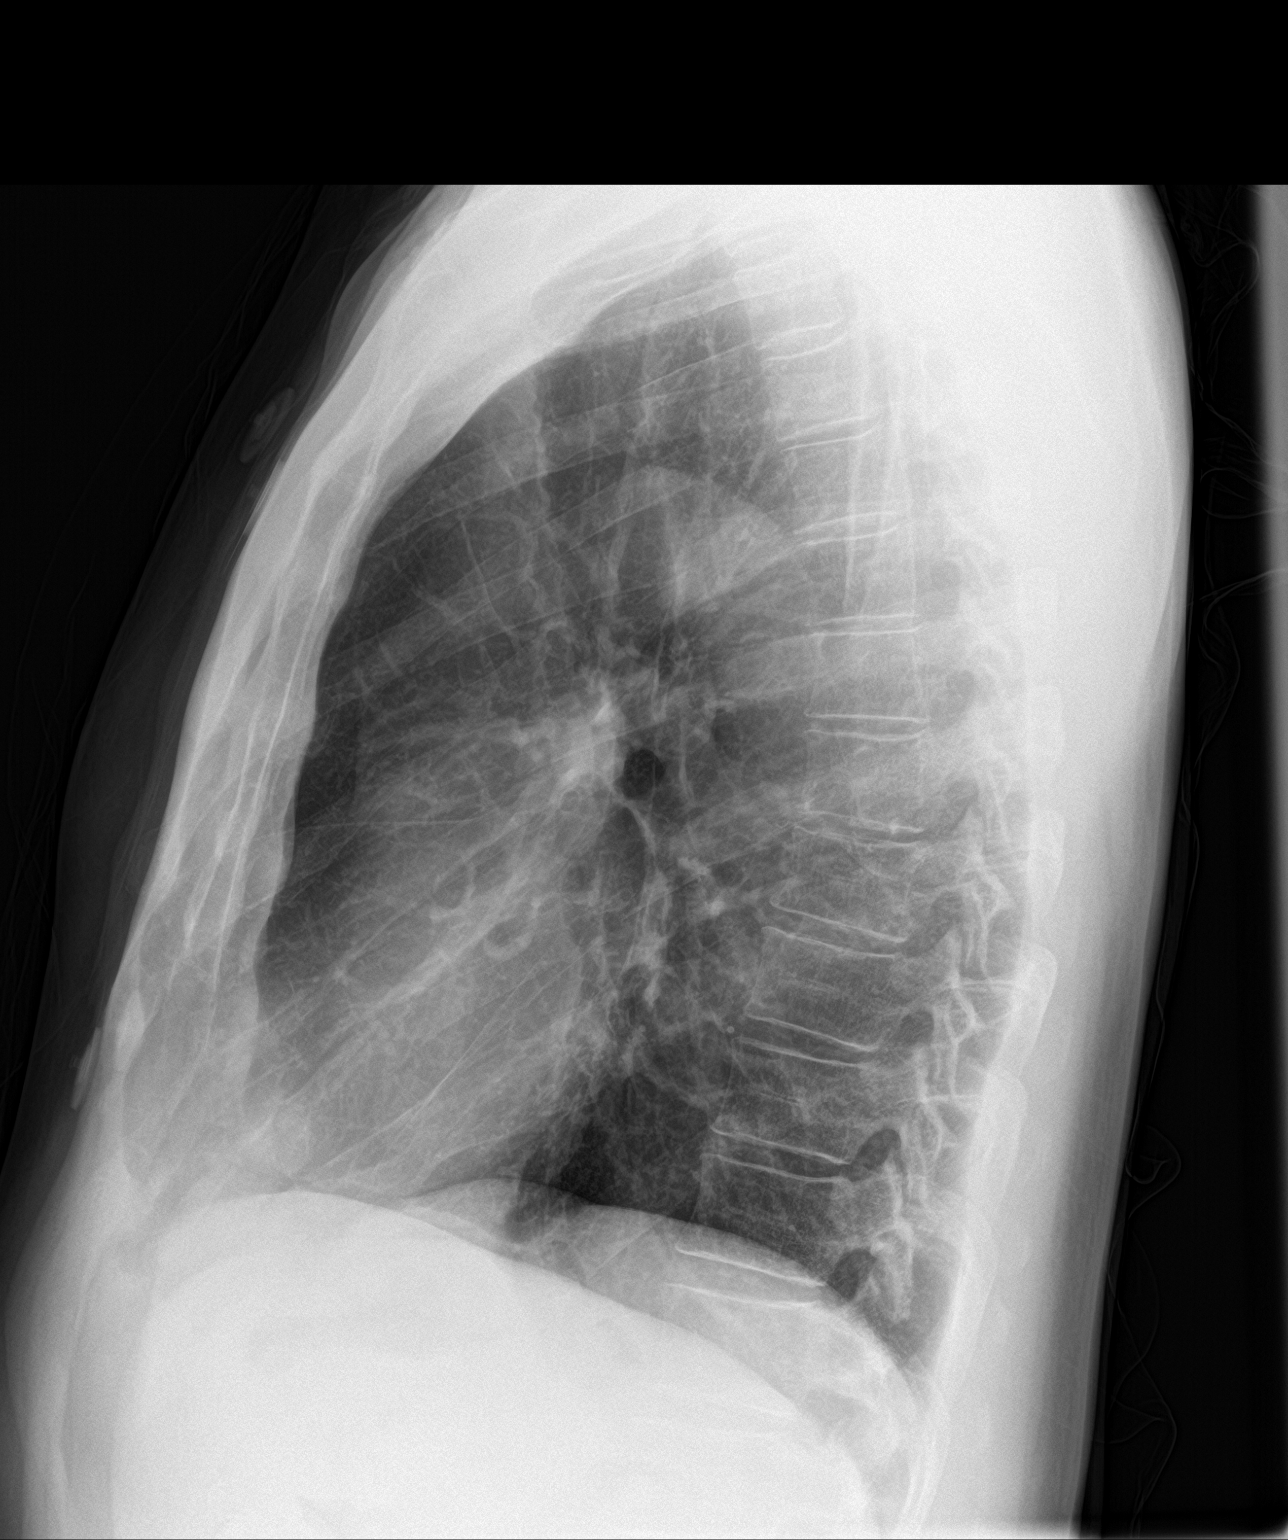

[2 of 2 positions shown; findings below may reference images not displayed]

FINDINGS: The lungs are clear without focal pneumonia, edema, pneumothorax or
pleural effusion. The cardiopericardial silhouette is within normal
limits for size. The visualized bony structures of the thorax are
intact.
IMPRESSION: Stable.  No acute findings.

## 2021-09-13 ENCOUNTER — Encounter: Payer: Self-pay | Admitting: Nurse Practitioner

## 2021-09-19 DIAGNOSIS — E569 Vitamin deficiency, unspecified: Secondary | ICD-10-CM | POA: Insufficient documentation

## 2021-09-19 DIAGNOSIS — K250 Acute gastric ulcer with hemorrhage: Secondary | ICD-10-CM | POA: Insufficient documentation

## 2021-09-23 ENCOUNTER — Ambulatory Visit (INDEPENDENT_AMBULATORY_CARE_PROVIDER_SITE_OTHER): Payer: Medicare Other | Admitting: Nurse Practitioner

## 2021-09-23 ENCOUNTER — Encounter: Payer: Self-pay | Admitting: Nurse Practitioner

## 2021-09-23 ENCOUNTER — Other Ambulatory Visit (INDEPENDENT_AMBULATORY_CARE_PROVIDER_SITE_OTHER): Payer: Medicare Other

## 2021-09-23 VITALS — BP 114/72 | HR 76 | Ht 72.0 in | Wt 170.4 lb

## 2021-09-23 DIAGNOSIS — R1084 Generalized abdominal pain: Secondary | ICD-10-CM | POA: Diagnosis not present

## 2021-09-23 DIAGNOSIS — R194 Change in bowel habit: Secondary | ICD-10-CM | POA: Diagnosis not present

## 2021-09-23 LAB — CBC WITH DIFFERENTIAL/PLATELET
Basophils Absolute: 0 10*3/uL (ref 0.0–0.1)
Basophils Relative: 0.6 % (ref 0.0–3.0)
Eosinophils Absolute: 0.1 10*3/uL (ref 0.0–0.7)
Eosinophils Relative: 2.5 % (ref 0.0–5.0)
HCT: 47.7 % (ref 39.0–52.0)
Hemoglobin: 16.5 g/dL (ref 13.0–17.0)
Lymphocytes Relative: 22.2 % (ref 12.0–46.0)
Lymphs Abs: 0.9 10*3/uL (ref 0.7–4.0)
MCHC: 34.7 g/dL (ref 30.0–36.0)
MCV: 89.7 fl (ref 78.0–100.0)
Monocytes Absolute: 0.4 10*3/uL (ref 0.1–1.0)
Monocytes Relative: 10.5 % (ref 3.0–12.0)
Neutro Abs: 2.5 10*3/uL (ref 1.4–7.7)
Neutrophils Relative %: 64.2 % (ref 43.0–77.0)
Platelets: 181 10*3/uL (ref 150.0–400.0)
RBC: 5.32 Mil/uL (ref 4.22–5.81)
RDW: 13.5 % (ref 11.5–15.5)
WBC: 4 10*3/uL (ref 4.0–10.5)

## 2021-09-23 LAB — COMPREHENSIVE METABOLIC PANEL
ALT: 19 U/L (ref 0–53)
AST: 21 U/L (ref 0–37)
Albumin: 4.7 g/dL (ref 3.5–5.2)
Alkaline Phosphatase: 56 U/L (ref 39–117)
BUN: 14 mg/dL (ref 6–23)
CO2: 30 mEq/L (ref 19–32)
Calcium: 9.9 mg/dL (ref 8.4–10.5)
Chloride: 99 mEq/L (ref 96–112)
Creatinine, Ser: 1.32 mg/dL (ref 0.40–1.50)
GFR: 54.91 mL/min — ABNORMAL LOW (ref >60.00)
Glucose, Bld: 91 mg/dL (ref 70–99)
Potassium: 4.3 mEq/L (ref 3.5–5.1)
Sodium: 139 mEq/L (ref 135–145)
Total Bilirubin: 1.2 mg/dL (ref 0.2–1.2)
Total Protein: 7.3 g/dL (ref 6.0–8.3)

## 2021-09-23 LAB — LIPASE: Lipase: 30 U/L (ref 11.0–59.0)

## 2021-09-23 NOTE — Progress Notes (Signed)
? ? ?Assessment  ? ?Patient profile:  ?Brian Matthews is a 71 y.o. male known to Dr. Carlean Purl ( last seen 2019). Past medical history significant for colon polyps ( small adenoma in 2018), remote PUD and HTN. See PMH below for any additional history.  ? ?Generalized abdominal discomfort, mainly upper, increased frequency of otherwise normal BMs. Symptoms started several weeks ago and are improving. He thinks that discontinuation of some of his supplements including  zinc, collagen and alpha lipoic acid may have helped. Cause of symptoms unclear. He hasn't had any concerning weight loss. Abdominal exam is reassuring.  ? ?History of colon polyp ( small colon adenoma in November 2018) and [facility] of colon cancer in sister at age 37. A 5 year interval colonoscopy recommended in Nov 2023 ? ?Liver hemangiomas, suggested on [name] in December 2021 ? ?Plan  ? ?Obtain CBC, CMET,  lipase ?Will contact him with lab results and to get a condition update. Further evaluation will depend on whether he continues to improve and / or what the labs show.  ?5 year surveillance colonoscopy recommended November 2023.  ? ?History of Present Illness  ? ?Chief complaint:  recent bowel changes, abdominal discomfort.  ? ?Oct 2019 office visit ?Seen for constipation and rectal burning. Symptoms had already improved by time of visit.  ? ?Interval History :  ?In early Feb 2023 Ray starting having generalized, achy abdominal pain and increased frequency of solid stool. The achy abdominal pain sometimes occurs in LUQ,  other times in left mid abdomen but sometimes he has it in the RUQ too. The pain is not related to eating. It is not related to position or physical activity and it doesn't improve with bowel movements. Episodes of abdominal discomfort last a few minutes at the time then resolve spontaneously.  No urinary symptoms, no nausea / vomiting.  Regarding the increased frequency of otherwise normal bowel movements, he had not changed his diet or  medications. He had started a zinc supplement . He was also getting zinc in his multivitamin and in a prostate supplement. He read that excessive intake of zinc could have some of these GI side effects. He stopped the zinc supplement as well as some other supplements including collagen, and alpha lipoic acid. Since then the frequency of bowel movements has improved. Now having only two BMs a day . The abdominal discomfort hasn't resolved but it has improved occurring less often and with less intensity.  The increased frequency of bowel movements led to some minor perianal discomfort and external bleeding.  He is positive that the minor bleeding has been external.   ? ?Ray offers that he had been having groin pain as well. Saw Urology late March. Told he may have pelvic floor weakening. No scans done, pain resolved after starting pelvic floor exercises.  ? ?Ray gives a history of a bleeding gastric ulcer related to motrin in the 1990's. His current abdominal pain doesn't  feel like PUD and he doesn't take NSAID.  He thinks he may have been told he has IBS at one point. He thinks he may have also been told by Pulmonary that he could have GERD. Currently he oesn't have any reflux symptoms.  ? ? ?Previous Labs / Imaging:: ? ?  Latest Ref Rng & Units 05/06/2021  ? 11:15 AM 12/25/2020  ?  3:50 PM 04/20/2020  ?  2:59 PM  ?CBC  ?WBC 4.0 - 10.5 K/uL 3.9   4.6   4.3    ?  Hemoglobin 13.0 - 17.0 g/dL 15.4   15.5   16.3    ?Hematocrit 39.0 - 52.0 % 45.4   44.8   47.5    ?Platelets 150.0 - 400.0 K/uL 186.0   152   192    ? ? ?Lab Results  ?Component Value Date  ? LIPASE 42.0 05/31/2020  ? ? ?  Latest Ref Rng & Units 05/06/2021  ? 11:15 AM 12/25/2020  ?  3:50 PM 04/20/2020  ?  2:59 PM  ?CMP  ?Glucose 70 - 99 mg/dL 88   105   87    ?BUN 6 - 23 mg/dL '13   14   14    '$ ?Creatinine 0.40 - 1.50 mg/dL 1.22   1.24   1.13    ?Sodium 135 - 145 mEq/L 139   139   141    ?Potassium 3.5 - 5.1 mEq/L 4.4   5.0   4.7    ?Chloride 96 - 112 mEq/L 101    102   101    ?CO2 19 - 32 mEq/L 33   30   24    ?Calcium 8.4 - 10.5 mg/dL 9.3   9.2   10.0    ?Total Protein 6.0 - 8.3 g/dL 6.9    7.0    ?Total Bilirubin 0.2 - 1.2 mg/dL 0.8    0.8    ?Alkaline Phos 39 - 117 U/L 61      ?AST 0 - 37 U/L 19    22    ?ALT 0 - 53 U/L 17    16    ? ?November 2022 ?TSH 2.55 ? ? ?Previous GI Evaluations  ? ?Endoscopies: ?November 2018 colonoscopy  ?--2 diminutive transverse polyps ?Path - adenoma and mucosal polyp ? ? ?Past Medical History:  ?Diagnosis Date  ?? Allergy   ?? Broken wrist   ?? Childhood asthma   ?? History of blood transfusion   ?? Hx of adenomatous polyp of colon 04/29/2017  ?? Hypertension   ?? Hyperthyroidism   ?? Positive TB test   ? ?Past Surgical History:  ?Procedure Laterality Date  ?? COLONOSCOPY  04/2017  ? Dr Carlean Purl  ? ?Family History  ?Problem Relation Age of Onset  ?? Hypertension Mother   ?? Arthritis Father   ?     Rheumatoid Artritis  ?? Heart Problems Father   ?? Colon cancer Sister   ?? Brain cancer Brother   ?? Melanoma Brother   ?? Thyroid disease Neg Hx   ?? Esophageal cancer Neg Hx   ?? Pancreatic cancer Neg Hx   ?? Rectal cancer Neg Hx   ?? Stomach cancer Neg Hx   ? ?Social History  ? ?Tobacco Use  ?? Smoking status: Never  ?? Smokeless tobacco: Never  ?Vaping Use  ?? Vaping Use: Never used  ?Substance Use Topics  ?? Alcohol use: No  ?  Alcohol/week: 0.0 standard drinks  ?? Drug use: No  ? ?Current Outpatient Medications  ?Medication Sig Dispense Refill  ?? amLODipine (NORVASC) 5 MG tablet TAKE 1 TABLET(5 MG) BY MOUTH DAILY 90 tablet 1  ?? Cholecalciferol (VITAMIN D-3 PO) Take 1 capsule by mouth 2 (two) times a week.     ?? Cyanocobalamin (VITAMIN B-12 PO) Take 1 tablet by mouth 2 (two) times a week.     ?? Misc Natural Products (GLUCOS-CHONDROIT-MSM COMPLEX) TABS Take 2 tablets by mouth daily.    ?? Multiple Vitamin (MULTIVITAMIN) tablet Take 1 tablet by mouth daily.    ??  Omega-3 Fatty Acids (FISH OIL) 1200 MG CAPS Take 1 capsule by mouth 3 (three)  times daily.     ?? OVER THE COUNTER MEDICATION Prostate Blend 1000 mg 3 ? tablets ? daily.    ?? Saw Palmetto 450 MG CAPS Take 6 capsules by mouth daily.     ?? triamterene-hydrochlorothiazide (MAXZIDE-25) 37.5-25 MG tablet TAKE 1 TABLET BY MOUTH DAILY 90 tablet 3  ? ?No current facility-administered medications for this visit.  ? ? ? ?Review of Systems: ?Positive for allergy, sinus trouble, mild cough, mild shortness of breath, mild occasional sore throat.  All other systems reviewed and negative except where noted in HPI.  ? ?Physical Exam  ? ?Wt Readings from Last 3 Encounters:  ?06/03/21 179 lb (81.2 kg)  ?05/06/21 178 lb 2 oz (80.8 kg)  ?02/21/21 175 lb (79.4 kg)  ? ? ?BP 114/72   Pulse 76   Ht 6' (1.829 m)   Wt 170 lb 6.4 oz (77.3 kg)   SpO2 96%   BMI 23.11 kg/m?  ?Constitutional:  Generally well appearing male in no acute distress. ?Psychiatric: Pleasant. Normal mood and affect. Behavior is normal. ?EENT: Pupils normal.  Conjunctivae are normal. No scleral icterus. ?Neck supple.  ?Cardiovascular: Normal rate, regular rhythm. No edema ?Pulmonary/chest: Effort normal and breath sounds normal. No wheezing, rales or rhonchi. ?Abdominal: Soft, nondistended, nontender. Bowel sounds active throughout. There are no masses palpable. No hepatomegaly. ?Neurological: Alert and oriented to person place and time. ?Skin: Skin is warm and dry. No rashes noted. ? ?Tye Savoy, NP  09/23/2021, 11:18 AM ? ? ? ? ? ? ? ? ? ?

## 2021-09-23 NOTE — Patient Instructions (Addendum)
Please proceed to the basement level for lab work before leaving today. Press "B" on the elevator. The lab is located at the first door on the left as you exit the elevator. ? ?HEALTHCARE LAWS AND MY CHART RESULTS:  ? ?Due to recent changes in healthcare laws, you may see results of your imaging and/or laboratory studies on MyChart before I have had a chance to review them.  I understand that in some cases there may be results that are confusing or concerning to you. Please understand that not all results are received at the same time and often I may need to interpret multiple results in order to provide you with the best plan of care or course of treatment. Therefore, I ask that you please give me 48 hours to thoroughly review all your results before contacting my office for clarification.  ? ?Thank you for trusting me with your gastrointestinal care!   ? ?Tye Savoy, NP ? ? ? ? ?BMI: ? ?If you are age 37 or older, your body mass index should be between 23-30. Your Body mass index is 23.11 kg/m?Marland Kitchen If this is out of the aforementioned range listed, please consider follow up with your Primary Care Provider. ? ?If you are age 54 or younger, your body mass index should be between 19-25. Your Body mass index is 23.11 kg/m?Marland Kitchen If this is out of the aformentioned range listed, please consider follow up with your Primary Care Provider.  ? ?MY CHART: ? ?The [facility] providers would like to encourage you to use [facility] to communicate with providers for non-urgent requests or questions.  Due to long hold times on the telephone, sending your provider a message by [facility] may be a faster and more efficient way to get a response.  Please allow 48 business hours for a response.  Please remember that this is for non-urgent requests.  ? ?

## 2021-09-25 ENCOUNTER — Encounter: Payer: Self-pay | Admitting: Nurse Practitioner

## 2021-09-25 DIAGNOSIS — R944 Abnormal results of kidney function studies: Secondary | ICD-10-CM

## 2021-09-27 NOTE — Telephone Encounter (Signed)
Can we get a lab visit set up in approx a week or so please. ?

## 2021-10-01 DIAGNOSIS — R202 Paresthesia of skin: Secondary | ICD-10-CM | POA: Insufficient documentation

## 2021-10-01 DIAGNOSIS — I10 Essential (primary) hypertension: Secondary | ICD-10-CM | POA: Insufficient documentation

## 2021-10-01 DIAGNOSIS — Z79899 Other long term (current) drug therapy: Secondary | ICD-10-CM | POA: Diagnosis not present

## 2021-10-01 DIAGNOSIS — Z20822 Contact with and (suspected) exposure to covid-19: Secondary | ICD-10-CM | POA: Diagnosis not present

## 2021-10-02 ENCOUNTER — Encounter (HOSPITAL_COMMUNITY): Payer: Self-pay

## 2021-10-02 ENCOUNTER — Emergency Department (HOSPITAL_COMMUNITY)
Admission: EM | Admit: 2021-10-02 | Discharge: 2021-10-02 | Disposition: A | Discharge status: Home / Self Care | Payer: Medicare Other | Attending: Emergency Medicine | Admitting: Emergency Medicine

## 2021-10-02 ENCOUNTER — Emergency Department (HOSPITAL_COMMUNITY): Payer: Medicare Other

## 2021-10-02 ENCOUNTER — Other Ambulatory Visit: Payer: Self-pay

## 2021-10-02 DIAGNOSIS — R202 Paresthesia of skin: Secondary | ICD-10-CM | POA: Diagnosis not present

## 2021-10-02 DIAGNOSIS — R072 Precordial pain: Secondary | ICD-10-CM

## 2021-10-02 DIAGNOSIS — I10 Essential (primary) hypertension: Secondary | ICD-10-CM

## 2021-10-02 LAB — COMPREHENSIVE METABOLIC PANEL
ALT: 23 U/L (ref 0–44)
AST: 35 U/L (ref 15–41)
Albumin: 4 g/dL (ref 3.5–5.0)
Alkaline Phosphatase: 68 U/L (ref 38–126)
Anion gap: 9 (ref 5–15)
BUN: 12 mg/dL (ref 8–23)
CO2: 23 mmol/L (ref 22–32)
Calcium: 8.9 mg/dL (ref 8.9–10.3)
Chloride: 105 mmol/L (ref 98–111)
Creatinine, Ser: 1.2 mg/dL (ref 0.61–1.24)
GFR, Estimated: >60 mL/min (ref >60)
Glucose, Bld: 114 mg/dL — ABNORMAL HIGH (ref 70–99)
Potassium: 3.8 mmol/L (ref 3.5–5.1)
Sodium: 137 mmol/L (ref 135–145)
Total Bilirubin: 0.9 mg/dL (ref 0.3–1.2)
Total Protein: 6.4 g/dL — ABNORMAL LOW (ref 6.5–8.1)

## 2021-10-02 LAB — CBC
HCT: 45 % (ref 39.0–52.0)
Hemoglobin: 16 g/dL (ref 13.0–17.0)
MCH: 31.4 pg (ref 26.0–34.0)
MCHC: 35.6 g/dL (ref 30.0–36.0)
MCV: 88.4 fL (ref 80.0–100.0)
Platelets: 196 10*3/uL (ref 150–400)
RBC: 5.09 MIL/uL (ref 4.22–5.81)
RDW: 12.6 % (ref 11.5–15.5)
WBC: 4.6 10*3/uL (ref 4.0–10.5)
nRBC: 0 % (ref 0.0–0.2)

## 2021-10-02 LAB — APTT: aPTT: 27 seconds (ref 24–36)

## 2021-10-02 LAB — I-STAT CHEM 8, ED
BUN: 12 mg/dL (ref 8–23)
Calcium, Ion: 1.13 mmol/L — ABNORMAL LOW (ref 1.15–1.40)
Chloride: 103 mmol/L (ref 98–111)
Creatinine, Ser: 1.1 mg/dL (ref 0.61–1.24)
Glucose, Bld: 111 mg/dL — ABNORMAL HIGH (ref 70–99)
HCT: 46 % (ref 39.0–52.0)
Hemoglobin: 15.6 g/dL (ref 13.0–17.0)
Potassium: 3.4 mmol/L — ABNORMAL LOW (ref 3.5–5.1)
Sodium: 140 mmol/L (ref 135–145)
TCO2: 27 mmol/L (ref 22–32)

## 2021-10-02 LAB — PROTIME-INR
INR: 1 (ref 0.8–1.2)
Prothrombin Time: 13.3 seconds (ref 11.4–15.2)

## 2021-10-02 LAB — DIFFERENTIAL
Abs Immature Granulocytes: 0.01 10*3/uL (ref 0.00–0.07)
Basophils Absolute: 0 10*3/uL (ref 0.0–0.1)
Basophils Relative: 0 %
Eosinophils Absolute: 0.1 10*3/uL (ref 0.0–0.5)
Eosinophils Relative: 3 %
Immature Granulocytes: 0 %
Lymphocytes Relative: 27 %
Lymphs Abs: 1.3 10*3/uL (ref 0.7–4.0)
Monocytes Absolute: 0.4 10*3/uL (ref 0.1–1.0)
Monocytes Relative: 9 %
Neutro Abs: 2.8 10*3/uL (ref 1.7–7.7)
Neutrophils Relative %: 61 %

## 2021-10-02 LAB — URINALYSIS, ROUTINE W REFLEX MICROSCOPIC
Bilirubin Urine: NEGATIVE
Glucose, UA: NEGATIVE mg/dL
Hgb urine dipstick: NEGATIVE
Ketones, ur: NEGATIVE mg/dL
Leukocytes,Ua: NEGATIVE
Nitrite: NEGATIVE
Protein, ur: NEGATIVE mg/dL
Specific Gravity, Urine: 1.012 (ref 1.005–1.030)
pH: 7 (ref 5.0–8.0)

## 2021-10-02 LAB — RAPID URINE DRUG SCREEN, HOSP PERFORMED
Amphetamines: NOT DETECTED
Barbiturates: NOT DETECTED
Benzodiazepines: NOT DETECTED
Cocaine: NOT DETECTED
Opiates: NOT DETECTED
Tetrahydrocannabinol: NOT DETECTED

## 2021-10-02 LAB — RESP PANEL BY RT-PCR (FLU A&B, COVID) ARPGX2
Influenza A by PCR: NEGATIVE
Influenza B by PCR: NEGATIVE
SARS Coronavirus 2 by RT PCR: NEGATIVE

## 2021-10-02 LAB — TROPONIN I (HIGH SENSITIVITY): Troponin I (High Sensitivity): 3 ng/L (ref <18)

## 2021-10-02 LAB — ETHANOL: Alcohol, Ethyl (B): <10 mg/dL (ref <10)

## 2021-10-02 NOTE — ED Notes (Signed)
Per Collier [place] PA pt does not need second troponin drawn, will d/c ?

## 2021-10-02 NOTE — ED Notes (Signed)
Pt off floor '@731'$  ? ?

## 2021-10-02 NOTE — ED Notes (Signed)
Pt verbalized understanding of d/c instructions, meds and followup care. Denies questions. VSS, no distress noted. Steady gait to exit with all belongings.  ?

## 2021-10-02 NOTE — ED Triage Notes (Signed)
Pt arrives to ED POV c/o of HTN. Pt states he was at home watching tv and began having "Tingling" feeling on his entire left side of body. Pt checked his BP and it was in the 170's. Pt a/o x4. ?

## 2021-10-02 NOTE — ED Provider Triage Note (Signed)
?  Emergency Medicine Provider Triage Evaluation Note ? ?MRN:  [id number]  ?Arrival date & time: 10/02/21    ?Medically screening exam initiated at 12:34 AM.   ?CC:   ?Hypertension ?  ?HPI:  ?Brian Matthews is a 71 y.o. year-old male presents to the ED with chief complaint of numbness and tingling of left hand and foot with very brief episode of left leg weakness that resolved quickly.  States that his BP was high during the episode. ? ?History provided by  ?ROS:  ?-As included in HPI ?PE:  ? ?Vitals:  ? 10/02/21 0011  ?BP: (!) 147/91  ?Pulse: 78  ?Resp: 16  ?Temp: 98 ?F (36.7 ?C)  ?SpO2: 98%  ?  ?Non-toxic appearing ?No respiratory distress ?CN 3-12 intact, speech is clear, normal gait, movements are goal oriented ?MDM:  ?Hx of paresthesias. ? ?I consulted with Dr. Leonel Ramsay, who recommends MRI brain.  NOT code stroke. ? ?Patient was informed that the remainder of the evaluation will be completed by another provider, this initial triage assessment does not replace that evaluation, and the importance of remaining in the ED until their evaluation is complete. ? ?  ?Montine Circle, PA-C ?10/02/21 0036 ? ?

## 2021-10-02 NOTE — ED Provider Notes (Signed)
?[facility] ?Provider Note ? ? ?CSN: [id number] ?Arrival date & time: 10/01/21  2355 ? ?  ? ?History ? ?Chief Complaint  ?Patient presents with  ? Hypertension  ? ? ?Brian Matthews is a 71 y.o. male with medical history of hypertension, subacute thyroiditis, hyperlipidemia, acute gastric ulcer with hemorrhage. ? ?Presents to the emergency department with a chief complaint of hypertension and tingling sensation.  Patient states that yesterday evening around 9:30 PM he was watching TV when he started having a tingling sensation to the fingers and toes on his left hand.  Patient checked his blood pressure and found to be elevated.  Blood pressure originally elevated at 155/97.  Pressure steadily increased with a maximum blood pressure of 176/95.  Patient states that he "felt funny," during this episode and also reported a "strange feeling," to the left side of his chest.  Patient reports that tingling sensation started to resolve around 11 PM when he started coming to the emergency department.  Patient has not had any tingling sensations.   Patient reports that chest pain lasted for few seconds and then resolved.  Patient has not had any chest pain since then. ? ?Patient denies any recent falls or traumatic injuries, EtOH use, drug use. ? ?Patient denies any headache, weakness, facial asymmetry, dysarthria, visual disturbance, lightheadedness, syncope, shortness of breath, abdominal pain, nausea, vomiting, fever, chills. ? ? ? ? ?Hypertension ?Associated symptoms include chest pain. Pertinent negatives include no abdominal pain, no headaches and no shortness of breath.  ? ?  ? ?Home Medications ?Prior to Admission medications   ?Medication Sig Start Date End Date Taking? Authorizing Provider  ?amLODipine (NORVASC) 5 MG tablet TAKE 1 TABLET(5 MG) BY MOUTH DAILY 05/22/21   Michela Pitcher, NP  ?Cholecalciferol (VITAMIN D-3 PO) Take 1 capsule by mouth 2 (two) times a week.      [provider]  ?Cyanocobalamin (VITAMIN B-12 PO) Take 1 tablet by mouth 2 (two) times a week.     [provider]  ?Misc Natural Products (GLUCOS-CHONDROIT-MSM COMPLEX) TABS Take 2 tablets by mouth daily.    [provider]  ?Multiple Vitamin (MULTIVITAMIN) tablet Take 1 tablet by mouth daily.    [provider]  ?Omega-3 Fatty Acids (FISH OIL) 1200 MG CAPS Take 1 capsule by mouth 3 (three) times daily.     [provider]  ?OVER THE COUNTER MEDICATION Prostate Blend 1000 mg 3 ? tablets ? daily.    [provider]  ?Saw Palmetto 450 MG CAPS Take 6 capsules by mouth daily.     [provider]  ?triamterene-hydrochlorothiazide (MAXZIDE-25) 37.5-25 MG tablet TAKE 1 TABLET BY MOUTH DAILY 02/19/21   Michela Pitcher, NP  ?   ? ?Allergies    ?Lisinopril and Penicillins   ? ?Review of Systems   ?Review of Systems  ?Constitutional:  Negative for chills and fever.  ?Eyes:  Negative for visual disturbance.  ?Respiratory:  Negative for shortness of breath.   ?Cardiovascular:  Positive for chest pain. Negative for palpitations and leg swelling.  ?Gastrointestinal:  Negative for abdominal pain, nausea and vomiting.  ?Musculoskeletal:  Negative for back pain and neck pain.  ?Skin:  Negative for color change and rash.  ?Neurological:  Positive for numbness. Negative for dizziness, tremors, seizures, syncope, facial asymmetry, speech difficulty, weakness, light-headedness and headaches.  ?Psychiatric/Behavioral:  Negative for confusion.   ? ?Physical Exam ?Updated Vital Signs ?BP (!) 144/86 (BP Location: Left Arm)  Pulse 66   Temp 97.6 ?F (36.4 ?C)   Resp 18   SpO2 99%  ?Physical Exam ?Vitals and nursing note reviewed.  ?Constitutional:   ?   General: He is not in acute distress. ?   Appearance: He is not ill-appearing, toxic-appearing or diaphoretic.  ?HENT:  ?   Head: Normocephalic.  ?Eyes:  ?   General:     ?   Right eye: No discharge.     ?   Left eye: No  discharge.  ?   Extraocular Movements: Extraocular movements intact.  ?   Conjunctiva/sclera: Conjunctivae normal.  ?   Pupils: Pupils are equal, round, and reactive to light.  ?Cardiovascular:  ?   Rate and Rhythm: Normal rate.  ?   Pulses:     ?     Radial pulses are 2+ on the right side and 2+ on the left side.  ?Pulmonary:  ?   Effort: Pulmonary effort is normal. No tachypnea, bradypnea or respiratory distress.  ?   Breath sounds: Normal breath sounds. No stridor.  ?Musculoskeletal:  ?   Cervical back: Normal range of motion and neck supple. No rigidity.  ?Skin: ?   General: Skin is warm and dry.  ?Neurological:  ?   General: No focal deficit present.  ?   Mental Status: He is alert and oriented to person, place, and time.  ?   GCS: GCS eye subscore is 4. GCS verbal subscore is 5. GCS motor subscore is 6.  ?   Cranial Nerves: No cranial nerve deficit or facial asymmetry.  ?   Sensory: Sensation is intact.  ?   Motor: No weakness, tremor, seizure activity or pronator drift.  ?   Coordination: Romberg sign negative. Finger-Nose-Finger Test normal.  ?   Gait: Gait is intact. Gait normal.  ?   Comments: CN II-XII intact, equal grip strength, +5 strength to bilateral upper and lower extremities, sensation to light touch grossly intact to bilateral upper and lower extremities  ?Psychiatric:     ?   Behavior: Behavior is cooperative.  ? ? ?ED Results / Procedures / Treatments   ?Labs ?(all labs ordered are listed, but only abnormal results are displayed) ?Labs Reviewed  ?COMPREHENSIVE METABOLIC PANEL - Abnormal; Notable for the following components:  ?    Result Value  ? Glucose, Bld 114 (*)   ? Total Protein 6.4 (*)   ? All other components within normal limits  ?I-STAT CHEM 8, ED - Abnormal; Notable for the following components:  ? Potassium 3.4 (*)   ? Glucose, Bld 111 (*)   ? Calcium, Ion 1.13 (*)   ? All other components within normal limits  ?RESP PANEL BY RT-PCR (FLU A&B, COVID) ARPGX2  ?ETHANOL  ?PROTIME-INR   ?APTT  ?CBC  ?DIFFERENTIAL  ?RAPID URINE DRUG SCREEN, HOSP PERFORMED  ?URINALYSIS, ROUTINE W REFLEX MICROSCOPIC  ? ? ?EKG ?EKG Interpretation ? ?Date/Time:  Tuesday October 01 2021 23:55:47 EDT ?Ventricular Rate:  85 ?PR Interval:  160 ?QRS Duration: 84 ?QT Interval:  348 ?QTC Calculation: 414 ?R Axis:   79 ?Text Interpretation: Normal sinus rhythm Normal ECG When compared with ECG of 23-Mar-2019 18:30, No significant change was found Confirmed by Delora Fuel ([id number]) on 10/02/2021 4:31:31 AM ? ?Radiology ?MR BRAIN WO CONTRAST ? ?Result Date: 10/02/2021 ?CLINICAL DATA:  Tingling feeling on left side of body beginning suddenly EXAM: MRI HEAD WITHOUT CONTRAST TECHNIQUE: Multiplanar, multiecho pulse sequences of the brain and surrounding structures  were obtained without intravenous contrast. COMPARISON:  12/25/2020 FINDINGS: Brain: No acute infarction, hemorrhage, hydrocephalus, extra-axial collection or mass lesion. Few for age small FLAIR hyperintensities scattered in the cerebral white matter, likely early small vessel ischemic change given age and history of hypertension. Brain volume is normal. No specific demyelinating pattern. Vascular: Normal flow voids. Skull and upper cervical spine: Normal marrow signal. Sinuses/Orbits: Negative. IMPRESSION: No acute finding or explanation for symptoms. Electronically Signed   By: Jorje Guild M.D.   On: 10/02/2021 04:31   ? ?Procedures ?Procedures  ? ? ?Medications Ordered in ED ?Medications - No data to display ? ?ED Course/ Medical Decision Making/ A&P ?  ?                        ?Medical Decision Making ?Amount and/or Complexity of Data Reviewed ?Radiology: ordered. ? ? ?Alert 71 year old male in no acute distress, nontoxic-appearing.  Presents to the emergency department with a complaint of hypertension and tingling sensation. ? ?Information obtained from patient.  Past medical records were reviewed including previous provider notes, labs, and imaging.  Patient has  medical history as outlined in HPI which complicates his care. ? ?Work-up was initiated while patient was in triage. ? ?I personally viewed and interpreted patient's lab results.  Pertinent findings include: ?-CMP, CBC, UA,

## 2021-10-02 NOTE — Discharge Instructions (Signed)
You came to the emergency department today to be evaluated for your high blood pressure and tingling sensation to your fingers.  Your physical exam and lab work were reassuring.  The MRI of your brain did not show any acute strokes.  Please follow-up closely with your primary care doctor for repeat evaluation. ? ?Get help right away if you: ?Feel muscle weakness. ?Develop new weakness in an arm or leg. ?Have trouble walking or moving. ?Have problems with speech, understanding, or vision. ?Feel confused. ?Cannot control your bladder or bowel movements. ? ?

## 2021-10-04 ENCOUNTER — Other Ambulatory Visit (INDEPENDENT_AMBULATORY_CARE_PROVIDER_SITE_OTHER): Payer: Medicare Other

## 2021-10-04 DIAGNOSIS — R944 Abnormal results of kidney function studies: Secondary | ICD-10-CM

## 2021-10-04 LAB — BASIC METABOLIC PANEL
BUN: 13 mg/dL (ref 6–23)
CO2: 28 mEq/L (ref 19–32)
Calcium: 9.1 mg/dL (ref 8.4–10.5)
Chloride: 102 mEq/L (ref 96–112)
Creatinine, Ser: 1.23 mg/dL (ref 0.40–1.50)
GFR: 59.75 mL/min — ABNORMAL LOW (ref >60.00)
Glucose, Bld: 86 mg/dL (ref 70–99)
Potassium: 4.3 mEq/L (ref 3.5–5.1)
Sodium: 138 mEq/L (ref 135–145)

## 2021-10-07 ENCOUNTER — Encounter: Payer: Self-pay | Admitting: Nurse Practitioner

## 2021-10-14 ENCOUNTER — Ambulatory Visit (INDEPENDENT_AMBULATORY_CARE_PROVIDER_SITE_OTHER): Payer: Medicare Other | Admitting: Nurse Practitioner

## 2021-10-14 ENCOUNTER — Encounter: Payer: Self-pay | Admitting: Nurse Practitioner

## 2021-10-14 VITALS — BP 122/74 | HR 63 | Temp 97.0°F | Resp 16 | Ht 72.0 in | Wt 173.0 lb

## 2021-10-14 DIAGNOSIS — R944 Abnormal results of kidney function studies: Secondary | ICD-10-CM | POA: Diagnosis not present

## 2021-10-14 DIAGNOSIS — M25552 Pain in left hip: Secondary | ICD-10-CM

## 2021-10-14 LAB — BASIC METABOLIC PANEL
BUN: 15 mg/dL (ref 6–23)
CO2: 29 mEq/L (ref 19–32)
Calcium: 9.2 mg/dL (ref 8.4–10.5)
Chloride: 104 mEq/L (ref 96–112)
Creatinine, Ser: 1.17 mg/dL (ref 0.40–1.50)
GFR: 63.44 mL/min (ref >60.00)
Glucose, Bld: 89 mg/dL (ref 70–99)
Potassium: 4.7 mEq/L (ref 3.5–5.1)
Sodium: 140 mEq/L (ref 135–145)

## 2021-10-14 NOTE — Progress Notes (Signed)
? ?Established Patient Office Visit ? ?Subjective   ?Patient ID: Brian Matthews, male    DOB: January 09, 1951  Age: 71 y.o. MRN: [id number] ? ?Chief Complaint  ?Patient presents with  ? Discuss kidney function  ?  Review labs ?  ? ? ?HPI ? ?Patient has been tracking his renal function and noticed that he had a drop in GFR. He is concerned that it has drpoped down. He is on several blood pressure medications. Maxide and amlodipine for htn. He Blood pressure is well controled in office.  ?He is avoiding NSAIDs (hx of Gi bleeds). He is havint 4x 16 oz bottle sof water and will have a cup of juice and maybe a cup of tea. ? ?States voer the past 3 months he has less volume and increased frewuncy but was evaluated by urology. He also stopped taking the saw palematto and prostate health  ? ? ? ?Review of Systems  ?Constitutional:  Negative for chills and fever.  ?Genitourinary:  Negative for dysuria and hematuria.  ?Musculoskeletal:  Positive for joint pain.  ? ?  ?Objective:  ?  ? ?BP 122/74   Pulse 63   Temp (!) 97 ?F (36.1 ?C)   Resp 16   Ht 6' (1.829 m)   Wt 173 lb (78.5 kg)   SpO2 97%   BMI 23.46 kg/m?  ? ? ?Physical Exam ?Vitals and nursing note reviewed.  ?Constitutional:   ?   Appearance: Normal appearance.  ?Cardiovascular:  ?   Rate and Rhythm: Normal rate and regular rhythm.  ?   Pulses: Normal pulses.  ?   Heart sounds: Normal heart sounds.  ?Pulmonary:  ?   Effort: Pulmonary effort is normal.  ?   Breath sounds: Normal breath sounds.  ?Abdominal:  ?   General: Bowel sounds are normal.  ?   Tenderness: There is no right CVA tenderness or left CVA tenderness.  ?Musculoskeletal:  ?   Lumbar back: No tenderness or bony tenderness. Negative right straight leg raise test and negative left straight leg raise test.  ?   Right lower leg: No edema.  ?   Left lower leg: No edema.  ?   Comments: Hip FROM  ?Neurological:  ?   Mental Status: He is alert.  ? ? ? ?No results found for any visits on 10/14/21. ? ? ? ?The  10-year ASCVD risk score (Arnett DK, et al., 2019) is: 29% ? ?  ?Assessment & Plan:  ? ?Problem List Items Addressed This Visit   ? ?  ? Other  ? Pain of left hip joint  ?  No discrete injury.  Patient is fairly active.  Patient had good range of motion in office.  Offered to do picture of left hip, patient politely declined.  Continue monitor ? ?  ?  ? Decreased GFR - Primary  ?  Patient had modest decrease in GFR over the past few checks 2 years ago GFR was in the 80s within the past year has been in the 60s most recent ones were mid 48s to high 50s.  Patient is concerned with a dip in GFR would like to come off Maxide.  Did discuss that this is a modest decrease and that I do not think coming off Maxide would be a good idea as we had to put him on different agent to draw blood pressure.  Did discuss with him that he is already on a calcium channel blocker and an adequate therapy will need  to be [employer] and also decrease GFR and increase creatinine and potassium.  Pending BMP today.  If still decreased we will consider having the amount of Maxide patient takes and rechecking if no improvement we did discuss sending him to nephrology for further work-up and evaluation. ? ?  ?  ? Relevant Orders  ? Basic metabolic panel  ? ? ?No follow-ups on file.  ? ? ?Romilda Garret, NP ? ?

## 2021-10-14 NOTE — Assessment & Plan Note (Signed)
Patient had modest decrease in GFR over the past few checks 2 years ago GFR was in the 80s within the past year has been in the 60s most recent ones were mid 27s to high 50s.  Patient is concerned with a dip in GFR would like to come off Maxide.  Did discuss that this is a modest decrease and that I do not think coming off Maxide would be a good idea as we had to put him on different agent to draw blood pressure.  Did discuss with him that he is already on a calcium channel blocker and an adequate therapy will need to be [employer] and also decrease GFR and increase creatinine and potassium.  Pending BMP today.  If still decreased we will consider having the amount of Maxide patient takes and rechecking if no improvement we did discuss sending him to nephrology for further work-up and evaluation. ?

## 2021-10-14 NOTE — Assessment & Plan Note (Signed)
No discrete injury.  Patient is fairly active.  Patient had good range of motion in office.  Offered to do picture of left hip, patient politely declined.  Continue monitor ?

## 2021-10-14 NOTE — Patient Instructions (Signed)
Nice to see you today ?I will be in touch with the results once I have them ?DO NOT change medication currently ? ?

## 2021-11-13 ENCOUNTER — Ambulatory Visit: Payer: Medicare Other | Admitting: Dermatology

## 2021-11-20 ENCOUNTER — Other Ambulatory Visit: Payer: Self-pay

## 2021-11-20 DIAGNOSIS — I10 Essential (primary) hypertension: Secondary | ICD-10-CM

## 2021-11-20 MED ORDER — AMLODIPINE BESYLATE 5 MG PO TABS: 5.0000 mg | ORAL_TABLET | Freq: Every day | ORAL | 1 refills | Status: DC

## 2021-11-20 NOTE — Telephone Encounter (Signed)
Refill request from pharmacy for Amlodipine.

## 2022-02-17 ENCOUNTER — Encounter: Payer: Self-pay | Admitting: Nurse Practitioner

## 2022-02-18 ENCOUNTER — Other Ambulatory Visit: Payer: Self-pay | Admitting: Nurse Practitioner

## 2022-02-18 DIAGNOSIS — I10 Essential (primary) hypertension: Secondary | ICD-10-CM

## 2022-02-20 ENCOUNTER — Ambulatory Visit (INDEPENDENT_AMBULATORY_CARE_PROVIDER_SITE_OTHER): Payer: Medicare Other | Admitting: Nurse Practitioner

## 2022-02-20 ENCOUNTER — Telehealth: Payer: Self-pay | Admitting: Nurse Practitioner

## 2022-02-20 VITALS — BP 124/78 | HR 68 | Temp 96.9°F | Ht 72.0 in | Wt 170.2 lb

## 2022-02-20 DIAGNOSIS — R1909 Other intra-abdominal and pelvic swelling, mass and lump: Secondary | ICD-10-CM | POA: Diagnosis not present

## 2022-02-20 DIAGNOSIS — R1900 Intra-abdominal and pelvic swelling, mass and lump, unspecified site: Secondary | ICD-10-CM | POA: Insufficient documentation

## 2022-02-20 NOTE — Progress Notes (Signed)
   Acute Office Visit  Subjective:     Patient ID: Brian Matthews, male    DOB: 1950-10-22, 71 y.o.   MRN: [id number]  Chief Complaint  Patient presents with   Mass    Lump pea sized on left side, noticed about 10 to 11 days ago. No pain.     HPI Patient is in today for Mass  States that he noticed it approx 10-11 days ago. States that he was sitting there one day and felt his side. States it is a pea sized lump under his skin. Does not hurt. No discharge   Review of Systems  Constitutional:  Negative for chills and fever.  Skin:  Negative for itching and rash.       "+" lesion         Objective:    BP 124/78   Pulse 68   Temp (!) 96.9 F (36.1 C) (Temporal)   Ht 6' (1.829 m)   Wt 170 lb 4 oz (77.2 kg)   SpO2 97%   BMI 23.09 kg/m    Physical Exam Vitals and nursing note reviewed.  Constitutional:      Appearance: Normal appearance.  Cardiovascular:     Rate and Rhythm: Normal rate and regular rhythm.     Heart sounds: Normal heart sounds.  Pulmonary:     Effort: Pulmonary effort is normal.     Breath sounds: Normal breath sounds.  Abdominal:    Neurological:     Mental Status: He is alert.     No results found for any visits on 02/20/22.      Assessment & Plan:   Problem List Items Addressed This Visit       Other   Abdominal lump - Primary    Likely a cyst on exam.  Did encourage patient to contact dermatology to establish with.  No signs of infection noted today       No orders of the defined types were placed in this encounter.   Return in about 3 months (around 05/22/2022) for Wellness visit.  Romilda Garret, NP

## 2022-02-20 NOTE — Telephone Encounter (Signed)
Patient advised.

## 2022-02-20 NOTE — Patient Instructions (Signed)
Nice to see you today I do think what you are feeling is a cyst. Call and make an appointment with your dermatologist for further evaluation  Follow up for your wellness visit/physical with me after 05/07/2022

## 2022-02-20 NOTE — Assessment & Plan Note (Signed)
Likely a cyst on exam.  Did encourage patient to contact dermatology to establish with.  No signs of infection noted today

## 2022-02-20 NOTE — Telephone Encounter (Signed)
Pt called in stated he was not able to get an early appointment with the dermatologist  he's establish with . Would like to get a referral for one . Please advise [phone]

## 2022-02-20 NOTE — Telephone Encounter (Signed)
Referral placed.

## 2022-03-20 ENCOUNTER — Ambulatory Visit: Payer: Medicare Other

## 2022-03-25 ENCOUNTER — Ambulatory Visit (INDEPENDENT_AMBULATORY_CARE_PROVIDER_SITE_OTHER): Payer: Medicare Other

## 2022-03-25 DIAGNOSIS — Z23 Encounter for immunization: Secondary | ICD-10-CM | POA: Diagnosis not present

## 2022-04-10 ENCOUNTER — Encounter: Payer: Self-pay | Admitting: Internal Medicine

## 2022-04-20 ENCOUNTER — Encounter: Payer: Self-pay | Admitting: Internal Medicine

## 2022-04-20 ENCOUNTER — Encounter: Payer: Self-pay | Admitting: Nurse Practitioner

## 2022-04-25 ENCOUNTER — Ambulatory Visit (AMBULATORY_SURGERY_CENTER): Payer: Self-pay | Admitting: *Deleted

## 2022-04-25 VITALS — Ht 72.0 in | Wt 170.0 lb

## 2022-04-25 DIAGNOSIS — Z86010 Personal history of colonic polyps: Secondary | ICD-10-CM

## 2022-04-25 NOTE — Progress Notes (Signed)

## 2022-05-15 ENCOUNTER — Ambulatory Visit (AMBULATORY_SURGERY_CENTER): Payer: Medicare Other | Admitting: Internal Medicine

## 2022-05-15 ENCOUNTER — Encounter: Payer: Self-pay | Admitting: Internal Medicine

## 2022-05-15 VITALS — BP 123/76 | HR 59 | Temp 97.7°F | Resp 15 | Ht 72.0 in | Wt 170.0 lb

## 2022-05-15 DIAGNOSIS — Z800 Family history of malignant neoplasm of digestive organs: Secondary | ICD-10-CM

## 2022-05-15 DIAGNOSIS — Z09 Encounter for follow-up examination after completed treatment for conditions other than malignant neoplasm: Secondary | ICD-10-CM | POA: Diagnosis not present

## 2022-05-15 DIAGNOSIS — Z86010 Personal history of colonic polyps: Secondary | ICD-10-CM

## 2022-05-15 NOTE — Progress Notes (Signed)
Pt resting comfortably. VSS. Airway intact. SBAR complete to RN. All questions answered.   

## 2022-05-15 NOTE — Progress Notes (Signed)
[facility] History and Physical   Primary Care Physician:  Michela Pitcher, NP   Reason for Procedure:   Hx polyp and FHx CRCA  Plan:    colonoscopy     HPI: Brian Matthews is a 71 y.o. male w/ FHx CRCA in sister at 69 and prior diminutive adenoma removed (2018)   Past Medical History:  Diagnosis Date   Allergy    Broken wrist    Childhood asthma    Colon polyps    GERD (gastroesophageal reflux disease)    History of blood transfusion    Hx of adenomatous polyp of colon 04/29/2017   Hypertension    Hyperthyroidism    Positive TB test     Past Surgical History:  Procedure Laterality Date   COLONOSCOPY  04/2017   Dr Carlean Purl   SKIN BIOPSY     04/24/22   UPPER GASTROINTESTINAL ENDOSCOPY     [id number]    Prior to Admission medications   Medication Sig Start Date End Date Taking? Authorizing Provider  amLODipine (NORVASC) 5 MG tablet Take 1 tablet (5 mg total) by mouth daily. 11/20/21  Yes Michela Pitcher, NP  Cholecalciferol (VITAMIN D-3) 125 MCG (5000 UT) TABS Take by mouth. 1 tablet 4-5 days per week   Yes [provider]  Collagen-Vitamin C (ULTRA COLLAGEN + C PO) Take by mouth daily.   Yes [provider]  Cranberry 500 MG TABS Take by mouth daily.   Yes [provider]  Magnesium 500 MG CAPS Take by mouth daily.   Yes [provider]  Misc Natural Products (GLUCOSAMINE CHOND COMPLEX/MSM) TABS Take by mouth daily.   Yes [provider]  Misc Natural Products ([place]) Take 1 tablet by mouth daily.   Yes [provider]  Multiple Vitamin (MULTIVITAMIN) tablet Take 1 tablet by mouth daily.   Yes [provider]  Omega-3 Fatty Acids (FISH OIL) 1200 MG CAPS Take by mouth. 1-2 per day   Yes [provider]  Plant Sterols and Stanols (CHOLESTOFF PO) Take 900 mg by mouth daily.   Yes [provider]  Saw Palmetto 450 MG CAPS Take 2 capsules by mouth daily.    Yes [provider]  triamterene-hydrochlorothiazide (MAXZIDE-25) 37.5-25 MG tablet TAKE 1 TABLET BY MOUTH DAILY 02/18/22  Yes Michela Pitcher, NP  Turmeric (QC TUMERIC COMPLEX) 500 MG CAPS Take by mouth daily.   Yes [provider]  vitamin B-12 (CYANOCOBALAMIN) 500 MCG tablet Take 500 mcg by mouth daily. 1 tablet 4-5 days per week   Yes [provider]  Alpha-Lipoic Acid 200 MG CAPS Take by mouth daily.    [provider]    Current Outpatient Medications  Medication Sig Dispense Refill   amLODipine (NORVASC) 5 MG tablet Take 1 tablet (5 mg total) by mouth daily. 90 tablet 1   Cholecalciferol (VITAMIN D-3) 125 MCG (5000 UT) TABS Take by mouth. 1 tablet 4-5 days per week     Collagen-Vitamin C (ULTRA COLLAGEN + C PO) Take by mouth daily.     Cranberry 500 MG TABS Take by mouth daily.     Magnesium 500 MG CAPS Take by mouth daily.     Misc Natural Products (GLUCOSAMINE CHOND COMPLEX/MSM) TABS Take by mouth daily.     Misc Natural Products (MENS PROSTATE HEALTH FORMULA PO) Take 1 tablet by mouth daily.     Multiple Vitamin (MULTIVITAMIN) tablet Take 1 tablet by mouth daily.  Omega-3 Fatty Acids (FISH OIL) 1200 MG CAPS Take by mouth. 1-2 per day     Plant Sterols and Stanols (CHOLESTOFF PO) Take 900 mg by mouth daily.     Saw Palmetto 450 MG CAPS Take 2 capsules by mouth daily.     triamterene-hydrochlorothiazide (MAXZIDE-25) 37.5-25 MG tablet TAKE 1 TABLET BY MOUTH DAILY 90 tablet 3   Turmeric (QC TUMERIC COMPLEX) 500 MG CAPS Take by mouth daily.     vitamin B-12 (CYANOCOBALAMIN) 500 MCG tablet Take 500 mcg by mouth daily. 1 tablet 4-5 days per week     Alpha-Lipoic Acid 200 MG CAPS Take by mouth daily.     No current facility-administered medications for this visit.    Allergies as of 05/15/2022 - Review Complete 05/15/2022  Allergen Reaction Noted   Lisinopril Cough 10/17/2014   Penicillins Other (See Comments) 10/17/2014    Family History   Problem Relation Age of Onset   Hypertension Mother    Arthritis Father        Rheumatoid Artritis   Heart Problems Father    Colon cancer Sister    Brain cancer Brother    Melanoma Brother    Thyroid disease Neg Hx    Esophageal cancer Neg Hx    Pancreatic cancer Neg Hx    Rectal cancer Neg Hx    Stomach cancer Neg Hx    Crohn's disease Neg Hx    Colon polyps Neg Hx    Ulcerative colitis Neg Hx     Social History   Socioeconomic History   Marital status: Divorced    Spouse name: Not on file   Number of children: 0   Years of education: Not on file   Highest education level: Not on file  Occupational History   Occupation: Educational psychologist    Comment: Retired  Tobacco Use   Smoking status: Never    Passive exposure: Past (sister smoked 50 years ago)   Smokeless tobacco: Never  Vaping Use   Vaping Use: Never used  Substance and Sexual Activity   Alcohol use: No    Alcohol/week: 0.0 standard drinks of alcohol   Drug use: No   Sexual activity: Not on file  Other Topics Concern   Not on file  Social History Narrative   Right Handed    Lives in a two story home    Social Determinants of Health   Financial Resource Strain: Not on file  Food Insecurity: Not on file  Transportation Needs: Not on file  Physical Activity: Not on file  Stress: Not on file  Social Connections: Not on file  Intimate Partner Violence: Not on file    Review of Systems:  All other review of systems negative except as mentioned in the HPI.  Physical Exam: Vital signs BP 129/71   Pulse 73   Temp 97.7 F (36.5 C)   Resp 18   Ht 6' (1.829 m)   Wt 170 lb (77.1 kg)   SpO2 99%   BMI 23.06 kg/m   General:   Alert,  Well-developed, well-nourished, pleasant and cooperative in NAD Lungs:  Clear throughout to auscultation.   Heart:  Regular rate and rhythm; no murmurs, clicks, rubs,  or gallops. Abdomen:  Soft, nontender and nondistended. Normal bowel sounds.   Neuro/Psych:  Alert  and cooperative. Normal mood and affect. A and O x 3   '@Jezabelle Chisolm'$  Simonne Maffucci, MD, [facility] [phone] (pager) 05/15/2022 2:12 PM@

## 2022-05-15 NOTE — Patient Instructions (Addendum)
No polyps or cancer seen.  Your next routine colonoscopy should be in 5 years - 2028.  Please restart your regular medications and diet.  I appreciate the opportunity to care for you. Gatha Mayer, MD, FACG  YOU HAD AN ENDOSCOPIC PROCEDURE TODAY AT [facility]:   Refer to the procedure report that was given to you for any specific questions about what was found during the examination.  If the procedure report does not answer your questions, please call your gastroenterologist to clarify.  If you requested that your care partner not be given the details of your procedure findings, then the procedure report has been included in a sealed envelope for you to review at your convenience later.  YOU SHOULD EXPECT: Some feelings of bloating in the abdomen. Passage of more gas than usual.  Walking can help get rid of the air that was put into your GI tract during the procedure and reduce the bloating. If you had a lower endoscopy (such as a colonoscopy or flexible sigmoidoscopy) you may notice spotting of blood in your stool or on the toilet paper. If you underwent a bowel prep for your procedure, you may not have a normal bowel movement for a few days.  Please Note:  You might notice some irritation and congestion in your nose or some drainage.  This is from the oxygen used during your procedure.  There is no need for concern and it should clear up in a day or so.  SYMPTOMS TO REPORT IMMEDIATELY:  Following lower endoscopy (colonoscopy or flexible sigmoidoscopy):  Excessive amounts of blood in the stool  Significant tenderness or worsening of abdominal pains  Swelling of the abdomen that is new, acute  Fever of 100F or higher  Following upper endoscopy (EGD)  Vomiting of blood or coffee ground material  New chest pain or pain under the shoulder blades  Painful or persistently difficult swallowing  New shortness of breath  Fever of 100F or higher  Black, tarry-looking  stools  For urgent or emergent issues, a gastroenterologist can be reached at any hour by calling [phone]. Do not use MyChart messaging for urgent concerns.    DIET:  We do recommend a small meal at first, but then you may proceed to your regular diet.  Drink plenty of fluids but you should avoid alcoholic beverages for 24 hours.  ACTIVITY:  You should plan to take it easy for the rest of today and you should NOT DRIVE or use heavy machinery until tomorrow (because of the sedation medicines used during the test).    FOLLOW UP: Our staff will call the number listed on your records the next business day following your procedure.  We will call around 7:15- 8:00 am to check on you and address any questions or concerns that you may have regarding the information given to you following your procedure. If we do not reach you, we will leave a message.     If any biopsies were taken you will be contacted by phone or by letter within the next 1-3 weeks.  Please call us at [phone] if you have not heard about the biopsies in 3 weeks.    SIGNATURES/CONFIDENTIALITY: You and/or your care partner have signed paperwork which will be entered into your electronic medical record.  These signatures attest to the fact that that the information above on your After Visit Summary has been reviewed and is understood.  Full responsibility of the confidentiality of  this discharge information lies with you and/or your care-partner.

## 2022-05-15 NOTE — Op Note (Signed)
[place] Patient Name: Brian Matthews Procedure Date: 05/15/2022 2:03 PM MRN: [id number] Endoscopist: Gatha Mayer , MD, [id number] Age: 71 Referring MD:  Date of Birth: March 15, 1951 Gender: Male Account #: [id number] Procedure:                Colonoscopy Indications:              Surveillance: Personal history of adenomatous                            polyps on last colonoscopy 5 years ago, Last                            colonoscopy: 2018 Medicines:                Monitored Anesthesia Care Procedure:                Pre-Anesthesia Assessment:                           - Prior to the procedure, a History and Physical                            was performed, and patient medications and                            allergies were reviewed. The patient's tolerance of                            previous anesthesia was also reviewed. The risks                            and benefits of the procedure and the sedation                            options and risks were discussed with the patient.                            All questions were answered, and informed consent                            was obtained. Prior Anticoagulants: The patient has                            taken no anticoagulant or antiplatelet agents. ASA                            Grade Assessment: II - A patient with mild systemic                            disease. After reviewing the risks and benefits,                            the patient was deemed in satisfactory condition to  undergo the procedure.                           After obtaining informed consent, the colonoscope                            was passed under direct vision. Throughout the                            procedure, the patient's blood pressure, pulse, and                            oxygen saturations were monitored continuously. The                            Olympus CF-HQ190L ([id number]) Colonoscope was                             introduced through the anus and advanced to the the                            cecum, identified by appendiceal orifice and                            ileocecal valve. The colonoscopy was performed                            without difficulty. The patient tolerated the                            procedure well. The quality of the bowel                            preparation was excellent. The ileocecal valve,                            appendiceal orifice, and rectum were photographed.                            The bowel preparation used was Miralax via split                            dose instruction. Scope In: 2:25:45 PM Scope Out: 2:37:37 PM Scope Withdrawal Time: 0 hours 8 minutes 32 seconds  Total Procedure Duration: 0 hours 11 minutes 52 seconds  Findings:                 The perianal and digital rectal examinations were                            normal.                           The entire examined colon appeared normal on direct  and retroflexion views. Complications:            No immediate complications. Estimated Blood Loss:     Estimated blood loss: none. Impression:               - The entire examined colon is normal on direct and                            retroflexion views.                           - No specimens collected.                           - Personal history of colonic polyp diminutive                            adenoma 2018 - + FHx CRCA sister dx 11. Recommendation:           - Patient has a contact number available for                            emergencies. The signs and symptoms of potential                            delayed complications were discussed with the                            patient. Return to normal activities tomorrow.                            Written discharge instructions were provided to the                            patient.                           - Resume previous diet.                            - Continue present medications.                           - Repeat colonoscopy in 5 years. Gatha Mayer, MD 05/15/2022 2:46:45 PM This report has been signed electronically.

## 2022-05-16 ENCOUNTER — Telehealth: Payer: Self-pay | Admitting: *Deleted

## 2022-05-16 NOTE — Telephone Encounter (Signed)
  Follow up Call-     05/15/2022    1:29 PM  Call back number  Post procedure Call Back phone  # [phone]  Permission to leave phone message Yes     Patient questions:  Do you have a fever, pain , or abdominal swelling? No. Pain Score  0 *  Have you tolerated food without any problems? Yes.    Have you been able to return to your normal activities? Yes.    Do you have any questions about your discharge instructions: Diet   No. Medications  No. Follow up visit  No.  Do you have questions or concerns about your Care? No.  Actions: * If pain score is 4 or above: No action needed, pain <4.

## 2022-05-19 ENCOUNTER — Other Ambulatory Visit: Payer: Self-pay | Admitting: Nurse Practitioner

## 2022-05-19 DIAGNOSIS — I10 Essential (primary) hypertension: Secondary | ICD-10-CM

## 2022-05-22 ENCOUNTER — Ambulatory Visit (INDEPENDENT_AMBULATORY_CARE_PROVIDER_SITE_OTHER): Payer: Medicare Other | Admitting: Nurse Practitioner

## 2022-05-22 ENCOUNTER — Encounter: Payer: Self-pay | Admitting: Nurse Practitioner

## 2022-05-22 VITALS — BP 122/80 | HR 71 | Temp 97.5°F | Ht 71.75 in | Wt 165.8 lb

## 2022-05-22 DIAGNOSIS — Z125 Encounter for screening for malignant neoplasm of prostate: Secondary | ICD-10-CM | POA: Diagnosis not present

## 2022-05-22 DIAGNOSIS — Z0000 Encounter for general adult medical examination without abnormal findings: Secondary | ICD-10-CM | POA: Diagnosis not present

## 2022-05-22 DIAGNOSIS — I10 Essential (primary) hypertension: Secondary | ICD-10-CM

## 2022-05-22 DIAGNOSIS — R944 Abnormal results of kidney function studies: Secondary | ICD-10-CM

## 2022-05-22 DIAGNOSIS — E785 Hyperlipidemia, unspecified: Secondary | ICD-10-CM | POA: Diagnosis not present

## 2022-05-22 DIAGNOSIS — K219 Gastro-esophageal reflux disease without esophagitis: Secondary | ICD-10-CM | POA: Diagnosis not present

## 2022-05-22 LAB — PSA, MEDICARE: PSA: 0.54 ng/ml (ref 0.10–4.00)

## 2022-05-22 LAB — COMPREHENSIVE METABOLIC PANEL
ALT: 20 U/L (ref 0–53)
AST: 23 U/L (ref 0–37)
Albumin: 4.5 g/dL (ref 3.5–5.2)
Alkaline Phosphatase: 57 U/L (ref 39–117)
BUN: 13 mg/dL (ref 6–23)
CO2: 27 mEq/L (ref 19–32)
Calcium: 9.4 mg/dL (ref 8.4–10.5)
Chloride: 102 mEq/L (ref 96–112)
Creatinine, Ser: 1.14 mg/dL (ref 0.40–1.50)
GFR: 65.17 mL/min (ref >60.00)
Glucose, Bld: 74 mg/dL (ref 70–99)
Potassium: 4.4 mEq/L (ref 3.5–5.1)
Sodium: 139 mEq/L (ref 135–145)
Total Bilirubin: 0.9 mg/dL (ref 0.2–1.2)
Total Protein: 6.9 g/dL (ref 6.0–8.3)

## 2022-05-22 LAB — CBC
HCT: 50.3 % (ref 39.0–52.0)
Hemoglobin: 16.8 g/dL (ref 13.0–17.0)
MCHC: 33.4 g/dL (ref 30.0–36.0)
MCV: 91.7 fl (ref 78.0–100.0)
Platelets: 177 10*3/uL (ref 150.0–400.0)
RBC: 5.49 Mil/uL (ref 4.22–5.81)
RDW: 13.5 % (ref 11.5–15.5)
WBC: 3.8 10*3/uL — ABNORMAL LOW (ref 4.0–10.5)

## 2022-05-22 LAB — LIPID PANEL
Cholesterol: 164 mg/dL (ref 0–200)
HDL: 38.7 mg/dL — ABNORMAL LOW (ref >39.00)
LDL Cholesterol: 111 mg/dL — ABNORMAL HIGH (ref 0–99)
NonHDL: 125.01
Total CHOL/HDL Ratio: 4
Triglycerides: 72 mg/dL (ref 0.0–149.0)
VLDL: 14.4 mg/dL (ref 0.0–40.0)

## 2022-05-22 NOTE — Assessment & Plan Note (Signed)
History of decreased GFR.  Patient is on Maxide diuretic pending labs today.

## 2022-05-22 NOTE — Assessment & Plan Note (Signed)
Patient currently maintained on amlodipine and Maxide.  Tolerates medications well.  Continue taking medication as prescribed.

## 2022-05-22 NOTE — Assessment & Plan Note (Signed)
Discussed age-appropriate immunizations and screening exams.  Did review Medicare annual wellness paperwork with patient in office completed it.  Sent to scanning to be a part of patient's medical record.

## 2022-05-22 NOTE — Patient Instructions (Signed)
Nice to see you today I will be in touch with the labs once I have the resutls Follow up with me in 1 year for your next wellness visit.

## 2022-05-22 NOTE — Progress Notes (Signed)
Established Patient Office Visit  Subjective   Patient ID: Brian Matthews, male    DOB: 10-13-1950  Age: 71 y.o. MRN: [id number]  Chief Complaint  Patient presents with   Medicare Wellness    HPI   Medicare annual wellness  Hearing Screening   '500Hz'$  '1000Hz'$  '2000Hz'$  '4000Hz'$   Right ear 20 0 40 0  Left ear 20 25 0 0  Comments: Pt has noticed decreased hearing in B ears, worse in L.  Also, c/o tinnitus.   Vision Screening - Comments:: Last eye exam, Sept/Oct 2023  Other providers: [name] Dr. Idamae Schuller   for complete physical and follow up of chronic conditions.  HTN: Tolerating medications well.  Blood pressure within normal limits in office today.  Patient does check blood pressure at home also within normal limits.   HLD: Patient is doing healthy lifestyle modifications inclusive of watching his diet and exercising.  Soreness in the neck and chest discomfort with off balance. States that it went away.  States that he was placing crown modeling around the house  Immunizations: -Tetanus: Needs updating -Influenza: 03/25/2022 -Shingles: 12/28/022, 08/07/2021 -Pneumonia:  UTD  -HPV: aged out  Diet: [name]. States that he is eating 2 small meals a day . Will have a healthy snack. States that he drinks mostly water. Some days he will have green tea or sweet tea (1/2 and 1/2) Exercise: Patient does [name] at home workouts along with running in place.  Anywhere from 70+ minutes a day 4-5 times a week.  Eye exam: Completes annually.  [facility] wears glasses Dental exam: Completes semi-annually  [name]  Colonoscopy: Completed in 05/15/2022, recall in 5 years 2028 Lung Cancer Screening: NA  Dexa: NA  PSA: Due, patient recently had prostate exam at urologist.  They did not draw PSA per his report.  Sleep: Goes to sleep around 1-130am and gets up around 7-8. Sleeps 7-8 hours. Feesls rested. Does not snore  Advanced directives: Patient  does have his sister is his POA, POD, H POA.     Review of Systems  Constitutional:  Negative for chills and fever.  Respiratory:  Negative for shortness of breath.   Cardiovascular:  Negative for chest pain.  Gastrointestinal:  Negative for abdominal pain, nausea and vomiting.  Genitourinary:  Negative for dysuria and hematuria.       Nocturia 1-2  Neurological:  Negative for headaches.  Psychiatric/Behavioral:  Negative for hallucinations and suicidal ideas.       Objective:     BP 122/80   Pulse 71   Temp (!) 97.5 F (36.4 C) (Temporal)   Ht 5' 11.75" (1.822 m)   Wt 165 lb 12.8 oz (75.2 kg)   SpO2 98%   BMI 22.64 kg/m    Physical Exam Vitals and nursing note reviewed.  Constitutional:      Appearance: Normal appearance.  HENT:     Right Ear: Tympanic membrane, ear canal and external ear normal.     Left Ear: Tympanic membrane, ear canal and external ear normal.     Mouth/Throat:     Mouth: Mucous membranes are moist.     Pharynx: Oropharynx is clear.  Eyes:     Extraocular Movements: Extraocular movements intact.     Pupils: Pupils are equal, round, and reactive to light.     Comments: Wears glasses  Cardiovascular:     Rate and Rhythm: Normal rate and regular rhythm.     Pulses: Normal  pulses.     Heart sounds: Normal heart sounds.  Pulmonary:     Effort: Pulmonary effort is normal.     Breath sounds: Normal breath sounds.  Abdominal:     General: Bowel sounds are normal.  Musculoskeletal:     Right lower leg: No edema.     Left lower leg: No edema.  Lymphadenopathy:     Cervical: No cervical adenopathy.  Skin:    General: Skin is warm.  Neurological:     General: No focal deficit present.     Mental Status: He is alert.  Psychiatric:        Mood and Affect: Mood normal.        Behavior: Behavior normal.        Thought Content: Thought content normal.        Judgment: Judgment normal.      No results found for any visits on  05/22/22.    The 10-year ASCVD risk score (Arnett DK, et al., 2019) is: 18.5%    Assessment & Plan:   Problem List Items Addressed This Visit       Cardiovascular and Mediastinum   Benign essential HTN    Patient currently maintained on amlodipine and Maxide.  Tolerates medications well.  Continue taking medication as prescribed.      Relevant Orders   CBC   Comprehensive metabolic panel     Digestive   GERD (gastroesophageal reflux disease)     Other   Medicare annual wellness visit, subsequent - Primary    Discussed age-appropriate immunizations and screening exams.  Did review Medicare annual wellness paperwork with patient in office completed it.  Sent to scanning to be a part of patient's medical record.      Hyperlipidemia    Patient leads a healthy lifestyle and has normal weight.  Pending labs      Relevant Orders   Lipid panel   Decreased GFR    History of decreased GFR.  Patient is on Maxide diuretic pending labs today.      Relevant Orders   Comprehensive metabolic panel   Other Visit Diagnoses     Screening for prostate cancer       Relevant Orders   PSA, Medicare       Return in about 1 year (around 05/23/2023) for CPE and labs.    Romilda Garret, NP

## 2022-05-22 NOTE — Assessment & Plan Note (Signed)
Patient leads a healthy lifestyle and has normal weight.  Pending labs

## 2022-08-04 ENCOUNTER — Ambulatory Visit (INDEPENDENT_AMBULATORY_CARE_PROVIDER_SITE_OTHER): Payer: Medicare Other | Admitting: Internal Medicine

## 2022-08-04 ENCOUNTER — Encounter: Payer: Self-pay | Admitting: Internal Medicine

## 2022-08-04 VITALS — BP 126/82 | HR 96 | Temp 98.1°F | Ht 71.75 in | Wt 171.0 lb

## 2022-08-04 DIAGNOSIS — I499 Cardiac arrhythmia, unspecified: Secondary | ICD-10-CM | POA: Insufficient documentation

## 2022-08-04 LAB — CBC
HCT: 47.7 % (ref 39.0–52.0)
Hemoglobin: 16 g/dL (ref 13.0–17.0)
MCHC: 33.6 g/dL (ref 30.0–36.0)
MCV: 91.5 fl (ref 78.0–100.0)
Platelets: 154 10*3/uL (ref 150.0–400.0)
RBC: 5.22 Mil/uL (ref 4.22–5.81)
RDW: 13.5 % (ref 11.5–15.5)
WBC: 3.9 10*3/uL — ABNORMAL LOW (ref 4.0–10.5)

## 2022-08-04 LAB — COMPREHENSIVE METABOLIC PANEL
ALT: 15 U/L (ref 0–53)
AST: 18 U/L (ref 0–37)
Albumin: 4.2 g/dL (ref 3.5–5.2)
Alkaline Phosphatase: 61 U/L (ref 39–117)
BUN: 10 mg/dL (ref 6–23)
CO2: 30 mEq/L (ref 19–32)
Calcium: 9.5 mg/dL (ref 8.4–10.5)
Chloride: 102 mEq/L (ref 96–112)
Creatinine, Ser: 1.16 mg/dL (ref 0.40–1.50)
GFR: 63.73 mL/min (ref >60.00)
Glucose, Bld: 84 mg/dL (ref 70–99)
Potassium: 4.4 mEq/L (ref 3.5–5.1)
Sodium: 139 mEq/L (ref 135–145)
Total Bilirubin: 0.8 mg/dL (ref 0.2–1.2)
Total Protein: 6.7 g/dL (ref 6.0–8.3)

## 2022-08-04 LAB — TSH: TSH: 4.13 u[IU]/mL (ref 0.35–5.50)

## 2022-08-04 NOTE — Assessment & Plan Note (Addendum)
Noted on his BP monitors He has some vague symptoms but not clearly palpitations Will check EKG  EKG shows sinus bradycardia--normal axis and intervals. No hypertrophy or ischemia----normal Since 4/23---- rate is slower (85----52)  Will check TSH and other labs --due to not feeling right If symptoms persist, will order Zio monitor (but I am not suspecting sig arrhythmia especially given his heavy work out load)

## 2022-08-04 NOTE — Progress Notes (Signed)
Subjective:    Patient ID: Brian Matthews, male    DOB: 1950-10-23, 72 y.o.   MRN: [id number]  HPI Here due to concern about an irregular heartbeat  Has mild HTN--on medication So he checks his BP at times--with home monitor (has 2 that he uses regularly) Has arm and wrist units Both were displaying "irregular" heartbeat---multiple times (but not every time) It had been very rare prior to this  Felt  a little funny Achy feeling in both chests--despite no new exercises Slight SOB --but not necessarily even at rest---but not limiting him with his exercise May have felt "different" but not sure if racing or palpitations No sig chest pain No dizziness or syncope  Will have glass of tea at times No other caffeine No new meds OTC  Current Outpatient Medications on File Prior to Visit  Medication Sig Dispense Refill   Alpha-Lipoic Acid 200 MG CAPS Take by mouth daily.     amLODipine (NORVASC) 5 MG tablet TAKE 1 TABLET(5 MG) BY MOUTH DAILY 90 tablet 1   Cholecalciferol (VITAMIN D-3) 125 MCG (5000 UT) TABS Take by mouth. 1 tablet 4-5 days per week     Collagen-Vitamin C (ULTRA COLLAGEN + C PO) Take by mouth daily.     Cranberry 500 MG TABS Take by mouth daily.     Magnesium 500 MG CAPS Take by mouth daily.     Misc Natural Products (GLUCOSAMINE CHOND COMPLEX/MSM) TABS Take by mouth daily.     Misc Natural Products (MENS PROSTATE HEALTH FORMULA PO) Take 1 tablet by mouth daily.     Multiple Vitamin (MULTIVITAMIN) tablet Take 1 tablet by mouth daily.     Omega-3 Fatty Acids (FISH OIL) 1200 MG CAPS Take by mouth. 1-2 per day     Plant Sterols and Stanols (CHOLESTOFF PO) Take 900 mg by mouth daily.     Saw Palmetto 450 MG CAPS Take 2 capsules by mouth daily.     triamterene-hydrochlorothiazide (MAXZIDE-25) 37.5-25 MG tablet TAKE 1 TABLET BY MOUTH DAILY 90 tablet 3   Turmeric (QC TUMERIC COMPLEX) 500 MG CAPS Take by mouth daily.     vitamin B-12 (CYANOCOBALAMIN) 500 MCG tablet Take 500  mcg by mouth daily. 1 tablet 4-5 days per week     No current facility-administered medications on file prior to visit.    Allergies  Allergen Reactions   Lisinopril Cough   Penicillins Other (See Comments)    Pt does not remember reaction    Past Medical History:  Diagnosis Date   Allergy    Broken wrist    Childhood asthma    Colon polyps    GERD (gastroesophageal reflux disease)    History of blood transfusion    Hx of adenomatous polyp of colon 04/29/2017   Hypertension    Hyperthyroidism    Positive TB test     Past Surgical History:  Procedure Laterality Date   COLONOSCOPY  04/2017   Dr Carlean Purl   SKIN BIOPSY     04/24/22   UPPER GASTROINTESTINAL ENDOSCOPY     [id number]    Family History  Problem Relation Age of Onset   Hypertension Mother    Arthritis Father        Rheumatoid Artritis   Heart Problems Father    Colon cancer Sister    Brain cancer Brother    Melanoma Brother    Thyroid disease Neg Hx    Esophageal cancer Neg Hx    Pancreatic cancer Neg  Hx    Rectal cancer Neg Hx    Stomach cancer Neg Hx    Crohn's disease Neg Hx    Colon polyps Neg Hx    Ulcerative colitis Neg Hx     Social History   Socioeconomic History   Marital status: Divorced    Spouse name: Not on file   Number of children: 0   Years of education: Not on file   Highest education level: Not on file  Occupational History   Occupation: Educational psychologist    Comment: Retired  Tobacco Use   Smoking status: Never    Passive exposure: Past (sister smoked 50 years ago)   Smokeless tobacco: Never  Vaping Use   Vaping Use: Never used  Substance and Sexual Activity   Alcohol use: No    Alcohol/week: 0.0 standard drinks of alcohol   Drug use: No   Sexual activity: Not on file  Other Topics Concern   Not on file  Social History Narrative   Right Handed    Lives in a two story home    Social Determinants of Health   Financial Resource Strain: Not on file  Food  Insecurity: Not on file  Transportation Needs: Not on file  Physical Activity: Not on file  Stress: Not on file  Social Connections: Not on file  Intimate Partner Violence: Not on file   Review of Systems Has noticed some trouble sleeping since this is happening---left leg feels restless at times, ?the aching Appetite is fine--no change. Mostly eats vegetarian    Objective:   Physical Exam Constitutional:      Appearance: Normal appearance.  Cardiovascular:     Rate and Rhythm: Normal rate.     Pulses: Normal pulses.     Heart sounds: No murmur heard.    No gallop.     Comments: Regular with what sounds like occasional skips (variation in R-R interval episodically) Pulmonary:     Effort: Pulmonary effort is normal.     Breath sounds: Normal breath sounds. No wheezing.  Abdominal:     Palpations: Abdomen is soft.     Tenderness: There is no abdominal tenderness.  Musculoskeletal:     Cervical back: Neck supple.     Right lower leg: No edema.     Left lower leg: No edema.  Lymphadenopathy:     Cervical: No cervical adenopathy.  Neurological:     Mental Status: He is alert.  Psychiatric:        Mood and Affect: Mood normal.        Behavior: Behavior normal.            Assessment & Plan:

## 2022-08-21 ENCOUNTER — Encounter: Payer: Self-pay | Admitting: Nurse Practitioner

## 2022-08-22 NOTE — Telephone Encounter (Signed)
Left message to return call to our office.  

## 2022-08-22 NOTE — Telephone Encounter (Signed)
I have set patient up for Monday. He has tried otc medications with no improvement. Has not tried heat or ice. Denies any bowel or bladder incontinence.

## 2022-08-22 NOTE — Telephone Encounter (Signed)
I will evaluated in office

## 2022-08-23 ENCOUNTER — Encounter (HOSPITAL_BASED_OUTPATIENT_CLINIC_OR_DEPARTMENT_OTHER): Payer: Self-pay | Admitting: Emergency Medicine

## 2022-08-23 ENCOUNTER — Other Ambulatory Visit: Payer: Self-pay

## 2022-08-23 ENCOUNTER — Emergency Department (HOSPITAL_BASED_OUTPATIENT_CLINIC_OR_DEPARTMENT_OTHER): Payer: Medicare Other

## 2022-08-23 ENCOUNTER — Emergency Department (HOSPITAL_BASED_OUTPATIENT_CLINIC_OR_DEPARTMENT_OTHER)
Admission: EM | Admit: 2022-08-23 | Discharge: 2022-08-23 | Disposition: A | Discharge status: Home / Self Care | Payer: Medicare Other | Attending: Emergency Medicine | Admitting: Emergency Medicine

## 2022-08-23 DIAGNOSIS — K769 Liver disease, unspecified: Secondary | ICD-10-CM | POA: Insufficient documentation

## 2022-08-23 DIAGNOSIS — Q6102 Congenital multiple renal cysts: Secondary | ICD-10-CM | POA: Diagnosis not present

## 2022-08-23 DIAGNOSIS — Z79899 Other long term (current) drug therapy: Secondary | ICD-10-CM | POA: Diagnosis not present

## 2022-08-23 DIAGNOSIS — I700 Atherosclerosis of aorta: Secondary | ICD-10-CM | POA: Insufficient documentation

## 2022-08-23 DIAGNOSIS — M545 Low back pain, unspecified: Secondary | ICD-10-CM | POA: Insufficient documentation

## 2022-08-23 DIAGNOSIS — I10 Essential (primary) hypertension: Secondary | ICD-10-CM | POA: Insufficient documentation

## 2022-08-23 LAB — CBC WITH DIFFERENTIAL/PLATELET
Abs Immature Granulocytes: 0.02 10*3/uL (ref 0.00–0.07)
Basophils Absolute: 0 10*3/uL (ref 0.0–0.1)
Basophils Relative: 0 %
Eosinophils Absolute: 0 10*3/uL (ref 0.0–0.5)
Eosinophils Relative: 1 %
HCT: 47.9 % (ref 39.0–52.0)
Hemoglobin: 16.8 g/dL (ref 13.0–17.0)
Immature Granulocytes: 0 %
Lymphocytes Relative: 11 %
Lymphs Abs: 0.6 10*3/uL — ABNORMAL LOW (ref 0.7–4.0)
MCH: 30.9 pg (ref 26.0–34.0)
MCHC: 35.1 g/dL (ref 30.0–36.0)
MCV: 88.1 fL (ref 80.0–100.0)
Monocytes Absolute: 0.5 10*3/uL (ref 0.1–1.0)
Monocytes Relative: 8 %
Neutro Abs: 4.7 10*3/uL (ref 1.7–7.7)
Neutrophils Relative %: 80 %
Platelets: 200 10*3/uL (ref 150–400)
RBC: 5.44 MIL/uL (ref 4.22–5.81)
RDW: 12.9 % (ref 11.5–15.5)
WBC: 5.9 10*3/uL (ref 4.0–10.5)
nRBC: 0 % (ref 0.0–0.2)

## 2022-08-23 LAB — URINALYSIS, ROUTINE W REFLEX MICROSCOPIC
Bilirubin Urine: NEGATIVE
Glucose, UA: NEGATIVE mg/dL
Hgb urine dipstick: NEGATIVE
Ketones, ur: NEGATIVE mg/dL
Leukocytes,Ua: NEGATIVE
Nitrite: NEGATIVE
Specific Gravity, Urine: 1.021 (ref 1.005–1.030)
pH: 8 (ref 5.0–8.0)

## 2022-08-23 LAB — BASIC METABOLIC PANEL
Anion gap: 9 (ref 5–15)
BUN: 16 mg/dL (ref 8–23)
CO2: 27 mmol/L (ref 22–32)
Calcium: 9.7 mg/dL (ref 8.9–10.3)
Chloride: 101 mmol/L (ref 98–111)
Creatinine, Ser: 1.18 mg/dL (ref 0.61–1.24)
GFR, Estimated: >60 mL/min (ref >60)
Glucose, Bld: 97 mg/dL (ref 70–99)
Potassium: 4.3 mmol/L (ref 3.5–5.1)
Sodium: 137 mmol/L (ref 135–145)

## 2022-08-23 LAB — LIPASE, BLOOD: Lipase: 31 U/L (ref 11–51)

## 2022-08-23 MED ORDER — KETOROLAC TROMETHAMINE 15 MG/ML IJ SOLN
15.0000 mg | Freq: Once | INTRAMUSCULAR | Status: AC
  Administered 2022-08-23: 15 mg via INTRAVENOUS
  Filled 2022-08-23: qty 1

## 2022-08-23 MED ORDER — IOHEXOL 300 MG/ML  SOLN
100.0000 mL | Freq: Once | INTRAMUSCULAR | Status: AC | PRN
  Administered 2022-08-23: 80 mL via INTRAVENOUS

## 2022-08-23 MED ORDER — ACETAMINOPHEN 500 MG PO TABS
1000.0000 mg | ORAL_TABLET | Freq: Once | ORAL | Status: AC
  Administered 2022-08-23: 1000 mg via ORAL
  Filled 2022-08-23: qty 2

## 2022-08-23 NOTE — ED Notes (Signed)
Discharge instructions, follow up care, and pain management reviewed and explained, pt verbalized understanding and had no further questions on d/c. Pt caox4 and ambulatory on d/c.  

## 2022-08-23 NOTE — ED Provider Notes (Signed)
[facility] Provider Note   CSN: [id number] Arrival date & time: 08/23/22  [id number]     History  Chief Complaint  Patient presents with   Back Pain    Brian Matthews is a 72 y.o. male with HTN, GERD, HLD, subacute thyroiditis presents with back pain.   Per chart review, patient messaged his doctor about left lower back pain on 3/7 reporting that he noticed he was leaning off to the right due to his left lower back pain.  He states that a few days prior to that he had some right leg pain that was not necessarily radicular from his lower back but just sore that went away.  He also reports mild intermittent generalized abdominal pain that is not present now.  No associated fevers chills, chest pain, shortness of breath, nausea vomiting diarrhea constipation, trauma.  No urinary symptoms. No h/o cancer or IVDU. He has had a few episodes of loose stool but no melena or hematochezia, no fecal incontinence or retention.  Patient states that he googled his symptoms and became very concerned that he may have pancreatitis or pancreatic cancer which is what made him present to the emergency department today.   Back Pain      Home Medications Prior to Admission medications   Medication Sig Start Date End Date Taking? Authorizing Provider  Alpha-Lipoic Acid 200 MG CAPS Take by mouth daily.    [provider]  amLODipine (NORVASC) 5 MG tablet TAKE 1 TABLET(5 MG) BY MOUTH DAILY 05/19/22   Michela Pitcher, NP  Cholecalciferol (VITAMIN D-3) 125 MCG (5000 UT) TABS Take by mouth. 1 tablet 4-5 days per week    [provider]  Collagen-Vitamin C (ULTRA COLLAGEN + C PO) Take by mouth daily.    [provider]  Cranberry 500 MG TABS Take by mouth daily.    [provider]  Magnesium 500 MG CAPS Take by mouth daily.    [provider]  Misc Natural Products (GLUCOSAMINE CHOND COMPLEX/MSM) TABS Take by mouth daily.     [provider]  Misc Natural Products ([place]) Take 1 tablet by mouth daily.    [provider]  Multiple Vitamin (MULTIVITAMIN) tablet Take 1 tablet by mouth daily.    [provider]  Omega-3 Fatty Acids (FISH OIL) 1200 MG CAPS Take by mouth. 1-2 per day    [provider]  Plant Sterols and Stanols (CHOLESTOFF PO) Take 900 mg by mouth daily.    [provider]  Saw Palmetto 450 MG CAPS Take 2 capsules by mouth daily.    [provider]  triamterene-hydrochlorothiazide (MAXZIDE-25) 37.5-25 MG tablet TAKE 1 TABLET BY MOUTH DAILY 02/18/22   Michela Pitcher, NP  Turmeric (QC TUMERIC COMPLEX) 500 MG CAPS Take by mouth daily.    [provider]  vitamin B-12 (CYANOCOBALAMIN) 500 MCG tablet Take 500 mcg by mouth daily. 1 tablet 4-5 days per week    [provider]      Allergies    Lisinopril and Penicillins    Review of Systems   Review of Systems  Musculoskeletal:  Positive for back pain.   Review of systems Negative for f/c.  A 10 point review of systems was performed and is negative unless otherwise reported in HPI.  Physical Exam Updated Vital Signs BP (!) 129/53   Pulse 62   Temp 98.2 F (36.8 C) (Oral)   Resp 16  Ht 5' 11.75" (1.822 m)   Wt 77.6 kg   SpO2 99%   BMI 23.35 kg/m  Physical Exam General: Normal appearing male, lying in bed.  HEENT: Sclera anicteric, MMM, trachea midline.  Cardiology: RRR, no murmurs/rubs/gallops. BL radial and DP pulses equal bilaterally.  Resp: Normal respiratory rate and effort. CTAB, no wheezes, rhonchi, crackles.  Abd: Soft, non-tender, non-distended. No rebound tenderness or guarding.  GU: Deferred. MSK: No peripheral edema or signs of trauma. Extremities without deformity or TTP. No cyanosis or clubbing. Skin: warm, dry. No rashes or lesions. Back: No midline T or L spine TTP, no stepoffs or signs of trauma. No TTP in L paraspinal lumbar area  or sacrum. No CVA tenderness Neuro: A&Ox4, CNs II-XII grossly intact. MAEs. Sensation grossly intact.  Psych: Normal mood and affect.   ED Results / Procedures / Treatments   Labs (all labs ordered are listed, but only abnormal results are displayed) Labs Reviewed  CBC WITH DIFFERENTIAL/PLATELET - Abnormal; Notable for the following components:      Result Value   Lymphs Abs 0.6 (*)    All other components within normal limits  URINALYSIS, ROUTINE W REFLEX MICROSCOPIC - Abnormal; Notable for the following components:   Protein, ur TRACE (*)    All other components within normal limits  LIPASE, BLOOD  BASIC METABOLIC PANEL    EKG None  Radiology CT ABDOMEN PELVIS W CONTRAST  Result Date: 08/23/2022 CLINICAL DATA:  Nonlocalized abdominal pain. EXAM: CT ABDOMEN AND PELVIS WITH CONTRAST TECHNIQUE: Multidetector CT imaging of the abdomen and pelvis was performed using the standard protocol following bolus administration of intravenous contrast. RADIATION DOSE REDUCTION: This exam was performed according to the departmental dose-optimization program which includes automated exposure control, adjustment of the mA and/or kV according to patient size and/or use of iterative reconstruction technique. CONTRAST:  31m OMNIPAQUE IOHEXOL 300 MG/ML  SOLN COMPARISON:  None Available. FINDINGS: Lower chest: Unremarkable. Hepatobiliary: 7 image are hypervascular lesion in the right liver (25/2) is too small to characterize but likely benign etiology such as flash filling hemangioma or vascular malformation. Hypervascular lesion medial right liver on 31/2 is probably hemangioma although this lesion cannot be definitively characterized based on enhancement characteristics. A third 13 mm lesion in the medial right liver on 19/2 is likely hemangioma. 14 mm cavernous hemangioma in the lateral segment left liver demonstrates characteristic peripheral nodular enhancement on coronal image 65/5. There is no evidence  for gallstones, gallbladder wall thickening, or pericholecystic fluid. No intrahepatic or extrahepatic biliary dilation. Pancreas: No focal mass lesion. No dilatation of the main duct. No intraparenchymal cyst. No peripancreatic edema. Spleen: No splenomegaly. No focal mass lesion. Adrenals/Urinary Tract: No adrenal nodule or mass. Right kidney unremarkable. Multiple small Bosniak I and II cysts are seen in the left kidney including a dominant Bosniak I cyst lower pole measuring 8.1 cm. Additional tiny hypodensities in the left kidney are too small to characterize but likely benign. No followup imaging is recommended. No evidence for hydroureter. The urinary bladder appears normal for the degree of distention. Stomach/Bowel: Stomach is unremarkable. No gastric wall thickening. No evidence of outlet obstruction. Duodenum is normally positioned as is the ligament of Treitz. No small bowel wall thickening. No small bowel dilatation. The terminal ileum is normal. The appendix is normal. No gross colonic mass. No colonic wall thickening. Vascular/Lymphatic: There is mild atherosclerotic calcification of the abdominal aorta without aneurysm. There is no gastrohepatic or hepatoduodenal ligament lymphadenopathy. No retroperitoneal or mesenteric  lymphadenopathy. No pelvic sidewall lymphadenopathy. Reproductive: The prostate gland and seminal vesicles are unremarkable. Other: No intraperitoneal free fluid. Musculoskeletal: No worrisome lytic or sclerotic osseous abnormality. IMPRESSION: 1. No acute findings in the abdomen or pelvis. Specifically, no findings to explain the patient's history of abdominal pain. 2. Multiple small Bosniak I and II cysts in the left kidney. No followup imaging is recommended. 3. Multiple small hypervascular liver lesions, likely hemangiomas although several of these cannot be definitively characterized based on enhancement characteristics. Follow-up outpatient non emergent MRI abdomen with and  without contrast recommended after resolution of acute symptoms when patient is best able to participate in positioning and breath holding. 4.  Aortic Atherosclerosis (ICD10-I70.0). Electronically Signed   By: Misty Stanley M.D.   On: 08/23/2022 12:43    Procedures Procedures    Medications Ordered in ED Medications  ketorolac (TORADOL) 15 MG/ML injection 15 mg (has no administration in time range)  acetaminophen (TYLENOL) tablet 1,000 mg (1,000 mg Oral Given 08/23/22 1114)  iohexol (OMNIPAQUE) 300 MG/ML solution 100 mL (80 mLs Intravenous Contrast Given 08/23/22 1223)    ED Course/ Medical Decision Making/ A&P                          Medical Decision Making Amount and/or Complexity of Data Reviewed Labs: ordered. Radiology: ordered. Decision-making details documented in ED Course.  Risk OTC drugs. Prescription drug management.    This patient presents to the ED for concern of left lower back pain as well as intermittent abdominal pain, this involves an extensive number of treatment options, and is a complaint that carries with it a high risk of complications and morbidity.  I considered the following differential and admission for this acute, potentially life threatening condition.   MDM:    For patient's left lower back pain, consider MSK pain lumbar strain or sprain as most likely cause, though he has no tenderness palpation in that area.  No CVA tenderness or urinary symptoms to indicate pyelonephritis.  He does report intermittent abdominal pain though has no pain at this current moment and is very concerned about pancreatitis versus pancreatic cancer.  He has no abdominal tenderness palpation at all, very benign abdominal exam, low concern overall for intra-abdominal pathology though patient states he had had an elevated lipase in the past.  Patient does also report some right leg pain that happened a couple of weeks ago that seem to coincide with the beginning of his back pain, he  does have a history of hypertension and for this reason aortic syndrome is also a pathology.  Will obtain a CT abdomen pelvis to further evaluate. . No trauma, no concern for lumbar spinal fracture no midline tenderness palpation.  No back pain red flags in history or physical exam.  No radicular pain or neuro symptoms, no c/f spinal canal stenosis or cauda equina.  No fevers or chills, no focal neurodeficits, no concern for spinal epidural abscess, transverse myelitis.   Clinical Course as of 08/23/22 1303  Sat Aug 23, 2022  1153 BMP, CBC, lipase, and UA without significant abnormality [HN]  1249 CT ABDOMEN PELVIS W CONTRAST IMPRESSION: 1. No acute findings in the abdomen or pelvis. Specifically, no findings to explain the patient's history of abdominal pain. 2. Multiple small Bosniak I and II cysts in the left kidney. No followup imaging is recommended. 3. Multiple small hypervascular liver lesions, likely hemangiomas although several of these cannot be definitively characterized based  on enhancement characteristics. Follow-up outpatient non emergent MRI abdomen with and without contrast recommended after resolution of acute symptoms when patient is best able to participate in positioning and breath holding. 4.  Aortic Atherosclerosis (ICD10-I70.0).   [HN]    Clinical Course User Index [HN] Audley Hose, MD    Labs: I Ordered, and personally interpreted labs.  The pertinent results include:  those listed above  Imaging Studies ordered: I ordered imaging studies including CT A/P I independently visualized and interpreted imaging. I agree with the radiologist interpretation  Additional history obtained from chart review.   Reevaluation: After the interventions noted above, I reevaluated the patient and found that they have :improved  Social Determinants of Health: Patient lives independently   Disposition:  Patient with very reassuring w/u. No acute findings on CT  abd/pelvis, normal labs. Patient is informed of his likely hemangiomas and recommendation for o/p abd MRI. Instructed to f/u with PCP in 1-2 weeks. Instructed to take tylenol for pain, heat/ice, stretch/rest, and use OTC lidocaine patches. Likely w/ lumbar strain/sprain.  DC w/ discharge instructions/return precautions. All questions answered to patient's satisfaction.    Co morbidities that complicate the patient evaluation  Past Medical History:  Diagnosis Date   Allergy    Broken wrist    Childhood asthma    Colon polyps    GERD (gastroesophageal reflux disease)    History of blood transfusion    Hx of adenomatous polyp of colon 04/29/2017   Hypertension    Hyperthyroidism    Positive TB test      Medicines Meds ordered this encounter  Medications   acetaminophen (TYLENOL) tablet 1,000 mg   iohexol (OMNIPAQUE) 300 MG/ML solution 100 mL   ketorolac (TORADOL) 15 MG/ML injection 15 mg    I have reviewed the patients home medicines and have made adjustments as needed  Problem List / ED Course: Problem List Items Addressed This Visit   None Visit Diagnoses     Acute left-sided low back pain without sciatica    -  Primary   Relevant Medications   acetaminophen (TYLENOL) tablet 1,000 mg (Completed)   ketorolac (TORADOL) 15 MG/ML injection 15 mg                   This note was created using dictation software, which may contain spelling or grammatical errors.    Audley Hose, MD 08/23/22 [phone]

## 2022-08-23 NOTE — ED Notes (Signed)
Patient transported to CT 

## 2022-08-23 NOTE — ED Triage Notes (Signed)
Pt arrived POV, caox4, ambulatory. Pt c/o L lower back pain x1 wk. Pain described pain as an ache, feels when "bending over" and improves "when walking." Pt denies urinary s/s. Pt also c/o having frequent loose BM since the back pain started.

## 2022-08-23 NOTE — Discharge Instructions (Addendum)
Thank you for coming to [facility] Emergency Department. You were seen for lower back pain. We did an exam, labs, and imaging, and these showed no acute findings.  You likely have a lower back strain.  You can take '650mg'$  tylenol (acetaminophen) every 4-6 hours for pain. Please use ice packs or heat packs, stretching, and over the counter lidocaine patches for your pain.   Your CT did show small lesions in your liver which are likely benign growths of blood vessels called hemangiomas. These are better evaluated by MRI, and we recommend an outpatient MRI for follow-up.   Please follow up with your primary care provider within 1-2 weeks.    Return to the ED if you develop any of the following: - Fever (100.4 F or 38 C) or chills at home that do not respond to over the counter medications - Weakness, numbness, or tingling in your extremities - Difficulty emptying bladder / urinary incontinence - Fecal incontinence - Uncontrolled nausea/vomiting with inability to keep down liquids - Feeling as though you are going to pass out or passing out - Anything else that concerns you

## 2022-08-25 ENCOUNTER — Ambulatory Visit: Payer: Medicare Other | Admitting: Nurse Practitioner

## 2022-08-26 ENCOUNTER — Telehealth: Payer: Self-pay

## 2022-08-26 NOTE — Transitions of Care (Post Inpatient/ED Visit) (Signed)
   08/26/2022  Name: Brian Matthews MRN: [id number] DOB: 1950/08/26  Today's TOC FU Call Status: Today's TOC FU Call Status:: Successful TOC FU Call Competed TOC FU Call Complete Date: 08/26/22  Transition Care Management Follow-up Telephone Call Date of Discharge: 08/23/22 Discharge Facility: [facility] ([facility]) Type of Discharge: Emergency Department Reason for ED Visit: Orthopedic Conditions (back pain) How have you been since you were released from the hospital?: Same Any questions or concerns?: No  Items Reviewed: Did you receive and understand the discharge instructions provided?: Yes Medications obtained and verified?: Yes (Medications Reviewed) Any new allergies since your discharge?: No Dietary orders reviewed?: NA Do you have support at home?: No People in Home: alone  Home Care and Equipment/Supplies: [place] Ordered?: NA Any new equipment or medical supplies ordered?: NA  Functional Questionnaire: Do you need assistance with bathing/showering or dressing?: No Do you need assistance with meal preparation?: No Do you need assistance with eating?: No Do you have difficulty maintaining continence: No Do you need assistance with getting out of bed/getting out of a chair/moving?: No Do you have difficulty managing or taking your medications?: No  Folllow up appointments reviewed: PCP Follow-up appointment confirmed?: No (declined will call for appointment if not any better soon) MD Provider Line Number:[phone] Given: Yes [facility] Follow-up appointment confirmed?: NA Do you need transportation to your follow-up appointment?: No Do you understand care options if your condition(s) worsen?: Yes-patient verbalized understanding    [place], [place]

## 2022-08-26 NOTE — Transitions of Care (Post Inpatient/ED Visit) (Signed)
   08/26/2022  Name: Brian Matthews MRN: [id number] DOB: 06-11-1951  Today's TOC FU Call Status: Today's TOC FU Call Status:: Unsuccessul Call (1st Attempt)  Attempted to reach the patient regarding the most recent Inpatient/ED visit.  Follow Up Plan: Additional outreach attempts will be made to reach the patient to complete the Transitions of Care (Post Inpatient/ED visit) call.   Signature Francella Solian, CMA

## 2022-09-08 ENCOUNTER — Encounter: Payer: Self-pay | Admitting: Nurse Practitioner

## 2022-11-08 IMAGING — US US ABDOMEN COMPLETE
1 series · 13 of 25 positions shown · non-contrast
Comparison: None.

CLINICAL DATA: Elevated lipase, left upper quadrant pain

EXAM:
ABDOMEN ULTRASOUND COMPLETE

[Series 1: us abdomen complete · 0.15mm/px · 13 of 111 slices shown]
[im 1/111]
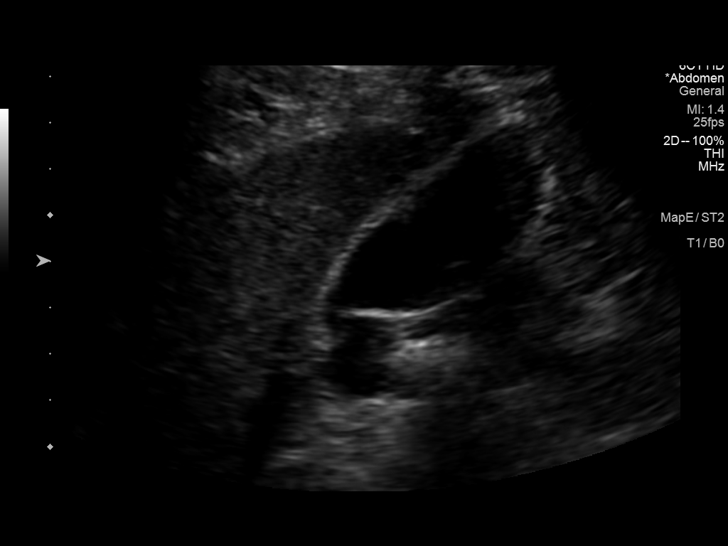
[im 10/111]
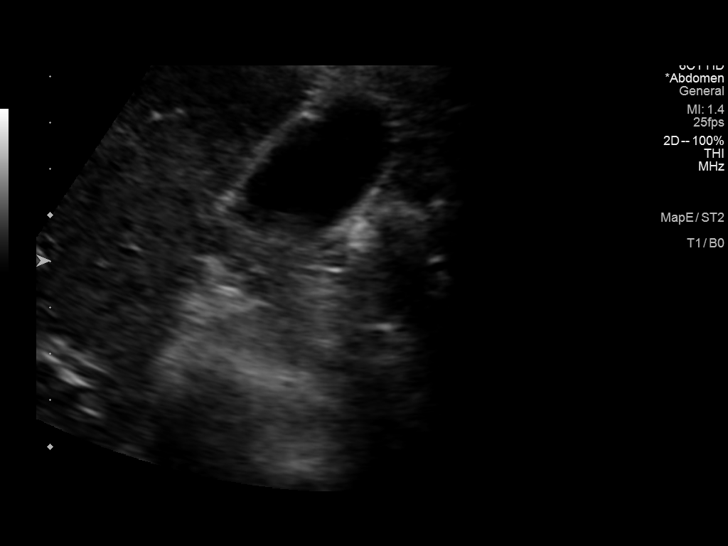
[im 19/111]
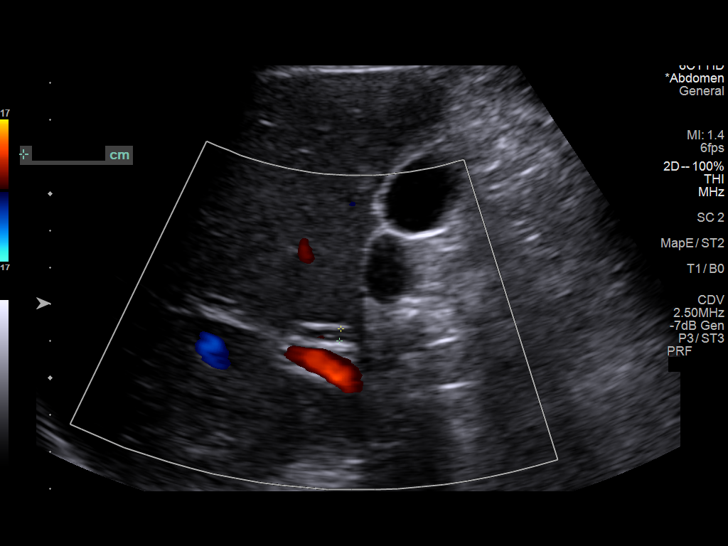
[im 28/111]
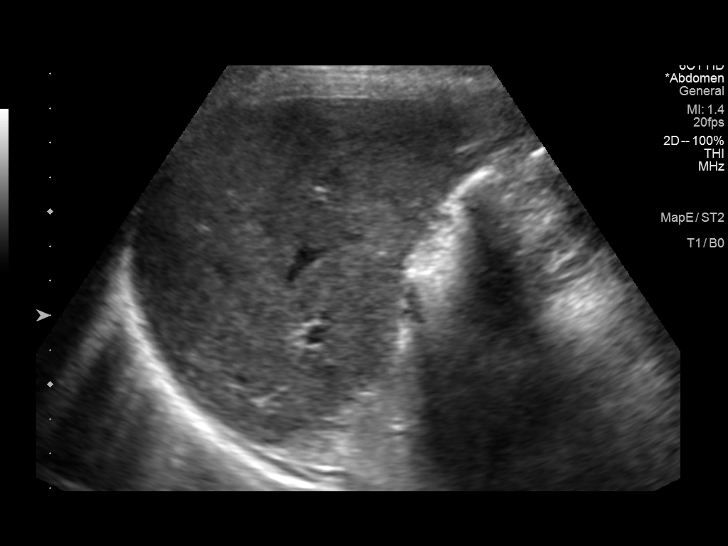
[im 37/111]
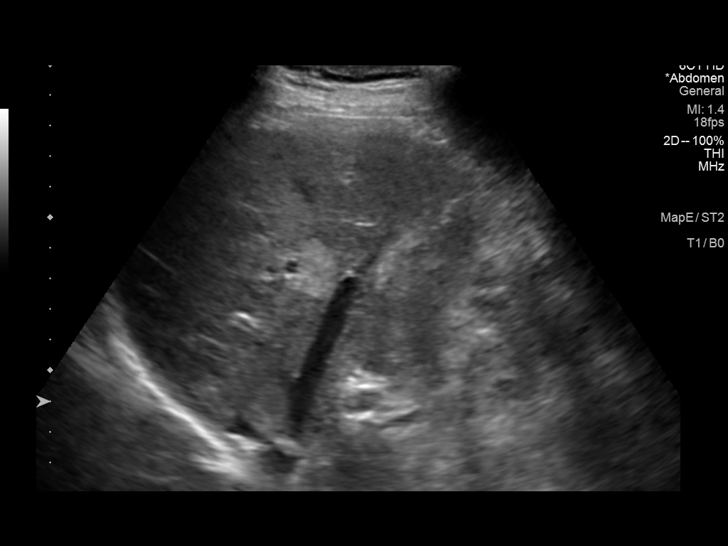
[im 46/111]
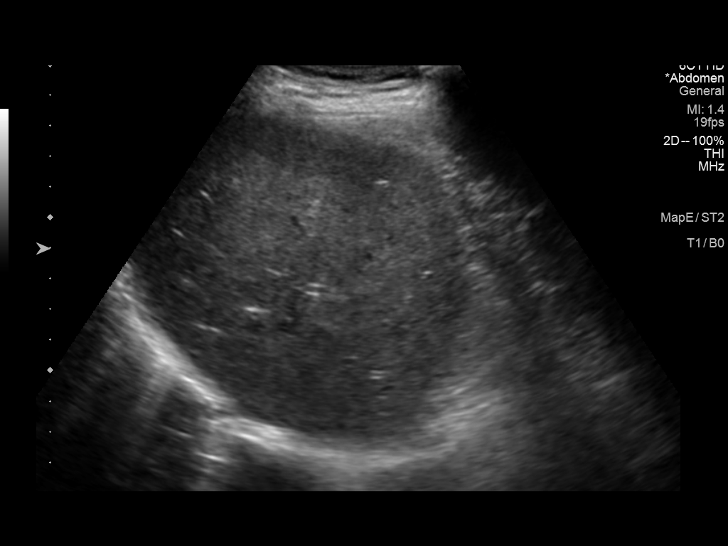
[im 56/111]
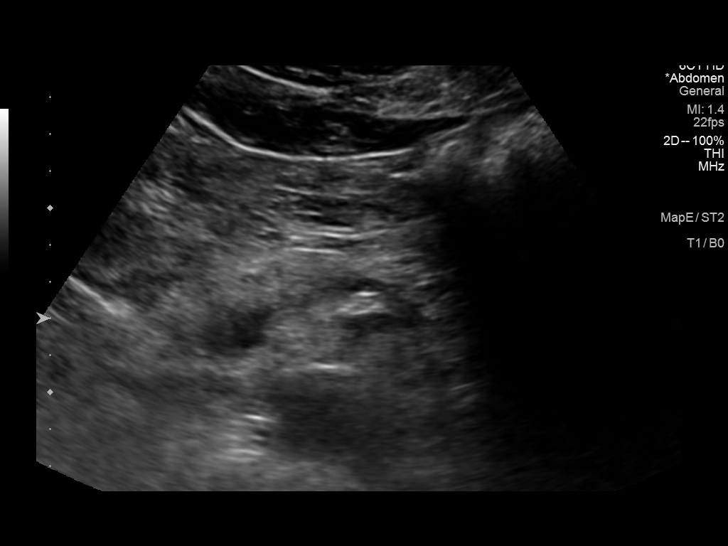
[im 65/111]
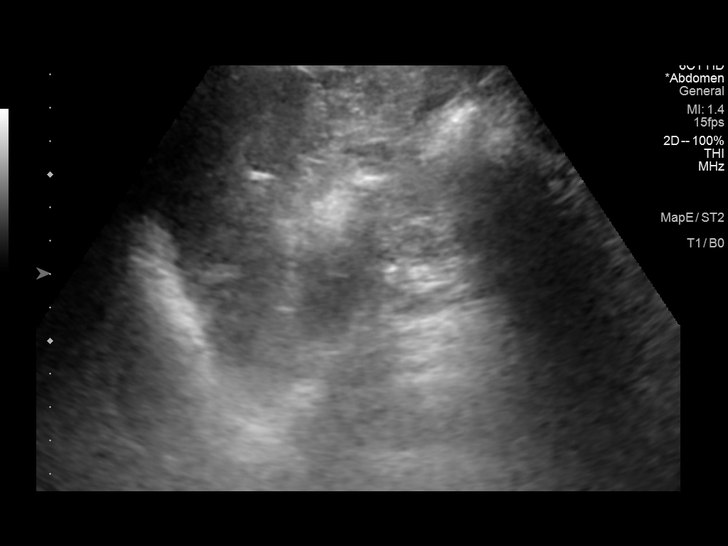
[im 74/111]
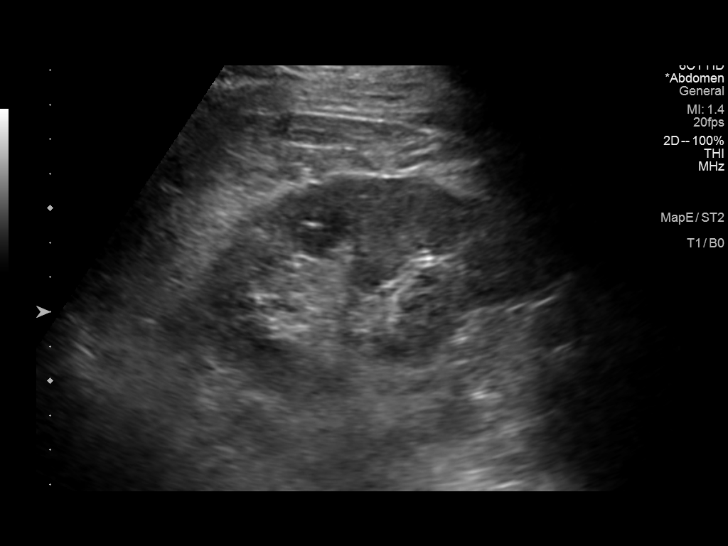
[im 83/111]
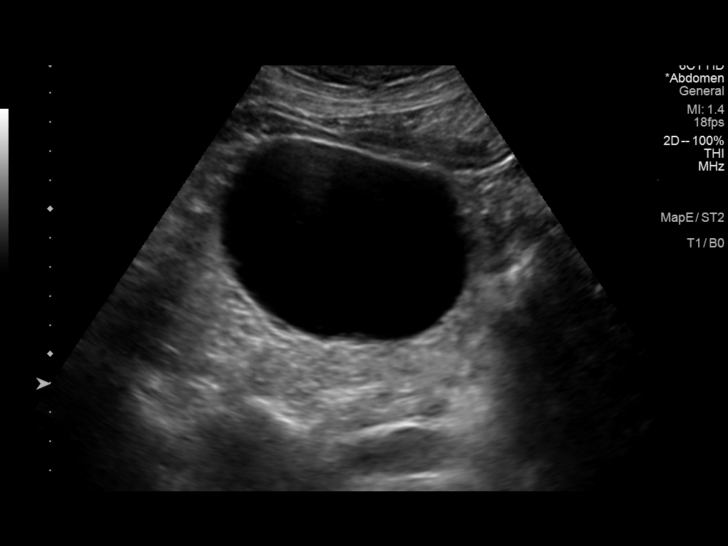
[im 92/111]
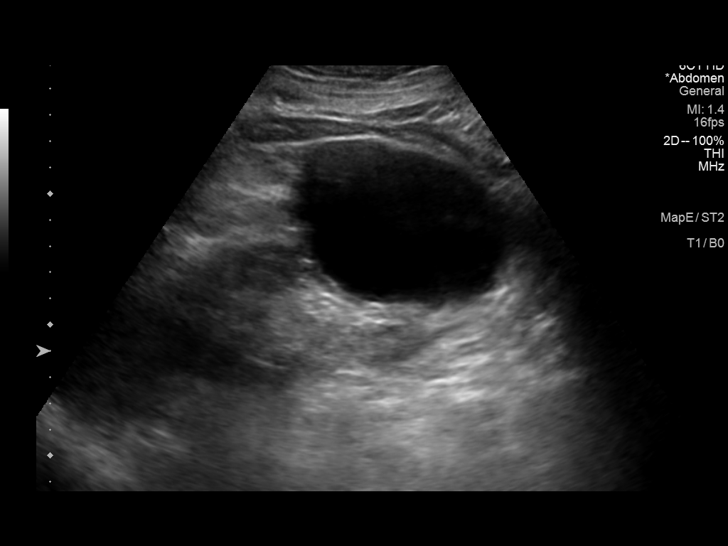
[im 101/111]
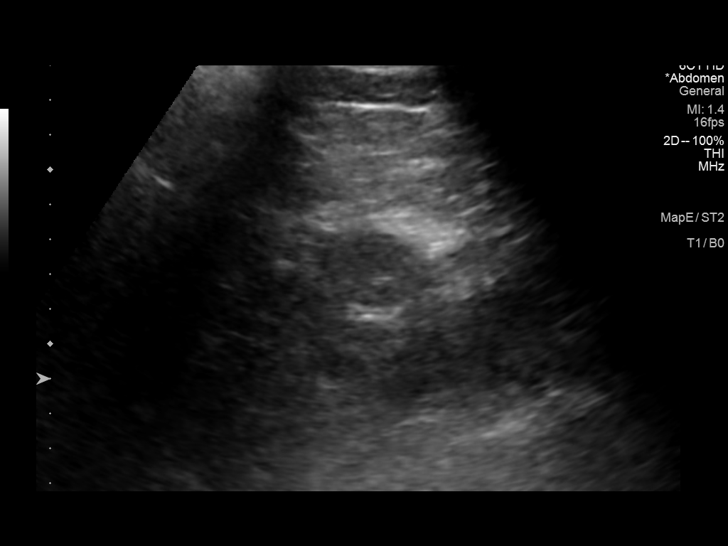
[im 111/111]
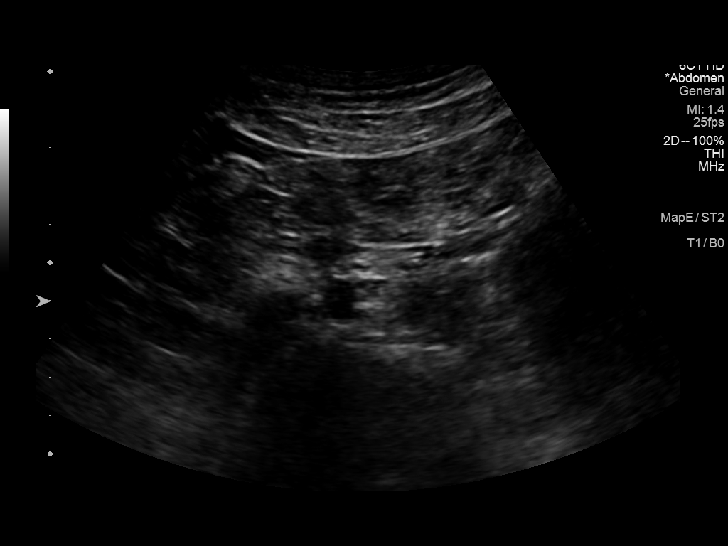

[13 of 25 positions shown; findings below may reference images not displayed]

FINDINGS: Gallbladder: No gallstones or wall thickening visualized. No
sonographic Murphy sign noted by sonographer.

Common bile duct: Diameter: 3.3 mm

Liver: Multiple echogenic solid masses within the liver. Posterior
right hepatic lobe lesion measures 2.4 x 2.6 x 2.8 cm. Cyst central
right hepatic lobe lesion measures 1.9 x 2 x 1.4 cm. Small echogenic
mass in the anterior right hepatic lobe measures 9 x 8.9 mm. Portal
vein is patent on color Doppler imaging with normal direction of
blood flow towards the liver.

IVC: No abnormality visualized.

Pancreas: Visualized portion unremarkable.

Spleen: Size and appearance within normal limits.

Right Kidney: Length: 9.9 cm. Echogenicity within normal limits. No
mass or hydronephrosis visualized.

Left Kidney: Length: 10.9 cm. Cortical echogenicity is normal. No
hydronephrosis. Multiple renal cysts. Largest are seen at the lower
pole measuring 7.4 x 7.6 x 8.4 cm and at the upper pole measuring
6.2 x 4.3 x 3.9 cm.

Abdominal aorta: No aneurysm visualized.

Other findings: None.
IMPRESSION: 1. Multiple solid echogenic liver masses, probably represent
hemangiomas.
2. Multiple left renal cysts.

## 2022-11-15 ENCOUNTER — Other Ambulatory Visit: Payer: Self-pay | Admitting: Nurse Practitioner

## 2022-11-15 DIAGNOSIS — I10 Essential (primary) hypertension: Secondary | ICD-10-CM

## 2022-12-15 ENCOUNTER — Encounter (HOSPITAL_COMMUNITY): Payer: Self-pay

## 2022-12-15 ENCOUNTER — Telehealth: Payer: Self-pay | Admitting: Nurse Practitioner

## 2022-12-15 ENCOUNTER — Other Ambulatory Visit: Payer: Self-pay

## 2022-12-15 ENCOUNTER — Encounter: Payer: Self-pay | Admitting: Nurse Practitioner

## 2022-12-15 ENCOUNTER — Emergency Department (HOSPITAL_COMMUNITY)
Admission: EM | Admit: 2022-12-15 | Discharge: 2022-12-15 | Disposition: A | Discharge status: Home / Self Care | Payer: Medicare Other | Attending: Emergency Medicine | Admitting: Emergency Medicine

## 2022-12-15 DIAGNOSIS — Z79899 Other long term (current) drug therapy: Secondary | ICD-10-CM | POA: Diagnosis not present

## 2022-12-15 DIAGNOSIS — I10 Essential (primary) hypertension: Secondary | ICD-10-CM | POA: Insufficient documentation

## 2022-12-15 DIAGNOSIS — M7918 Myalgia, other site: Secondary | ICD-10-CM | POA: Diagnosis not present

## 2022-12-15 DIAGNOSIS — R42 Dizziness and giddiness: Secondary | ICD-10-CM | POA: Diagnosis present

## 2022-12-15 DIAGNOSIS — I498 Other specified cardiac arrhythmias: Secondary | ICD-10-CM | POA: Diagnosis not present

## 2022-12-15 LAB — CBC
HCT: 47.1 % (ref 39.0–52.0)
Hemoglobin: 16.2 g/dL (ref 13.0–17.0)
MCH: 31.1 pg (ref 26.0–34.0)
MCHC: 34.4 g/dL (ref 30.0–36.0)
MCV: 90.4 fL (ref 80.0–100.0)
Platelets: 150 10*3/uL (ref 150–400)
RBC: 5.21 MIL/uL (ref 4.22–5.81)
RDW: 12.7 % (ref 11.5–15.5)
WBC: 4.2 10*3/uL (ref 4.0–10.5)
nRBC: 0 % (ref 0.0–0.2)

## 2022-12-15 LAB — URINALYSIS, ROUTINE W REFLEX MICROSCOPIC
Bilirubin Urine: NEGATIVE
Glucose, UA: NEGATIVE mg/dL
Hgb urine dipstick: NEGATIVE
Ketones, ur: NEGATIVE mg/dL
Leukocytes,Ua: NEGATIVE
Nitrite: NEGATIVE
Protein, ur: NEGATIVE mg/dL
Specific Gravity, Urine: 1.018 (ref 1.005–1.030)
pH: 7 (ref 5.0–8.0)

## 2022-12-15 LAB — BASIC METABOLIC PANEL
Anion gap: 9 (ref 5–15)
BUN: 14 mg/dL (ref 8–23)
CO2: 25 mmol/L (ref 22–32)
Calcium: 8.9 mg/dL (ref 8.9–10.3)
Chloride: 104 mmol/L (ref 98–111)
Creatinine, Ser: 1.13 mg/dL (ref 0.61–1.24)
GFR, Estimated: >60 mL/min (ref >60)
Glucose, Bld: 93 mg/dL (ref 70–99)
Potassium: 3.8 mmol/L (ref 3.5–5.1)
Sodium: 138 mmol/L (ref 135–145)

## 2022-12-15 LAB — CBG MONITORING, ED: Glucose-Capillary: 81 mg/dL (ref 70–99)

## 2022-12-15 NOTE — ED Triage Notes (Signed)
Pt states that he has been having lightheadedness, ache in BIL forearm and back and neck, and intermittent lump in throat x1 month. Pt seen at PCP and sent for further testing. Ambulatory to triage, airway patent. No SHOB.

## 2022-12-15 NOTE — Telephone Encounter (Signed)
Per access note pt agreed to go to [facility] ED. Sending note to Audria Nine NP.

## 2022-12-15 NOTE — ED Provider Notes (Signed)
[facility] AT [facility] Provider Note   CSN: [id number] Arrival date & time: 12/15/22  1422     History  Chief Complaint  Patient presents with   Weakness    Brian Matthews is a 72 y.o. male with past medical history significant for hypertension, hyperthyroidism, GERD, hyperlipidemia presents to the ED by recommendation of RN triage line at his PCP office.  Patient states he called to make an appointment for ongoing symptoms of intermittent body aches, lightheadedness, irregular heart beat and "throat fullness" and was directed to triage line.  Patient states he does have appointment tomorrow with PCP to discuss his concerns.  Patient states "I did not want to come to the hospital but the triage nurse seemed to insist I do".  He states he is currently feeling well and has no subjective complaint.  Denies chest pain, shortness of breath, trouble swallowing, chest tightness, leg swelling, palpitations, nausea, vomiting, weakness, headache, syncope.  Patient reports he lives an active lifestyle and is compliant with his BP medication.       Home Medications Prior to Admission medications   Medication Sig Start Date End Date Taking? Authorizing Provider  Alpha-Lipoic Acid 200 MG CAPS Take by mouth daily.    [provider]  amLODipine (NORVASC) 5 MG tablet TAKE 1 TABLET(5 MG) BY MOUTH DAILY 11/17/22   Eden Emms, NP  Cholecalciferol (VITAMIN D-3) 125 MCG (5000 UT) TABS Take by mouth. 1 tablet 4-5 days per week    [provider]  Collagen-Vitamin C (ULTRA COLLAGEN + C PO) Take by mouth daily.    [provider]  Cranberry 500 MG TABS Take by mouth daily.    [provider]  Magnesium 500 MG CAPS Take by mouth daily.    [provider]  Misc Natural Products (GLUCOSAMINE CHOND COMPLEX/MSM) TABS Take by mouth daily.    [provider]  Misc Natural Products (MENS PROSTATE HEALTH FORMULA PO) Take 1  tablet by mouth daily.    [provider]  Multiple Vitamin (MULTIVITAMIN) tablet Take 1 tablet by mouth daily.    [provider]  Omega-3 Fatty Acids (FISH OIL) 1200 MG CAPS Take by mouth. 1-2 per day    [provider]  Plant Sterols and Stanols (CHOLESTOFF PO) Take 900 mg by mouth daily.    [provider]  Saw Palmetto 450 MG CAPS Take 2 capsules by mouth daily.    [provider]  triamterene-hydrochlorothiazide (MAXZIDE-25) 37.5-25 MG tablet TAKE 1 TABLET BY MOUTH DAILY 02/18/22   Eden Emms, NP  Turmeric (QC TUMERIC COMPLEX) 500 MG CAPS Take by mouth daily.    [provider]  vitamin B-12 (CYANOCOBALAMIN) 500 MCG tablet Take 500 mcg by mouth daily. 1 tablet 4-5 days per week    [provider]      Allergies    Lisinopril and Penicillins    Review of Systems   Review of Systems  HENT:  Negative for sore throat and trouble swallowing.   Respiratory:  Negative for chest tightness and shortness of breath.   Cardiovascular:  Negative for chest pain, palpitations and leg swelling.       Irregular heartbeat   Gastrointestinal:  Negative for nausea and vomiting.  Musculoskeletal:  Positive for myalgias.  Neurological:  Positive for light-headedness. Negative for syncope, weakness and headaches.    Physical Exam Updated Vital Signs BP 126/72 (BP Location: Right Arm)   Pulse (!) 57  Temp 97.7 F (36.5 C) (Oral)   Resp 18   Ht 6' (1.829 m)   Wt 76.2 kg   SpO2 98%   BMI 22.78 kg/m  Physical Exam Vitals and nursing note reviewed.  Constitutional:      General: He is not in acute distress.    Appearance: Normal appearance. He is not ill-appearing or diaphoretic.  Cardiovascular:     Rate and Rhythm: Normal rate and regular rhythm.     Pulses: Normal pulses.     Heart sounds: Normal heart sounds. No murmur heard. Pulmonary:     Effort: Pulmonary effort is normal. No tachypnea or respiratory distress.      Breath sounds: Normal breath sounds.  Musculoskeletal:     Right lower leg: No edema.     Left lower leg: No edema.  Skin:    General: Skin is warm and dry.     Capillary Refill: Capillary refill takes less than 2 seconds.  Neurological:     Mental Status: He is alert. Mental status is at baseline.     GCS: GCS eye subscore is 4. GCS verbal subscore is 5. GCS motor subscore is 6.  Psychiatric:        Mood and Affect: Mood normal.        Behavior: Behavior normal.     ED Results / Procedures / Treatments   Labs (all labs ordered are listed, but only abnormal results are displayed) Labs Reviewed  BASIC METABOLIC PANEL  CBC  URINALYSIS, ROUTINE W REFLEX MICROSCOPIC  CBG MONITORING, ED    EKG EKG Interpretation Date/Time:  Monday December 15 2022 15:16:34 EDT Ventricular Rate:  61 PR Interval:  160 QRS Duration:  84 QT Interval:  400 QTC Calculation: 402 R Axis:   79  Text Interpretation: Normal sinus rhythm with sinus arrhythmia Normal ECG When compared with ECG of 01-Oct-2021 23:55, No significant change since last tracing Confirmed by Alvino Blood ([id number]) on 12/15/2022 6:20:32 PM  Radiology No results found.  Procedures Procedures    Medications Ordered in ED Medications - No data to display  ED Course/ Medical Decision Making/ A&P                             Medical Decision Making Amount and/or Complexity of Data Reviewed Labs: ordered.   This patient presents to the ED with chief complaint(s) of myalgias, lightheadedness, intermittent globus sensation with pertinent past medical history of hypertension, hyperlipidemia, hyperthyroidism.  The complaint involves an extensive differential diagnosis and also carries with it a high risk of complications and morbidity.    The differential diagnosis includes anemia, infectious etiology, metabolic derangement, hypoglycemia, hyper/hypothyroidism, cardiac dysrhythmia   The initial plan is to obtain baseline labs,  ECG, UA  Additional history obtained: Records reviewed  - patient contacted his PCP office today and scheduled appointment.  Patient spoke with RN triage line who recommended ED evaluation.  Per triage line note, patient had multiple complaints that have been "off and on for years" but he has noticed them more lately.  Initial Assessment:   Exam significant for overall well appearing patient who is not in acute distress.  Patient is moving all extremities appropriately and has 5/5 strength in BUE and BLE.  Sensation is grossly intact.  Heart rate is normal around 60 with regular rhythm, no murmurs.  Lungs clear to auscultation bilaterally with adequate tidal volume.  Skin is warm and dry.  Patient is able to change positions from lying to sitting without assistance and without lightheadedness or dizziness.    Independent ECG/labs interpretation:  The following labs were independently interpreted:  CBC without anemia or leukocytosis.  Metabolic panel without electrolyte disturbance.  Renal function normal.  UA without abnormality or infection.  ECG demonstrates normal sinus rhythm with sinus arrhythmia, no evidence of ischemia or infarction.   Disposition:   Discussed workup with patient, which is overall very reassuring.  No identifiable cause for patient's symptoms.  He reports he currently feels well.  Recommended patient follow up with his PCP tomorrow as scheduled.  The patient has been appropriately medically screened and/or stabilized in the ED. I have low suspicion for any other emergent medical condition which would require further screening, evaluation or treatment in the ED or require inpatient management. At time of discharge the patient is hemodynamically stable and in no acute distress. I have discussed work-up results and diagnosis with patient and answered all questions. Patient is agreeable with discharge plan. We discussed strict return precautions for returning to the emergency  department and they verbalized understanding.           Final Clinical Impression(s) / ED Diagnoses Final diagnoses:  Sinus arrhythmia  Intermittent lightheadedness  Myalgia, multiple sites    Rx / DC Orders ED Discharge Orders     None         Lenard Simmer, PA-C 12/15/22 1934    Lonell Grandchild, MD 12/16/22 1201

## 2022-12-15 NOTE — Discharge Instructions (Addendum)
Thank you for allowing me to be a part of your care today.    Your workup in the ED is overall reassuring.  Your electrolytes, blood counts, and urine are all normal.  Your EKG did show sinus arrhythmia, which is common and related to your breathing (this is a normal finding).    Please keep your primary care doctor appointment tomorrow.  Return to the ED if you develop sudden worsening of your symptoms or if you have new concerns.

## 2022-12-15 NOTE — Telephone Encounter (Signed)
Pt called stating he has been experiencing some "Occasional lightheadedness, some mild tiredness, sometimes a mild achiness in arm and upper middle of back and neck and an occasional lump in throat sensation". Pt states he believes it might be heart related. Scheduled appt with Chestine Spore on tomorrow, 7/2. Transferred pt to access nurse. Call back # [phone]

## 2022-12-15 NOTE — Telephone Encounter (Signed)
Noted. See that patient has been triaged

## 2022-12-16 ENCOUNTER — Ambulatory Visit: Payer: Medicare Other | Attending: Primary Care

## 2022-12-16 ENCOUNTER — Ambulatory Visit (INDEPENDENT_AMBULATORY_CARE_PROVIDER_SITE_OTHER): Payer: Medicare Other | Admitting: Primary Care

## 2022-12-16 ENCOUNTER — Encounter: Payer: Self-pay | Admitting: Primary Care

## 2022-12-16 VITALS — BP 132/66 | HR 61 | Temp 97.5°F | Ht 72.0 in | Wt 168.0 lb

## 2022-12-16 DIAGNOSIS — R42 Dizziness and giddiness: Secondary | ICD-10-CM

## 2022-12-16 DIAGNOSIS — M2550 Pain in unspecified joint: Secondary | ICD-10-CM

## 2022-12-16 DIAGNOSIS — G8929 Other chronic pain: Secondary | ICD-10-CM | POA: Diagnosis not present

## 2022-12-16 DIAGNOSIS — K769 Liver disease, unspecified: Secondary | ICD-10-CM | POA: Insufficient documentation

## 2022-12-16 LAB — C-REACTIVE PROTEIN: CRP: <1 mg/dL (ref 0.5–20.0)

## 2022-12-16 LAB — SEDIMENTATION RATE: Sed Rate: 4 mm/hr (ref 0–20)

## 2022-12-16 NOTE — Assessment & Plan Note (Signed)
Incidental finding on CT abdomen/pelvis from March 2024.  Reviewed abdominal ultrasound from 2021 which reveals likely hemangiomas. It is reasonable to defer MRI at this point as patient insist that his lesions are benign. Will defer to PCP.

## 2022-12-16 NOTE — Progress Notes (Signed)
Subjective:    Patient ID: Brian Matthews, male    DOB: Jan 11, 1951, 72 y.o.   MRN: [id number]  HPI  Brian Matthews is a very pleasant 72 y.o. male patient of Matt, NP with a history of hypertension, GERD, thyroiditis, paresthesias, hyperlipidemia, lumbar back pain, gastric ulcer who presents today for ED follow-up.  He presented to [facility] ED on 12/15/2022 for chronic intermittent body aches, lightheadedness, irregular heartbeat, throat fullness.  He initially contacted our office regarding the symptoms but was sent to the ED per triage nurse.  During his stay in the ED he underwent EKG which showed normal sinus rhythm with sinus arrhythmia without ischemia or infarction, without changes to prior ECG.  His labs were grossly negative including BMP, CBC with differential, urinalysis.  Today he mentions chronic symptoms of intermittent lightheadedness, aching to the mid left thoracic back, a lump sensation to the throat, bilateral neck pain, bilateral lower extremity muscle aches to his thighs/hamstrings. His lightheadedness and lump in the throat sensation occur sporadically, no particular cause that he's identified.   For years, he has been told that he has "an arrhythmia" and also sees the arrhythmia symbol on his blood pressure monitor on occasion.  Over the last 3-4 weeks he's noticed an "irregular heart rhythm symbol pop up on my blood pressure monitor" more frequently than usual. His BP runs between 120-130/70-80's.   He does experience chronic postnasal drip and nasal congestion. He does not take an antihistamine routinely. Evaluated by ENT a few years ago.   He denies lumbar back pain, bilateral lower and upper extremity numbness, esophageal burning, belching, unexplained weight loss, bloody stools, nausea, vomiting, palpitations, chest pain, diaphoresis, headaches, visual changes, lightheadedness with position changes.   He has a family history of brain cancer in his brother, colon cancer  in his sister who are deceased; rheumatoid arthritis in his father.   His last colonoscopy was in November 2023, recall in 5 years. He exercises 5 days weekly and has no problem. He underwent MRI brain in April 2023 for left sided paresthesias. MRI was negative for acute findings, demyelinating disease, brain tumor/mass.  He underwent CT abdomen/pelvis in March 2024 which revealed incidental finding of small liver lesions that were unable to be identified, MRI abdomen with and without contrast was recommended.  He has yet to complete this, but he has known about the spots on his liver for years.  He discussed this with his PCP who did not believe that further imaging was needed.  BP Readings from Last 3 Encounters:  12/16/22 132/66  12/15/22 126/72  08/23/22 (!) 129/53      Review of Systems  Constitutional:  Negative for diaphoresis and unexpected weight change.  HENT:  Positive for postnasal drip.   Eyes:  Negative for visual disturbance.  Respiratory:  Negative for shortness of breath.   Cardiovascular:  Negative for chest pain and palpitations.  Gastrointestinal:  Negative for constipation and diarrhea.  Musculoskeletal:  Positive for myalgias.  Neurological:  Positive for light-headedness. Negative for numbness.         Past Medical History:  Diagnosis Date   Allergy    Broken wrist    Childhood asthma    Colon polyps    GERD (gastroesophageal reflux disease)    History of blood transfusion    Hx of adenomatous polyp of colon 04/29/2017   Hypertension    Hyperthyroidism    Positive TB test     Social History   Socioeconomic History  Marital status: Divorced    Spouse name: Not on file   Number of children: 0   Years of education: Not on file   Highest education level: Master's degree (e.g., MA, MS, MEng, MEd, MSW, MBA)  Occupational History   Occupation: Public librarian    Comment: Retired  Tobacco Use   Smoking status: Never    Passive exposure: Past  (sister smoked 50 years ago)   Smokeless tobacco: Never  Vaping Use   Vaping Use: Never used  Substance and Sexual Activity   Alcohol use: No    Alcohol/week: 0.0 standard drinks of alcohol   Drug use: No   Sexual activity: Not on file  Other Topics Concern   Not on file  Social History Narrative   Right Handed    Lives in a two story home    Social Determinants of Health   Financial Resource Strain: Low Risk  (12/15/2022)   Overall Financial Resource Strain (CARDIA)    Difficulty of Paying Living Expenses: Not hard at all  Food Insecurity: No Food Insecurity (12/15/2022)   Hunger Vital Sign    Worried About Running Out of Food in the Last Year: Never true    Ran Out of Food in the Last Year: Never true  Transportation Needs: No Transportation Needs (12/15/2022)   PRAPARE - Administrator, Civil Service (Medical): No    Lack of Transportation (Non-Medical): No  Physical Activity: Sufficiently Active (12/15/2022)   Exercise Vital Sign    Days of Exercise per Week: 5 days    Minutes of Exercise per Session: 60 min  Stress: No Stress Concern Present (12/15/2022)   [employer] of Occupational Health - Occupational Stress Questionnaire    Feeling of Stress : Not at all  Social Connections: Socially Isolated (12/15/2022)   Social Connection and Isolation Panel [NHANES]    Frequency of Communication with Friends and Family: Twice a week    Frequency of Social Gatherings with Friends and Family: Once a week    Attends Religious Services: Never    Database administrator or Organizations: No    Attends Engineer, structural: Not on file    Marital Status: Divorced  Intimate Partner Violence: Not on file    Past Surgical History:  Procedure Laterality Date   COLONOSCOPY  04/2017   Dr Leone Payor   SKIN BIOPSY     04/24/22   UPPER GASTROINTESTINAL ENDOSCOPY     [id number]    Family History  Problem Relation Age of Onset   Hypertension Mother    Arthritis Father         Rheumatoid Artritis   Heart Problems Father    Colon cancer Sister    Brain cancer Brother    Melanoma Brother    Thyroid disease Neg Hx    Esophageal cancer Neg Hx    Pancreatic cancer Neg Hx    Rectal cancer Neg Hx    Stomach cancer Neg Hx    Crohn's disease Neg Hx    Colon polyps Neg Hx    Ulcerative colitis Neg Hx     Allergies  Allergen Reactions   Lisinopril Cough   Penicillins Other (See Comments)    Pt does not remember reaction    Current Outpatient Medications on File Prior to Visit  Medication Sig Dispense Refill   Alpha-Lipoic Acid 200 MG CAPS Take by mouth daily.     amLODipine (NORVASC) 5 MG tablet TAKE 1 TABLET(5 MG) BY  MOUTH DAILY 90 tablet 1   Cholecalciferol (VITAMIN D-3) 125 MCG (5000 UT) TABS Take by mouth. 1 tablet 4-5 days per week     Collagen-Vitamin C (ULTRA COLLAGEN + C PO) Take by mouth daily.     Cranberry 500 MG TABS Take by mouth daily.     Magnesium 500 MG CAPS Take by mouth daily.     Misc Natural Products (GLUCOSAMINE CHOND COMPLEX/MSM) TABS Take by mouth daily.     Misc Natural Products (MENS PROSTATE HEALTH FORMULA PO) Take 1 tablet by mouth daily.     Multiple Vitamin (MULTIVITAMIN) tablet Take 1 tablet by mouth daily.     Omega-3 Fatty Acids (FISH OIL) 1200 MG CAPS Take by mouth. 1-2 per day     Plant Sterols and Stanols (CHOLESTOFF PO) Take 900 mg by mouth daily.     Saw Palmetto 450 MG CAPS Take 2 capsules by mouth daily.     triamterene-hydrochlorothiazide (MAXZIDE-25) 37.5-25 MG tablet TAKE 1 TABLET BY MOUTH DAILY 90 tablet 3   Turmeric (QC TUMERIC COMPLEX) 500 MG CAPS Take by mouth daily.     vitamin B-12 (CYANOCOBALAMIN) 500 MCG tablet Take 500 mcg by mouth daily. 1 tablet 4-5 days per week     No current facility-administered medications on file prior to visit.    BP 132/66   Pulse 61   Temp (!) 97.5 F (36.4 C) (Temporal)   Ht 6' (1.829 m)   Wt 168 lb (76.2 kg)   SpO2 98%   BMI 22.78 kg/m  Objective:   Physical  Exam HENT:     Right Ear: Tympanic membrane and ear canal normal.     Left Ear: Tympanic membrane and ear canal normal.  Cardiovascular:     Rate and Rhythm: Normal rate and regular rhythm.  Pulmonary:     Effort: Pulmonary effort is normal.     Breath sounds: Normal breath sounds. No wheezing or rales.  Musculoskeletal:     Cervical back: Neck supple.  Skin:    General: Skin is warm and dry.  Neurological:     Mental Status: He is alert and oriented to person, place, and time.           Assessment & Plan:  Chronic joint pain Assessment & Plan: Reviewed lumbar plain films from 2022, which revealed diffuse degenerative disc disease to the lower spine.  This could certainly be causing some of his lower extremity pain.  Discussed this today.  I do not see where he has been tested for rheumatoid arthritis, will order labs today to rule out. Add uric acid level.  Orders: -     C-reactive protein -     Cyclic citrul peptide antibody, IgG -     Rheumatoid factor -     Sedimentation rate  Lightheadedness Assessment & Plan: Orthostatic vital signs today negative.  Reviewed EKG from yesterday. Reviewed hospital notes, labs, imaging.  Reviewed MRI brain from April 2023, fortunately no evidence of tumor or brain mass.  Orders placed for ZIO Holter monitor to be worn for 2 weeks to evaluate for arrhythmia that could be causing symptoms. Reviewed thyroid labs from February 2024 and prior thyroid labs, all of which are without evidence of hyper or hypothyroidism.  Offered ENT evaluation for lump in throat sensation and lightheadedness, he, he declines. He does have evidence of aortic atherosclerosis from prior imaging, consider cardiology referral.  Will also forward to PCP for input.  Orders: -  LONG TERM MONITOR (3-14 DAYS); Future  Liver lesion Assessment & Plan: Incidental finding on CT abdomen/pelvis from March 2024.  Reviewed abdominal ultrasound from 2021 which  reveals likely hemangiomas. It is reasonable to defer MRI at this point as patient insist that his lesions are benign. Will defer to PCP.         Doreene Nest, NP

## 2022-12-16 NOTE — Patient Instructions (Signed)
Complete the Holter monitor once it comes in the mail.  Stop by the lab prior to leaving today. I will notify you of your results once received.   We can consider a referral to ENT for your light headedness and throat sensation.  Please let me know if you change your mind.  It was a pleasure meeting you!

## 2022-12-16 NOTE — Assessment & Plan Note (Addendum)
Orthostatic vital signs today negative.  Reviewed EKG from yesterday. Reviewed hospital notes, labs, imaging.  Reviewed MRI brain from April 2023, fortunately no evidence of tumor or brain mass.  Orders placed for ZIO Holter monitor to be worn for 2 weeks to evaluate for arrhythmia that could be causing symptoms. Reviewed thyroid labs from February 2024 and prior thyroid labs, all of which are without evidence of hyper or hypothyroidism.  Offered ENT evaluation for lump in throat sensation and lightheadedness, he, he declines. He does have evidence of aortic atherosclerosis from prior imaging, consider cardiology referral.  Will also forward to PCP for input.

## 2022-12-16 NOTE — Assessment & Plan Note (Signed)
Reviewed lumbar plain films from 2022, which revealed diffuse degenerative disc disease to the lower spine.  This could certainly be causing some of his lower extremity pain.  Discussed this today.  I do not see where he has been tested for rheumatoid arthritis, will order labs today to rule out. Add uric acid level.

## 2022-12-20 LAB — RHEUMATOID FACTOR: Rheumatoid fact SerPl-aCnc: <10 IU/mL (ref <14)

## 2022-12-20 LAB — CYCLIC CITRUL PEPTIDE ANTIBODY, IGG: Cyclic Citrullin Peptide Ab: <16 UNITS

## 2022-12-21 DIAGNOSIS — R42 Dizziness and giddiness: Secondary | ICD-10-CM

## 2022-12-24 ENCOUNTER — Encounter: Payer: Self-pay | Admitting: Nurse Practitioner

## 2022-12-25 ENCOUNTER — Emergency Department (HOSPITAL_COMMUNITY): Payer: Medicare Other

## 2022-12-25 ENCOUNTER — Other Ambulatory Visit: Payer: Self-pay

## 2022-12-25 ENCOUNTER — Emergency Department (HOSPITAL_COMMUNITY)
Admission: EM | Admit: 2022-12-25 | Discharge: 2022-12-25 | Disposition: A | Discharge status: Home / Self Care | Payer: Medicare Other | Attending: Emergency Medicine | Admitting: Emergency Medicine

## 2022-12-25 DIAGNOSIS — U071 COVID-19: Secondary | ICD-10-CM | POA: Diagnosis not present

## 2022-12-25 DIAGNOSIS — R059 Cough, unspecified: Secondary | ICD-10-CM | POA: Diagnosis present

## 2022-12-25 DIAGNOSIS — I10 Essential (primary) hypertension: Secondary | ICD-10-CM | POA: Diagnosis not present

## 2022-12-25 DIAGNOSIS — Z79899 Other long term (current) drug therapy: Secondary | ICD-10-CM | POA: Insufficient documentation

## 2022-12-25 LAB — SARS CORONAVIRUS 2 BY RT PCR: SARS Coronavirus 2 by RT PCR: POSITIVE — AB

## 2022-12-25 MED ORDER — PAXLOVID (300/100) 20 X 150 MG & 10 X 100MG PO TBPK: 3.0000 | ORAL_TABLET | Freq: Two times a day (BID) | ORAL | 0 refills | Status: AC

## 2022-12-25 MED ORDER — PAXLOVID (300/100) 20 X 150 MG & 10 X 100MG PO TBPK: 3.0000 | ORAL_TABLET | Freq: Two times a day (BID) | ORAL | 0 refills | Status: DC

## 2022-12-25 NOTE — ED Triage Notes (Signed)
Patient requesting Covid test , reports dry cough with chest congestion , runny nose / sneezing and body aches for several days .

## 2022-12-25 NOTE — ED Provider Notes (Signed)
[place] EMERGENCY DEPARTMENT AT [facility] Provider Note   CSN: [id number] Arrival date & time: 12/25/22  2159     History  Chief Complaint  Patient presents with   Cough/Chest Congestion     Brian Matthews is a 72 y.o. male.  Patient presents to the emergency department requesting a COVID test.  He reports dry cough with chest congestion, runny nose, sneezing, body aches for the past 2 to 3 days.  He denies chest pain, shortness of breath, nausea, vomiting.  Past medical history significant for hypertension, GERD  HPI     Home Medications Prior to Admission medications   Medication Sig Start Date End Date Taking? Authorizing Provider  Alpha-Lipoic Acid 200 MG CAPS Take by mouth daily.    [provider]  amLODipine (NORVASC) 5 MG tablet TAKE 1 TABLET(5 MG) BY MOUTH DAILY 11/17/22   Eden Emms, NP  Cholecalciferol (VITAMIN D-3) 125 MCG (5000 UT) TABS Take by mouth. 1 tablet 4-5 days per week    [provider]  Collagen-Vitamin C (ULTRA COLLAGEN + C PO) Take by mouth daily.    [provider]  Cranberry 500 MG TABS Take by mouth daily.    [provider]  Magnesium 500 MG CAPS Take by mouth daily.    [provider]  Misc Natural Products (GLUCOSAMINE CHOND COMPLEX/MSM) TABS Take by mouth daily.    [provider]  Misc Natural Products (MENS PROSTATE HEALTH FORMULA PO) Take 1 tablet by mouth daily.    [provider]  Multiple Vitamin (MULTIVITAMIN) tablet Take 1 tablet by mouth daily.    [provider]  nirmatrelvir & ritonavir (PAXLOVID, 300/100,) 20 x 150 MG & 10 x 100MG  TBPK Take 3 tablets by mouth 2 (two) times daily for 5 days. 12/25/22 12/30/22  Darrick Grinder, PA-C  Omega-3 Fatty Acids (FISH OIL) 1200 MG CAPS Take by mouth. 1-2 per day    [provider]  Plant Sterols and Stanols (CHOLESTOFF PO) Take 900 mg by mouth daily.    [provider]  Saw Palmetto 450 MG  CAPS Take 2 capsules by mouth daily.    [provider]  triamterene-hydrochlorothiazide (MAXZIDE-25) 37.5-25 MG tablet TAKE 1 TABLET BY MOUTH DAILY 02/18/22   Eden Emms, NP  Turmeric (QC TUMERIC COMPLEX) 500 MG CAPS Take by mouth daily.    [provider]  vitamin B-12 (CYANOCOBALAMIN) 500 MCG tablet Take 500 mcg by mouth daily. 1 tablet 4-5 days per week    [provider]      Allergies    Lisinopril and Penicillins    Review of Systems   Review of Systems  Physical Exam Updated Vital Signs BP (!) 149/85   Pulse 87   Temp 97.7 F (36.5 C)   Resp 20   SpO2 97%  Physical Exam Vitals and nursing note reviewed.  HENT:     Head: Normocephalic and atraumatic.  Eyes:     Conjunctiva/sclera: Conjunctivae normal.  Cardiovascular:     Rate and Rhythm: Normal rate.  Pulmonary:     Effort: Pulmonary effort is normal. No respiratory distress.  Musculoskeletal:        General: No signs of injury.     Cervical back: Normal range of motion.  Skin:    General: Skin is dry.  Neurological:     Mental Status: He is alert.  Psychiatric:        Speech: Speech normal.  Behavior: Behavior normal.     ED Results / Procedures / Treatments   Labs (all labs ordered are listed, but only abnormal results are displayed) Labs Reviewed  SARS CORONAVIRUS 2 BY RT PCR - Abnormal; Notable for the following components:      Result Value   SARS Coronavirus 2 by RT PCR POSITIVE (*)    All other components within normal limits    EKG None  Radiology DG Chest 2 View  Result Date: 12/25/2022 CLINICAL DATA:  Cough, chest congestion EXAM: CHEST - 2 VIEW COMPARISON:  10/02/2021 FINDINGS: The heart size and mediastinal contours are within normal limits. Both lungs are clear. The visualized skeletal structures are unremarkable. IMPRESSION: No active cardiopulmonary disease. Electronically Signed   By: Charlett Nose M.D.   On: 12/25/2022 22:24     Procedures Procedures    Medications Ordered in ED Medications - No data to display  ED Course/ Medical Decision Making/ A&P                             Medical Decision Making Amount and/or Complexity of Data Reviewed Radiology: ordered.  Risk Prescription drug management.   Patient presents to the emergency department with upper respiratory symptoms.  Differential diagnosis includes but is not limited to COVID-19, influenza, other viral illness, others  Patient has comorbidities including hypertension  I ordered and interpreted imaging including a chest x-ray.  There were no acute findings.  I agree with the radiologist findings.  I ordered and reviewed laboratory results.  The patient had a positive COVID-19 test result.  Patient with COVID-19 infection, consistent with presentation/history.  I discussed antiviral medication with the patient and he elected to proceed with Paxlovid.  I was able to find metabolic panel from July 1 which showed a GFR greater than 60.  Paxlovid with normal renal function dosing prescribed.  Patient instructed to continue supportive care at home including over-the-counter pain medication/cold flu medication for symptom relief.  Return precautions provided including shortness of breath.        Final Clinical Impression(s) / ED Diagnoses Final diagnoses:  COVID-19    Rx / DC Orders ED Discharge Orders          Ordered    nirmatrelvir & ritonavir (PAXLOVID, 300/100,) 20 x 150 MG & 10 x 100MG  TBPK  2 times daily,   Status:  Discontinued        12/25/22 2251    nirmatrelvir & ritonavir (PAXLOVID, 300/100,) 20 x 150 MG & 10 x 100MG  TBPK  2 times daily        12/25/22 2252              Pamala Duffel 12/25/22 2256    Lonell Grandchild, MD 01/01/23 [phone]

## 2022-12-25 NOTE — Discharge Instructions (Addendum)
You were diagnosed with Covid-19. Please take the prescribed antiviral medication. You may take over the counter medication such as cold/flu, acetaminophen, ibuprofen for symptom control. If you develop any life threatening symptoms such as shortness of breath please return to the emergency department.

## 2022-12-26 ENCOUNTER — Telehealth: Payer: Self-pay

## 2022-12-26 NOTE — Transitions of Care (Post Inpatient/ED Visit) (Signed)
   12/26/2022  Name: Brian Matthews MRN: [id number] DOB: 09/28/1950  Today's TOC FU Call Status: Today's TOC FU Call Status:: Unsuccessul Call (1st Attempt) Unsuccessful Call (1st Attempt) Date: 12/26/22  Attempted to reach the patient regarding the most recent Inpatient/ED visit.  Follow Up Plan: Additional outreach attempts will be made to reach the patient to complete the Transitions of Care (Post Inpatient/ED visit) call.   Signature Elisha Ponder LPN [facility] AWV Team Direct dial:  [phone]

## 2022-12-29 ENCOUNTER — Encounter: Payer: Self-pay | Admitting: Nurse Practitioner

## 2022-12-29 ENCOUNTER — Ambulatory Visit: Payer: Medicare Other | Admitting: Nurse Practitioner

## 2022-12-29 VITALS — BP 128/78 | HR 73 | Temp 98.0°F | Ht 72.0 in | Wt 166.0 lb

## 2022-12-29 DIAGNOSIS — Z09 Encounter for follow-up examination after completed treatment for conditions other than malignant neoplasm: Secondary | ICD-10-CM

## 2022-12-29 DIAGNOSIS — I499 Cardiac arrhythmia, unspecified: Secondary | ICD-10-CM | POA: Diagnosis not present

## 2022-12-29 DIAGNOSIS — M791 Myalgia, unspecified site: Secondary | ICD-10-CM

## 2022-12-29 NOTE — Patient Instructions (Signed)
Nice to see you today You are ok to take a break from the exercising while you are wearing the heart monitor I need to see you 05/24/2023 or after for your physical, sooner if you need me

## 2022-12-29 NOTE — Assessment & Plan Note (Signed)
Since patient has taken a break from exercising for the past week myalgias have improved.

## 2022-12-29 NOTE — Assessment & Plan Note (Addendum)
Was seen by colleague for the same.  Noticed to have sinus arrhythmia when he went to the emergency department this is not new for patient states he was noted to have that when he was entering the services approximately 45 years ago.  Patient is currently wearing stick on heart monitor pending results once time trial has completed

## 2022-12-29 NOTE — Assessment & Plan Note (Signed)
Did review patient's most recent emergency department visit inclusive of lab, provider note, and imaging

## 2022-12-29 NOTE — Progress Notes (Signed)
   Acute Office Visit  Subjective:     Patient ID: Brian Matthews, male    DOB: Nov 06, 1950, 72 y.o.   MRN: [id number]  Chief Complaint  Patient presents with   Cough    Pt complains of COVID symptoms and Leg aches, stomach issues, coughing, No fever. ER visit on 12/25/22     Patient is in today for cough/hospital follow-up  Patient was seen on 12/25/2022 an dx with covid 19 and prescribed the paxlovid medications. They did obtain a chext xray that showed no acute abnormalities. States last week he started having sick symptoms and remembered that his sister had covid.  Patient states his symptoms are almost gone he feels "almost back to normal".  States that he was seen by Mayra Reel for leg and arm pain along with palpations. He is wearing the zio monitor. States that since the covid symptoms the myalgias have improved but he also noticed that he has stopped exercising for almost a week since being diagnosed with COVID.  Review of Systems  Constitutional:  Negative for chills and fever.  Respiratory:  Positive for cough. Negative for shortness of breath.   Cardiovascular:  Negative for chest pain.  Musculoskeletal:  Negative for myalgias.  Neurological:  Negative for headaches.        Objective:    BP 128/78   Pulse 73   Temp 98 F (36.7 C) (Temporal)   Ht 6' (1.829 m)   Wt 166 lb (75.3 kg)   SpO2 95%   BMI 22.51 kg/m    Physical Exam Vitals and nursing note reviewed.  Constitutional:      Appearance: Normal appearance.  Cardiovascular:     Rate and Rhythm: Normal rate and regular rhythm.     Pulses:          Posterior tibial pulses are 1+ on the right side and 1+ on the left side.     Heart sounds: Normal heart sounds.  Pulmonary:     Effort: Pulmonary effort is normal.     Breath sounds: Normal breath sounds.  Neurological:     Mental Status: He is alert.     No results found for any visits on 12/29/22.      Assessment & Plan:   Problem List Items  Addressed This Visit       Other   Irregular heart beat    Was seen by colleague for the same.  Noticed to have sinus arrhythmia when he went to the emergency department this is not new for patient states he was noted to have that when he was entering the services approximately 45 years ago.  Patient is currently wearing stick on heart monitor pending results once time trial has completed      Hospital discharge follow-up - Primary    Did review patient's most recent emergency department visit inclusive of lab, provider note, and imaging      Myalgia    Since patient has taken a break from exercising for the past week myalgias have improved.       No orders of the defined types were placed in this encounter.   Return in about 5 months (around 05/31/2023) for CPE and Labs.  Audria Nine, NP

## 2023-01-21 ENCOUNTER — Institutional Professional Consult (permissible substitution): Payer: Medicare Other | Admitting: Cardiology

## 2023-01-27 ENCOUNTER — Encounter: Payer: Self-pay | Admitting: Cardiology

## 2023-01-27 ENCOUNTER — Ambulatory Visit: Payer: Medicare Other | Attending: Cardiology | Admitting: Cardiology

## 2023-01-27 VITALS — BP 138/72 | HR 69 | Ht 72.0 in | Wt 168.0 lb

## 2023-01-27 DIAGNOSIS — I4729 Other ventricular tachycardia: Secondary | ICD-10-CM

## 2023-01-27 DIAGNOSIS — I10 Essential (primary) hypertension: Secondary | ICD-10-CM

## 2023-01-27 NOTE — Patient Instructions (Signed)
Medication Instructions:  Your physician recommends that you continue on your current medications as directed. Please refer to the Current Medication list given to you today.  *If you need a refill on your cardiac medications before your next appointment, please call your pharmacy*  Testing/Procedures: Your physician has requested that you have an echocardiogram. Echocardiography is a painless test that uses sound waves to create images of your heart. It provides your doctor with information about the size and shape of your heart and how well your heart's chambers and valves are working. This procedure takes approximately one hour. There are no restrictions for this procedure. Please do NOT wear cologne, perfume, aftershave, or lotions (deodorant is allowed). Please arrive 15 minutes prior to your appointment time.  Follow-Up: At [facility], you and your health needs are our priority.  As part of our continuing mission to provide you with exceptional heart care, we have created designated Provider Care Teams.  These Care Teams include your primary Cardiologist (physician) and Advanced Practice Providers (APPs -  Physician Assistants and Nurse Practitioners) who all work together to provide you with the care you need, when you need it.  Your next appointment:   Based on results of your echocardiogram

## 2023-01-27 NOTE — Progress Notes (Signed)
Electrophysiology Office Note:    Date:  01/27/2023   ID:  Brian Matthews, DOB Apr 07, 1951, MRN [id number]  [facility] Cardiologist:  None  [facility] Electrophysiologist:  Lanier Prude, MD   Referring MD: Eden Emms, NP   Chief Complaint: Abnormal heart monitor  History of Present Illness:    Brian Matthews is a 72 y.o. malewho I am seeing today for an evaluation of an abnormal heart monitor at the request of Dr. Toney Reil.  The patient was last seen by Dr. Toney Reil on December 29, 2022.  The patient has a medical history that includes hypertension, hypothyroidism, GERD, hyperlipidemia.  He was seen in the emergency department December 15, 2022 for body aches.  He called his outpatient clinic triage line and was referred to the ER because he also reported lightheadedness and an irregular heartbeat.  In the ER he apparently felt well.  His EKG in the emergency department showed sinus rhythm/sinus arrhythmia.  A heart monitor was ordered during the follow-up process that showed nonsustained ventricular tachycardia.  Given one of the NSVT episodes lasted 18 beats, he was referred to cardiology.  Today he is doing well.  He is extremely active.  He still jogs 4 to 6 miles occasionally.  No syncope or presyncope.  He does not feel palpitations.  The heart monitor was originally ordered when his home blood pressure cuff reported an irregular heartbeat.        Their past medical, social and family history was reveiwed.   ROS:   Please see the history of present illness.    All other systems reviewed and are negative.  EKGs/Labs/Other Studies Reviewed:    The following studies were reviewed today:  January 12, 2023 ZIO monitor personally reviewed  HR 39 - 197 bpm, average 67 bpm. 1 nonsustained VT lasting 18 beats with an average rate of 144 bpm. 23 nonsustained SVT, longest lasting 13.3 seconds with an average rate of 115 bpm. Rare supraventricular and ventricular ectopy. No  sustained arrhythmias. No atrial fibrillation.  December 15, 2022 EKG reviewed and shows sinus rhythm, ventricular rate 61 bpm.         Physical Exam:    VS:  BP 138/72 (BP Location: Left Arm, Patient Position: Sitting, Cuff Size: Normal)   Pulse 69   Ht 6' (1.829 m)   Wt 168 lb (76.2 kg)   SpO2 97%   BMI 22.78 kg/m     Wt Readings from Last 3 Encounters:  01/27/23 168 lb (76.2 kg)  12/29/22 166 lb (75.3 kg)  12/16/22 168 lb (76.2 kg)     GEN:  Well nourished, well developed in no acute distress CARDIAC: RRR, no murmurs, rubs, gallops RESPIRATORY:  Clear to auscultation without rales, wheezing or rhonchi       ASSESSMENT AND PLAN:    1. NSVT (nonsustained ventricular tachycardia) (HCC)   2. Primary hypertension     #NSVT No syncope history.  No signs or symptoms of heart failure. We discussed the pathophysiology of NSVT/VT during today's clinic appointment. I would like to get an echocardiogram to rule out structural heart disease.  I think it is okay for him to continue his exercise routine.  #Hypertension At goal today.  Recommend checking blood pressures 1-2 times per week at home and recording the values.  Recommend bringing these recordings to the primary care physician. Continue triamterene hydrochlorothiazide.   Follow-up based on results of monitor     Signed, Rossie Muskrat. Lalla Brothers, MD, [facility],  [facility] 01/27/2023 11:50 AM    Electrophysiology [facility]

## 2023-01-30 ENCOUNTER — Encounter: Payer: Self-pay | Admitting: Nurse Practitioner

## 2023-02-10 ENCOUNTER — Other Ambulatory Visit: Payer: Self-pay | Admitting: Cardiology

## 2023-02-10 DIAGNOSIS — I10 Essential (primary) hypertension: Secondary | ICD-10-CM

## 2023-02-10 DIAGNOSIS — I4729 Other ventricular tachycardia: Secondary | ICD-10-CM

## 2023-02-13 ENCOUNTER — Other Ambulatory Visit: Payer: Self-pay | Admitting: Nurse Practitioner

## 2023-02-13 DIAGNOSIS — I10 Essential (primary) hypertension: Secondary | ICD-10-CM

## 2023-02-13 NOTE — Telephone Encounter (Signed)
LAST APPOINTMENT DATE: 12/29/2022   NEXT APPOINTMENT DATE: Visit date not found   Triameterene MAXZIDE   LAST REFILL:11/15/22  QTY: #90 3RF

## 2023-02-23 ENCOUNTER — Other Ambulatory Visit: Payer: Medicare Other

## 2023-02-27 ENCOUNTER — Encounter: Payer: Self-pay | Admitting: Nurse Practitioner

## 2023-02-27 NOTE — Telephone Encounter (Signed)
Updated pt chart with current COVID vaccine dates.

## 2023-03-09 ENCOUNTER — Ambulatory Visit: Payer: Medicare Other | Attending: Cardiology

## 2023-03-09 DIAGNOSIS — I4729 Other ventricular tachycardia: Secondary | ICD-10-CM | POA: Diagnosis not present

## 2023-03-09 DIAGNOSIS — I088 Other rheumatic multiple valve diseases: Secondary | ICD-10-CM | POA: Diagnosis not present

## 2023-03-10 LAB — ECHOCARDIOGRAM COMPLETE
Area-P 1/2: 2.99 cm2
S' Lateral: 2.4 cm

## 2023-03-15 ENCOUNTER — Encounter: Payer: Self-pay | Admitting: Nurse Practitioner

## 2023-03-15 DIAGNOSIS — Z711 Person with feared health complaint in whom no diagnosis is made: Secondary | ICD-10-CM

## 2023-03-26 ENCOUNTER — Telehealth: Payer: Self-pay | Admitting: Nurse Practitioner

## 2023-03-26 NOTE — Telephone Encounter (Signed)
   Reason for Referral Request: Trouble Reading and speaking clearly (Related to [facility] message)  Has patient been seen PCP for this complaint?  No,  please schedule patient for appointment for complaint.  Patient scheduled on:   Yes, please find out following information.  Referral for which specialty: Neurology   Preferred office/provider: [facility] [place], [place]

## 2023-03-30 NOTE — Telephone Encounter (Signed)
Per my chart referral has been placed.  No further action needed at this time.

## 2023-04-03 ENCOUNTER — Encounter: Payer: Self-pay | Admitting: *Deleted

## 2023-04-03 ENCOUNTER — Ambulatory Visit (INDEPENDENT_AMBULATORY_CARE_PROVIDER_SITE_OTHER): Payer: Medicare Other | Admitting: Nurse Practitioner

## 2023-04-03 ENCOUNTER — Encounter: Payer: Self-pay | Admitting: Nurse Practitioner

## 2023-04-03 VITALS — BP 124/82 | HR 67 | Temp 97.7°F | Ht 72.0 in | Wt 169.4 lb

## 2023-04-03 DIAGNOSIS — R4189 Other symptoms and signs involving cognitive functions and awareness: Secondary | ICD-10-CM | POA: Insufficient documentation

## 2023-04-03 DIAGNOSIS — Z23 Encounter for immunization: Secondary | ICD-10-CM | POA: Diagnosis not present

## 2023-04-03 DIAGNOSIS — R4689 Other symptoms and signs involving appearance and behavior: Secondary | ICD-10-CM | POA: Insufficient documentation

## 2023-04-03 DIAGNOSIS — I10 Essential (primary) hypertension: Secondary | ICD-10-CM | POA: Diagnosis not present

## 2023-04-03 LAB — CBC
HCT: 48.7 % (ref 39.0–52.0)
Hemoglobin: 16.2 g/dL (ref 13.0–17.0)
MCHC: 33.2 g/dL (ref 30.0–36.0)
MCV: 91.6 fL (ref 78.0–100.0)
Platelets: 150 10*3/uL (ref 150.0–400.0)
RBC: 5.31 Mil/uL (ref 4.22–5.81)
RDW: 13.5 % (ref 11.5–15.5)
WBC: 4.1 10*3/uL (ref 4.0–10.5)

## 2023-04-03 LAB — COMPREHENSIVE METABOLIC PANEL
ALT: 14 U/L (ref 0–53)
AST: 18 U/L (ref 0–37)
Albumin: 4.2 g/dL (ref 3.5–5.2)
Alkaline Phosphatase: 70 U/L (ref 39–117)
BUN: 10 mg/dL (ref 6–23)
CO2: 31 meq/L (ref 19–32)
Calcium: 9.2 mg/dL (ref 8.4–10.5)
Chloride: 102 meq/L (ref 96–112)
Creatinine, Ser: 1.03 mg/dL (ref 0.40–1.50)
GFR: 73.16 mL/min (ref >60.00)
Glucose, Bld: 94 mg/dL (ref 70–99)
Potassium: 4.4 meq/L (ref 3.5–5.1)
Sodium: 140 meq/L (ref 135–145)
Total Bilirubin: 0.8 mg/dL (ref 0.2–1.2)
Total Protein: 6.6 g/dL (ref 6.0–8.3)

## 2023-04-03 LAB — VITAMIN B12: Vitamin B-12: 542 pg/mL (ref 211–911)

## 2023-04-03 LAB — TSH: TSH: 2.54 u[IU]/mL (ref 0.35–5.50)

## 2023-04-03 NOTE — Progress Notes (Signed)
Acute Office Visit  Subjective:     Patient ID: Brian Matthews, male    DOB: [birth year], 72 y.o.   MRN: [id number]  Chief Complaint  Patient presents with   Referral    HPI Patient is in today for cognitive concerns with a history of HTn, GERD, HLD  Patient has messaged me on mychart several times about having episodes . He mentions this happening 3 times over the course of approx 9 months. States that each episode only lasts for minutes at a time. He describes trouble clearly reading and speakig works.  Of note patient has had a MR brain on 10/02/2021 that was grossly negative. It did show small flair hyperintensitites likely early small vessel changes. He also had a CT head on 12/25/2020 that was also negative   States no accompanying symptoms States that he dies have nimbness in bilateral feet but that has that way for 15-20 years   States that he is still having a good diet and still working out. States that he does have intermittent frontal headaches that are increasing in frewuncy. State it can be behind the eyes, temples and sometimes the back of the head. States that in the mornings he feels it slightly more and it will go away on its own.  When he was married he never was told that he snored. States that he "pretty much" feels rested when he wake up. Over the past 10 years he will sleep 6 hours or so. No urge to naop during the day. States infreuqently he will want to nap but will lay down an dnot be sleepy any more      04/03/2023   11:19 AM  MMSE - Mini Mental State Exam  Orientation to time 5  Orientation to Place 5  Registration 3  Attention/ Calculation 5  Recall 3  Language- name 2 objects 2  Language- repeat 1  Language- follow 3 step command 3  Language- read & follow direction 1  Write a sentence 1  Copy design 1  Total score 30     Review of Systems  Constitutional:  Negative for chills and fever.  Respiratory:  Negative for shortness of breath.    Cardiovascular:  Negative for chest pain.  Neurological:  Positive for headaches. Negative for dizziness.  Psychiatric/Behavioral:  Negative for hallucinations and suicidal ideas.         Objective:    BP 124/82   Pulse 67   Temp 97.7 F (36.5 C) (Oral)   Ht 6' (1.829 m)   Wt 169 lb 6.4 oz (76.8 kg)   SpO2 96%   BMI 22.97 kg/m  BP Readings from Last 3 Encounters:  04/03/23 124/82  01/27/23 138/72  12/29/22 128/78   Wt Readings from Last 3 Encounters:  04/03/23 169 lb 6.4 oz (76.8 kg)  01/27/23 168 lb (76.2 kg)  12/29/22 166 lb (75.3 kg)   SpO2 Readings from Last 3 Encounters:  04/03/23 96%  01/27/23 97%  12/29/22 95%      Physical Exam Vitals and nursing note reviewed.  Constitutional:      Appearance: Normal appearance.  HENT:     Mouth/Throat:     Mouth: Mucous membranes are moist.     Pharynx: Oropharynx is clear.  Eyes:     Extraocular Movements: Extraocular movements intact.     Pupils: Pupils are equal, round, and reactive to light.  Cardiovascular:     Rate and Rhythm: Normal rate and regular rhythm.  Heart sounds: Normal heart sounds.  Pulmonary:     Effort: Pulmonary effort is normal.     Breath sounds: Normal breath sounds.  Neurological:     General: No focal deficit present.     Mental Status: He is alert.     Deep Tendon Reflexes:     Reflex Scores:      Bicep reflexes are 2+ on the right side and 2+ on the left side.      Patellar reflexes are 2+ on the right side and 2+ on the left side.    Comments: Bilateral upper and lower extremity strength 5/5     No results found for any visits on 04/03/23.      Assessment & Plan:   Problem List Items Addressed This Visit       Cardiovascular and Mediastinum   Benign essential HTN   Relevant Orders   CBC   Comprehensive metabolic panel   TSH     Other   Cognitive and behavioral changes - Primary    Had several episodes of cognition changes then we will go back to baseline.   Concern for possible TIAs given patient's age and comorbidity of blood pressure albeit well-controlled.  Check basic labs today.  Referral to neurology.  MRI brain ordered also      Relevant Orders   Ambulatory referral to Neurology   CBC   Comprehensive metabolic panel   TSH   Vitamin B12   MR Brain Wo Contrast   Other Visit Diagnoses     Need for influenza vaccination       Relevant Orders   Flu Vaccine Trivalent High Dose (Fluad) (Completed)       No orders of the defined types were placed in this encounter.   Return in about 6 months (around 10/02/2023) for CPE and Labs.  Audria Nine, NP

## 2023-04-03 NOTE — Patient Instructions (Signed)
Nice to see you today I will be in touch with the labs and imaging once I have it Follow up with me in 6 months

## 2023-04-03 NOTE — Assessment & Plan Note (Signed)
Had several episodes of cognition changes then we will go back to baseline.  Concern for possible TIAs given patient's age and comorbidity of blood pressure albeit well-controlled.  Check basic labs today.  Referral to neurology.  MRI brain ordered also

## 2023-04-07 ENCOUNTER — Encounter: Payer: Self-pay | Admitting: Nurse Practitioner

## 2023-04-15 ENCOUNTER — Telehealth: Payer: Self-pay

## 2023-04-15 NOTE — Telephone Encounter (Signed)
Sent pt mychart message in regards to Neuro Referral.

## 2023-04-18 ENCOUNTER — Ambulatory Visit
Admission: RE | Admit: 2023-04-18 | Discharge: 2023-04-18 | Disposition: A | Discharge status: Home / Self Care | Payer: Medicare Other | Source: Ambulatory Visit | Attending: Nurse Practitioner | Admitting: Nurse Practitioner

## 2023-04-18 DIAGNOSIS — R4189 Other symptoms and signs involving cognitive functions and awareness: Secondary | ICD-10-CM

## 2023-04-21 ENCOUNTER — Telehealth: Payer: Self-pay

## 2023-04-21 NOTE — Telephone Encounter (Signed)
French Ana from [facility] called to make sure Brian Nine, NP sees the MRI results. 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 Consider an MRI of the face (with and without contrast) for further evaluation.

## 2023-04-21 NOTE — Telephone Encounter (Signed)
Noted  

## 2023-04-22 ENCOUNTER — Ambulatory Visit (HOSPITAL_COMMUNITY)
Admission: RE | Admit: 2023-04-22 | Discharge: 2023-04-22 | Disposition: A | Discharge status: Home / Self Care | Payer: Medicare Other | Source: Ambulatory Visit | Attending: Nurse Practitioner | Admitting: Nurse Practitioner

## 2023-04-22 ENCOUNTER — Other Ambulatory Visit: Payer: Self-pay | Admitting: Nurse Practitioner

## 2023-04-22 ENCOUNTER — Telehealth: Payer: Self-pay | Admitting: Nurse Practitioner

## 2023-04-22 ENCOUNTER — Inpatient Hospital Stay
Admission: RE | Admit: 2023-04-22 | Discharge: 2023-04-22 | Disposition: A | Discharge status: Home / Self Care | Payer: Self-pay | Source: Ambulatory Visit | Attending: Nurse Practitioner | Admitting: Nurse Practitioner

## 2023-04-22 DIAGNOSIS — D492 Neoplasm of unspecified behavior of bone, soft tissue, and skin: Secondary | ICD-10-CM

## 2023-04-22 MED ORDER — GADOBUTROL 1 MMOL/ML IV SOLN
7.0000 mL | Freq: Once | INTRAVENOUS | Status: AC | PRN
Start: 2023-04-22 — End: 2023-04-22
  Administered 2023-04-22: 7 mL via INTRAVENOUS

## 2023-04-22 NOTE — Telephone Encounter (Signed)
See result noted. I called patient back and spoke to him

## 2023-04-22 NOTE — Telephone Encounter (Signed)
Patient called in returning a call he received from [facility] this morning. Thank you!

## 2023-04-22 NOTE — Addendum Note (Signed)
Addended by: Eden Emms on: 04/22/2023 02:09 PM   Modules accepted: Orders

## 2023-04-23 ENCOUNTER — Encounter: Payer: Self-pay | Admitting: Nurse Practitioner

## 2023-04-23 ENCOUNTER — Other Ambulatory Visit: Payer: Medicare Other

## 2023-05-07 ENCOUNTER — Ambulatory Visit (INDEPENDENT_AMBULATORY_CARE_PROVIDER_SITE_OTHER): Payer: Medicare Other

## 2023-05-07 VITALS — Ht 72.0 in | Wt 169.0 lb

## 2023-05-07 DIAGNOSIS — Z0000 Encounter for general adult medical examination without abnormal findings: Secondary | ICD-10-CM

## 2023-05-07 NOTE — Progress Notes (Signed)
Subjective:   Brian Matthews is a 72 y.o. male who presents for Medicare Annual/Subsequent preventive examination.  Visit Complete: Virtual I connected with  Brian Matthews on 05/07/23 by a audio enabled telemedicine application and verified that I am speaking with the correct person using two identifiers.  Patient Location: Home  Provider Location: Office/Clinic  I discussed the limitations of evaluation and management by telemedicine. The patient expressed understanding and agreed to proceed.  Vital Signs: Because this visit was a virtual/telehealth visit, some criteria may be missing or patient reported. Any vitals not documented were not able to be obtained and vitals that have been documented are patient reported.  Patient Medicare AWV questionnaire was completed by the patient on 05/06/2023; I have confirmed that all information answered by patient is correct and no changes since this date.  Cardiac Risk Factors include: advanced age (>68men, >73 women);dyslipidemia;family history of premature cardiovascular disease;hypertension;male gender     Objective:    Today's Vitals   05/07/23 1424 05/07/23 1426  Weight: 169 lb (76.7 kg)   Height: 6' (1.829 m)   PainSc: 0-No pain 0-No pain   Body mass index is 22.92 kg/m.     05/07/2023    2:28 PM 12/25/2022   10:07 PM 12/15/2022    3:23 PM 08/23/2022    9:21 AM 10/02/2021   12:07 AM 02/21/2021    8:21 AM 12/25/2020    1:56 PM  Advanced Directives  Does Patient Have a Medical Advance Directive? Yes No No Yes No Yes No  Type of Estate agent of [place];Living will   Living will  --   Copy of Healthcare Power of Attorney in Chart? No - copy requested        Would patient like information on creating a medical advance directive?     No - Patient declined  No - Patient declined    Current Medications (verified) Outpatient Encounter Medications as of 05/07/2023  Medication Sig   Alpha-Lipoic Acid 200 MG  CAPS Take by mouth daily.   amLODipine (NORVASC) 5 MG tablet TAKE 1 TABLET(5 MG) BY MOUTH DAILY   Cholecalciferol (VITAMIN D-3) 125 MCG (5000 UT) TABS Take by mouth. 1 tablet 4-5 days per week   Collagen-Vitamin C (ULTRA COLLAGEN + C PO) Take by mouth daily.   Cranberry 500 MG TABS Take by mouth daily.   Misc Natural Products (GLUCOSAMINE CHOND COMPLEX/MSM) TABS Take by mouth daily.   Misc Natural Products (MENS PROSTATE HEALTH FORMULA PO) Take 1 tablet by mouth daily.   Multiple Vitamin (MULTIVITAMIN) tablet Take 1 tablet by mouth daily.   Omega-3 Fatty Acids (FISH OIL) 1200 MG CAPS Take by mouth. 1-2 per day   Plant Sterols and Stanols (CHOLESTOFF PO) Take 900 mg by mouth daily.   Saw Palmetto 450 MG CAPS Take 2 capsules by mouth daily.   triamterene-hydrochlorothiazide (MAXZIDE-25) 37.5-25 MG tablet TAKE 1 TABLET BY MOUTH DAILY   Turmeric (QC TUMERIC COMPLEX) 500 MG CAPS Take by mouth daily.   vitamin B-12 (CYANOCOBALAMIN) 500 MCG tablet Take 500 mcg by mouth daily. 1 tablet 4-5 days per week   No facility-administered encounter medications on file as of 05/07/2023.    Allergies (verified) Lisinopril and Penicillins   History: Past Medical History:  Diagnosis Date   Allergy    Broken wrist    Childhood asthma    Colon polyps    GERD (gastroesophageal reflux disease)    History of blood transfusion    Hx  of adenomatous polyp of colon 04/29/2017   Hypertension    Hyperthyroidism    Positive TB test    Past Surgical History:  Procedure Laterality Date   COLONOSCOPY  04/2017   Dr Leone Payor   SKIN BIOPSY     04/24/22   UPPER GASTROINTESTINAL ENDOSCOPY     [id number]   Family History  Problem Relation Age of Onset   Hypertension Mother    Arthritis Father        Rheumatoid Artritis   Heart Problems Father    Colon cancer Sister    Brain cancer Brother    Melanoma Brother    Thyroid disease Neg Hx    Esophageal cancer Neg Hx    Pancreatic cancer Neg Hx    Rectal cancer  Neg Hx    Stomach cancer Neg Hx    Crohn's disease Neg Hx    Colon polyps Neg Hx    Ulcerative colitis Neg Hx    Social History   Socioeconomic History   Marital status: Divorced    Spouse name: Not on file   Number of children: 0   Years of education: Not on file   Highest education level: Master's degree (e.g., MA, MS, MEng, MEd, MSW, MBA)  Occupational History   Occupation: Public librarian    Comment: Retired  Tobacco Use   Smoking status: Never    Passive exposure: Past (sister smoked 50 years ago)   Smokeless tobacco: Never  Vaping Use   Vaping status: Never Used  Substance and Sexual Activity   Alcohol use: No    Alcohol/week: 0.0 standard drinks of alcohol   Drug use: No   Sexual activity: Not on file  Other Topics Concern   Not on file  Social History Narrative   Right Handed    Lives in a two story home    Social Determinants of Health   Financial Resource Strain: Low Risk  (05/07/2023)   Overall Financial Resource Strain (CARDIA)    Difficulty of Paying Living Expenses: Not hard at all  Food Insecurity: No Food Insecurity (05/07/2023)   Hunger Vital Sign    Worried About Running Out of Food in the Last Year: Never true    Ran Out of Food in the Last Year: Never true  Transportation Needs: No Transportation Needs (05/07/2023)   PRAPARE - Administrator, Civil Service (Medical): No    Lack of Transportation (Non-Medical): No  Physical Activity: Sufficiently Active (05/07/2023)   Exercise Vital Sign    Days of Exercise per Week: 5 days    Minutes of Exercise per Session: 60 min  Stress: No Stress Concern Present (05/07/2023)   [employer] of Occupational Health - Occupational Stress Questionnaire    Feeling of Stress : Only a little  Social Connections: Socially Isolated (05/07/2023)   Social Connection and Isolation Panel [NHANES]    Frequency of Communication with Friends and Family: Twice a week    Frequency of Social Gatherings  with Friends and Family: Once a week    Attends Religious Services: Never    Database administrator or Organizations: No    Attends Engineer, structural: Never    Marital Status: Divorced    Tobacco Counseling Counseling given: Not Answered   Clinical Intake:  Pre-visit preparation completed: Yes  Pain : No/denies pain Pain Score: 0-No pain     BMI - recorded: 22.92 Nutritional Status: BMI of 19-24  Normal Nutritional Risks: None Diabetes: No  How often do you need to have someone help you when you read instructions, pamphlets, or other written materials from your doctor or pharmacy?: 1 - Never What is the last grade level you completed in school?: Masters Degree  Interpreter Needed?: No  Information entered by :: Siara Gorder N. Rolanda Campa, LPN.   Activities of Daily Living    05/07/2023    2:34 PM 05/06/2023    3:56 PM  In your present state of health, do you have any difficulty performing the following activities:  Hearing? 0 0  Vision? 0 0  Difficulty concentrating or making decisions? 0 0  Walking or climbing stairs? 0 0  Dressing or bathing? 0 0  Doing errands, shopping? 0 0  Preparing Food and eating ? N N  Using the Toilet? N N  In the past six months, have you accidently leaked urine? Y N  Comment once in awhile   Do you have problems with loss of bowel control? N N  Managing your Medications? N N  Managing your Finances? N N  Housekeeping or managing your Housekeeping? N N    Patient Care Team: Eden Emms, NP as PCP - General Lanier Prude, MD as PCP - Electrophysiology (Cardiology) Glendale Chard, DO as Consulting Physician (Neurology) Ander Purpura, OD as Consulting Physician (Optometry)  Indicate any recent Medical Services you may have received from other than [facility] providers in the past year (date may be approximate).     Assessment:   This is a routine wellness examination for Tamarion.  Hearing/Vision screen Hearing Screening  - Comments:: Patient has slight hearing difficulties in right ear; no hearing aids.  Vision Screening - Comments:: Wears rx glasses - up to date with routine eye exams with [facility] (Dr. Ander Purpura, Montez Hageman.)    Goals Addressed             This Visit's Progress    Patient Stated       Maintain my health, continue to eat healthy and stay independent.      Depression Screen    05/07/2023    2:30 PM 04/03/2023   11:26 AM 12/29/2022   12:08 PM 12/16/2022   11:20 AM 08/04/2022   12:18 PM 05/22/2022   11:16 AM 05/06/2021   10:24 AM  PHQ 2/9 Scores  PHQ - 2 Score 0 0 0 0 0 0 0  PHQ- 9 Score 0 0 2  1      Fall Risk    05/07/2023    2:30 PM 05/06/2023    3:56 PM 04/03/2023   11:26 AM 12/29/2022   12:09 PM 12/16/2022   11:20 AM  Fall Risk   Falls in the past year? 0 0 0 0 0  Number falls in past yr: 0  0 0 0  Injury with Fall? 0  0 0 0  Risk for fall due to : No Fall Risks  No Fall Risks No Fall Risks No Fall Risks  Follow up Falls prevention discussed  Falls evaluation completed Falls evaluation completed Falls evaluation completed    MEDICARE RISK AT HOME: Medicare Risk at Home Any stairs in or around the home?: Yes If so, are there any without handrails?: No Home free of loose throw rugs in walkways, pet beds, electrical cords, etc?: Yes Adequate lighting in your home to reduce risk of falls?: Yes Life alert?: No Use of a cane, walker or w/c?: No Grab bars in the bathroom?: No Shower chair or  bench in shower?: No Elevated toilet seat or a handicapped toilet?: No  TIMED UP AND GO:  Was the test performed?  No    Cognitive Function:    04/03/2023   11:19 AM  MMSE - Mini Mental State Exam  Orientation to time 5  Orientation to Place 5  Registration 3  Attention/ Calculation 5  Recall 3  Language- name 2 objects 2  Language- repeat 1  Language- follow 3 step command 3  Language- read & follow direction 1  Write a sentence 1  Copy design 1  Total score 30         05/07/2023    2:35 PM  6CIT Screen  What Year? 0 points  What month? 0 points  What time? 0 points  Count back from 20 0 points  Months in reverse 0 points  Repeat phrase 0 points  Total Score 0 points    Immunizations Immunization History  Administered Date(s) Administered   Fluad Quad(high Dose 65+) 03/17/2019, 03/24/2020, 04/19/2021, 03/25/2022   Fluad Trivalent(High Dose 65+) 04/03/2023   Influenza, High Dose Seasonal PF 04/01/2018   Influenza,inj,Quad PF,6+ Mos 03/23/2015, 02/22/2016, 03/26/2017   Moderna Covid-19 Fall Seasonal Vaccine 87yrs & older 02/24/2023   Moderna SARS-COV2 Booster Vaccination 12/22/2020   Moderna Sars-Covid-2 Vaccination 08/25/2019, 09/26/2019, 04/26/2020   Pneumococcal Conjugate-13 09/24/2017   Pneumococcal Polysaccharide-23 07/28/2008, 11/10/2018   Tdap 12/11/2008   Zoster Recombinant(Shingrix) 06/12/2021, 08/07/2021   Zoster, Live 09/24/2015    TDAP status: Due, Education has been provided regarding the importance of this vaccine. Advised may receive this vaccine at local pharmacy or Health Dept. Aware to provide a copy of the vaccination record if obtained from local pharmacy or Health Dept. Verbalized acceptance and understanding.  Flu Vaccine status: Up to date  Pneumococcal vaccine status: Up to date  Covid-19 vaccine status: Completed vaccines  Qualifies for Shingles Vaccine? Yes   Zostavax completed Yes   Shingrix Completed?: Yes  Screening Tests Health Maintenance  Topic Date Due   DTaP/Tdap/Td (2 - Td or Tdap) 12/12/2018   COVID-19 Vaccine (6 - 2023-24 season) 06/26/2023   Medicare Annual Wellness (AWV)  05/06/2024   Colonoscopy  05/16/2027   Pneumonia Vaccine 27+ Years old  Completed   INFLUENZA VACCINE  Completed   Hepatitis C Screening  Completed   Zoster Vaccines- Shingrix  Completed   HPV VACCINES  Aged Out    Health Maintenance  Health Maintenance Due  Topic Date Due   DTaP/Tdap/Td (2 - Td or Tdap)  12/12/2018    Colorectal cancer screening: Type of screening: Colonoscopy. Completed 05/15/2022. Repeat every 5 years  Lung Cancer Screening: (Low Dose CT Chest recommended if Age 91-80 years, 20 pack-year currently smoking OR have quit w/in 15years.) does not qualify.   Lung Cancer Screening Referral: no  Additional Screening:  Hepatitis C Screening: does qualify; Completed 02/22/2016  Vision Screening: Recommended annual ophthalmology exams for early detection of glaucoma and other disorders of the eye. Is the patient up to date with their annual eye exam?  Yes  Who is the provider or what is the name of the office in which the patient attends annual eye exams? Ander Purpura, OD at [facility] If pt is not established with a provider, would they like to be referred to a provider to establish care? No .   Dental Screening: Recommended annual dental exams for proper oral hygiene  Diabetic Foot Exam: N/A  Community Resource Referral / Chronic Care Management: CRR required this  visit?  No   CCM required this visit?  No     Plan:     I have personally reviewed and noted the following in the patient's chart:   Medical and social history Use of alcohol, tobacco or illicit drugs  Current medications and supplements including opioid prescriptions. Patient is not currently taking opioid prescriptions. Functional ability and status Nutritional status Physical activity Advanced directives List of other physicians Hospitalizations, surgeries, and ER visits in previous 12 months Vitals Screenings to include cognitive, depression, and falls Referrals and appointments  In addition, I have reviewed and discussed with patient certain preventive protocols, quality metrics, and best practice recommendations. A written personalized care plan for preventive services as well as general preventive health recommendations were provided to patient.     Mickeal Needy, LPN   16/03/[id number]    After Visit Summary: (MyChart) Due to this being a telephonic visit, the after visit summary with patients personalized plan was offered to patient via MyChart   Nurse Notes: None

## 2023-05-07 NOTE — Patient Instructions (Signed)
Brian Matthews , Thank you for taking time to come for your Medicare Wellness Visit. I appreciate your ongoing commitment to your health goals. Please review the following plan we discussed and let me know if I can assist you in the future.   Referrals/Orders/Follow-Ups/Clinician Recommendations: No  This is a list of the screening recommended for you and due dates:  Health Maintenance  Topic Date Due   DTaP/Tdap/Td vaccine (2 - Td or Tdap) 12/12/2018   COVID-19 Vaccine (6 - 2023-24 season) 06/26/2023   Medicare Annual Wellness Visit  05/06/2024   Colon Cancer Screening  05/16/2027   Pneumonia Vaccine  Completed   Flu Shot  Completed   Hepatitis C Screening  Completed   Zoster (Shingles) Vaccine  Completed   HPV Vaccine  Aged Out    Advanced directives: (Copy Requested) Please bring a copy of your health care power of attorney and living will to the office to be added to your chart at your convenience.  Next Medicare Annual Wellness Visit scheduled for next year: No

## 2023-05-12 ENCOUNTER — Ambulatory Visit (INDEPENDENT_AMBULATORY_CARE_PROVIDER_SITE_OTHER): Payer: Medicare Other | Admitting: Neurology

## 2023-05-12 ENCOUNTER — Encounter: Payer: Self-pay | Admitting: Neurology

## 2023-05-12 VITALS — BP 129/80 | HR 84 | Ht 72.0 in | Wt 172.0 lb

## 2023-05-12 DIAGNOSIS — R488 Other symbolic dysfunctions: Secondary | ICD-10-CM | POA: Diagnosis not present

## 2023-05-12 DIAGNOSIS — R519 Headache, unspecified: Secondary | ICD-10-CM

## 2023-05-12 DIAGNOSIS — Q279 Congenital malformation of peripheral vascular system, unspecified: Secondary | ICD-10-CM

## 2023-05-12 NOTE — Progress Notes (Unsigned)
Follow-up Visit   Date: 05/12/2023    Brian Matthews MRN: [id number] DOB: Nov 24, 1950    Brian Matthews is a 72 y.o. right-handed male with hypertension returning to the clinic for follow-up of abnormal MRI.  The patient was accompanied to the clinic by self.  IMPRESSION/PLAN: Spells of agraphia with alexia.  MRI brain does does not show structural disease of the brain to explain symptoms. I will check MRA head and neck to look for any flow limiting stenosis. 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  This has been stable on imaging since 2023 and he seems to be asymptomatic from this.  I will refer him to ENT for their opinion.  Return to clinic in 3 months  --------------------------------------------- UPDATE 05/12/2023:  He reports having two spells of difficulty with reading and reading out loud, but was able to speak clearly.  It lasted a few minutes and resolved.  Another time, he had difficulty with writing when working on a crossword puzzle.  It lasted a few minutes and resolved.  He had MRI brain which shows abnormality involving the left masticator space, concerning for vascular malformation.  He has noticed a new dull headache over the eyebrows which is daily.  It is very mild and does take medication for it.   Medications:  Current Outpatient Medications on File Prior to Visit  Medication Sig Dispense Refill   Alpha-Lipoic Acid 200 MG CAPS Take by mouth daily.     amLODipine (NORVASC) 5 MG tablet TAKE 1 TABLET(5 MG) BY MOUTH DAILY 90 tablet 1   Cholecalciferol (VITAMIN D-3) 125 MCG (5000 UT) TABS Take by mouth. 1 tablet 4-5 days per week     Collagen-Vitamin C (ULTRA COLLAGEN + C PO) Take by mouth daily.     Cranberry 500 MG TABS Take by mouth daily.     Misc Natural Products (GLUCOSAMINE CHOND COMPLEX/MSM) TABS Take by mouth daily.     Misc Natural Products (MENS PROSTATE HEALTH FORMULA PO) Take 1 tablet by  mouth daily.     Multiple Vitamin (MULTIVITAMIN) tablet Take 1 tablet by mouth daily.     Omega-3 Fatty Acids (FISH OIL) 1200 MG CAPS Take by mouth. 1-2 per day     Plant Sterols and Stanols (CHOLESTOFF PO) Take 900 mg by mouth daily.     Saw Palmetto 450 MG CAPS Take 2 capsules by mouth daily.     triamterene-hydrochlorothiazide (MAXZIDE-25) 37.5-25 MG tablet TAKE 1 TABLET BY MOUTH DAILY 90 tablet 3   Turmeric (QC TUMERIC COMPLEX) 500 MG CAPS Take by mouth daily.     vitamin B-12 (CYANOCOBALAMIN) 500 MCG tablet Take 500 mcg by mouth daily. 1 tablet 4-5 days per week     No current facility-administered medications on file prior to visit.    Allergies:  Allergies  Allergen Reactions   Lisinopril Cough   Penicillins Other (See Comments)    Pt does not remember reaction    Vital Signs:  BP 129/80   Pulse 84   Ht 6' (1.829 m)   Wt 172 lb (78 kg)   SpO2 98%   BMI 23.33 kg/m    Neurological Exam: MENTAL STATUS including orientation to time, place, person, recent and remote memory, attention span and concentration, language, and fund of knowledge is normal.  Speech is not dysarthric.  CRANIAL NERVES:  No visual field defects.  Pupils equal round and reactive to light.  Normal conjugate, extra-ocular eye movements in all  directions of gaze.  No ptosis.  Face is symmetric. Palate elevates symmetrically.  Tongue is midline.  MOTOR:  Motor strength is 5/5 in all extremities.  No atrophy, fasciculations or abnormal movements.  No pronator drift.  Tone is normal.    MSRs:  Reflexes are 2+/4 throughout.  SENSORY:  Intact to vibration throughout .  COORDINATION/GAIT:  Normal finger-to- nose-finger.  Intact rapid alternating movements bilaterally.  Gait narrow based and stable.   Data: MRI brain wo contrast 04/20/2023: 1. No evidence of an acute intracranial abnormality. 2. Mild chronic small vessel ischemic changes within the cerebral white matter, slightly progressed from the prior  brain MRI of 10/02/2021. 3. Mild generalized parenchymal volume loss. 4. 13 mm masticator space lesion on the left, suspicious for a nerve sheath tumor but incompletely assessed on this non-contrast examination. Consider an MRI of the face (with and without contrast) for further evaluation.  Total time spent reviewing records, interview, history/exam, documentation, and coordination of care on day of encounter:  40 min   Thank you for allowing me to participate in patient's care.  If I can answer any additional questions, I would be pleased to do so.    Sincerely,    Erin Obando K. Allena Katz, DO

## 2023-05-12 NOTE — Patient Instructions (Signed)
ENT referral  MRA head and neck

## 2023-05-13 ENCOUNTER — Other Ambulatory Visit: Payer: Self-pay | Admitting: Nurse Practitioner

## 2023-05-13 DIAGNOSIS — I10 Essential (primary) hypertension: Secondary | ICD-10-CM

## 2023-05-19 ENCOUNTER — Ambulatory Visit: Payer: Medicare Other | Admitting: Neurology

## 2023-05-25 ENCOUNTER — Encounter (INDEPENDENT_AMBULATORY_CARE_PROVIDER_SITE_OTHER): Payer: Self-pay | Admitting: Otolaryngology

## 2023-06-05 ENCOUNTER — Ambulatory Visit
Admission: RE | Admit: 2023-06-05 | Discharge: 2023-06-05 | Disposition: A | Discharge status: Home / Self Care | Payer: Medicare Other | Source: Ambulatory Visit | Attending: Neurology | Admitting: Neurology

## 2023-06-05 DIAGNOSIS — Q279 Congenital malformation of peripheral vascular system, unspecified: Secondary | ICD-10-CM

## 2023-06-05 DIAGNOSIS — R488 Other symbolic dysfunctions: Secondary | ICD-10-CM

## 2023-06-05 DIAGNOSIS — R519 Headache, unspecified: Secondary | ICD-10-CM

## 2023-06-05 MED ORDER — GADOPICLENOL 0.5 MMOL/ML IV SOLN
10.0000 mL | Freq: Once | INTRAVENOUS | Status: AC | PRN
Start: 2023-06-05 — End: 2023-06-05
  Administered 2023-06-05: 8 mL via INTRAVENOUS

## 2023-06-15 ENCOUNTER — Encounter: Payer: Self-pay | Admitting: Nurse Practitioner

## 2023-06-15 ENCOUNTER — Encounter: Payer: Self-pay | Admitting: Neurology

## 2023-06-22 NOTE — Telephone Encounter (Signed)
 Pt called in to try and get results of MRI. He has an ENT appt on 06/24/23 that is supposed to use these results. Dr. Allena Katz sent him to the ENT. He stated a mychart message would be ok or a phone call.

## 2023-06-22 NOTE — Telephone Encounter (Signed)
 Please let pt know that MRA head and neck results are back and looks good, no specific abnormalities of the vessels were seen.  Keep appt with ENT for their opinion. Thanks.

## 2023-06-24 ENCOUNTER — Encounter (INDEPENDENT_AMBULATORY_CARE_PROVIDER_SITE_OTHER): Payer: Self-pay

## 2023-06-24 ENCOUNTER — Ambulatory Visit (INDEPENDENT_AMBULATORY_CARE_PROVIDER_SITE_OTHER): Payer: Medicare Other | Admitting: Otolaryngology

## 2023-06-24 VITALS — BP 128/72 | HR 69 | Resp 19 | Ht 72.0 in | Wt 170.0 lb

## 2023-06-24 DIAGNOSIS — R220 Localized swelling, mass and lump, head: Secondary | ICD-10-CM

## 2023-06-24 NOTE — Progress Notes (Signed)
 Dear Dr. Tobie, Here is my assessment for our mutual patient, Brian Matthews. Thank you for allowing me the opportunity to care for your patient. Please do not hesitate to contact me should you have any other questions. Sincerely, Dr. Eldora Tobie  Otolaryngology Clinic Note Referring provider: Dr. Tobie HPI:  Brian Matthews is a 73 y.o. male kindly referred by Dr. Tobie for evaluation of left masticator space lesion, .  This was found incidentally in 2023 and then again noted in 2024 after undergoing MRI for ataxia and agraphia spells on an MRI. She reports that he is not having any trouble. O Patient otherwise denies: oral pain, neck lesions, trismus, history of other lesions. - dysphagia, odynophagia, aspiration episodes or PNA, need for Heimlich, unintentional weight loss - changes in voice, shortness of breath, hemoptysis, tobacco or significant alcohol history - ear pain, neck masses  No B symptoms.  H&N Surgery: denies Personal or FHx of bleeding dz or anesthesia difficulty: no  GLP-1: no AP/AC: no  Tobacco: no.. Lives in [place], [place]  PMHx: GERD, HTN, Agraphia episodes, Headaches  Independent Review of Additional Tests or Records:  Dr. JONETTA Tobie (05/12/2023): Noted agraphia and alexia spells; left masticator space malformation, query vascular malformation; Ref to ENT CBC and CMP 04/03/2023: reviewed, wnl RF, CCP, CRP, Sed Rate 12/16/2022: wnl MRI Head 2023 independently reviewed (no contrast) alu:ozqu ovoid masticator space lesion posterior to the maxillary sinus, rounded, well demarcated, no other lymphadenopathy noted but incomplete eval of neck; does not appear to restrict diffusion MRI Brain and MRI Face w/ and wo contrast 04/2023 independently reviewed: agree with read, 16x2mm cystic lesion left masticator space - no enhacement (significant) on post con images, likely favored to be vascular malformation; does appear to be a plane between it and surrounding  masticator muscles MRI Angio Neck and Head 05/2023 independently reviewed with special attention to neck for lymphadenopathy or other vascular lesions - agree with read - no aneurysm or vascular malformation, no high flow area or arterial flow on left masticator space  PMH/Meds/All/SocHx/FamHx/ROS:   Past Medical History:  Diagnosis Date   Allergy    Broken wrist    Childhood asthma    Colon polyps    GERD (gastroesophageal reflux disease)    History of blood transfusion    Hx of adenomatous polyp of colon 04/29/2017   Hypertension    Hyperthyroidism    Positive TB test      Past Surgical History:  Procedure Laterality Date   COLONOSCOPY  04/2017   Dr Avram   SKIN BIOPSY     04/24/22   UPPER GASTROINTESTINAL ENDOSCOPY     [id number]    Family History  Problem Relation Age of Onset   Hypertension Mother    Arthritis Father        Rheumatoid Artritis   Heart Problems Father    Colon cancer Sister    Brain cancer Brother    Melanoma Brother    Thyroid  disease Neg Hx    Esophageal cancer Neg Hx    Pancreatic cancer Neg Hx    Rectal cancer Neg Hx    Stomach cancer Neg Hx    Crohn's disease Neg Hx    Colon polyps Neg Hx    Ulcerative colitis Neg Hx      Social Connections: Socially Isolated (05/07/2023)   Social Connection and Isolation Panel [NHANES]    Frequency of Communication with Friends and Family: Twice a week    Frequency of Social Gatherings  with Friends and Family: Once a week    Attends Religious Services: Never    Database Administrator or Organizations: No    Attends Banker Meetings: Never    Marital Status: Divorced      Current Outpatient Medications:    Alpha-Lipoic Acid 200 MG CAPS, Take by mouth daily., Disp: , Rfl:    amLODipine  (NORVASC ) 5 MG tablet, TAKE 1 TABLET(5 MG) BY MOUTH DAILY, Disp: 90 tablet, Rfl: 1   Cholecalciferol (VITAMIN D -3) 125 MCG (5000 UT) TABS, Take by mouth. 1 tablet 4-5 days per week, Disp: , Rfl:     Collagen-Vitamin C (ULTRA COLLAGEN + C PO), Take by mouth daily., Disp: , Rfl:    Cranberry 500 MG TABS, Take by mouth daily., Disp: , Rfl:    Misc Natural Products (GLUCOSAMINE CHOND COMPLEX/MSM) TABS, Take by mouth daily., Disp: , Rfl:    Misc Natural Products (MENS PROSTATE HEALTH FORMULA PO), Take 1 tablet by mouth daily., Disp: , Rfl:    Multiple Vitamin (MULTIVITAMIN) tablet, Take 1 tablet by mouth daily., Disp: , Rfl:    Omega-3 Fatty Acids (FISH OIL) 1200 MG CAPS, Take by mouth. 1-2 per day, Disp: , Rfl:    Plant Sterols and Stanols (CHOLESTOFF PO), Take 900 mg by mouth daily., Disp: , Rfl:    Saw Palmetto 450 MG CAPS, Take 2 capsules by mouth daily., Disp: , Rfl:    triamterene -hydrochlorothiazide (MAXZIDE-25) 37.5-25 MG tablet, TAKE 1 TABLET BY MOUTH DAILY, Disp: 90 tablet, Rfl: 3   Turmeric (QC TUMERIC COMPLEX) 500 MG CAPS, Take by mouth daily., Disp: , Rfl:    vitamin B-12 (CYANOCOBALAMIN ) 500 MCG tablet, Take 500 mcg by mouth daily. 1 tablet 4-5 days per week, Disp: , Rfl:    Physical Exam:   BP 128/72 (BP Location: Right Arm, Patient Position: Sitting, Cuff Size: Normal)   Pulse 69   Resp 19   Ht 6' (1.829 m)   Wt 170 lb (77.1 kg)   SpO2 97%   BMI 23.06 kg/m   Salient findings:  CN II-XII intact  Bilateral EAC clear and TM intact with well pneumatized middle ear spaces Anterior rhinoscopy: Septum intact No lesions of oral cavity/oropharynx; dentition good; unable to feel the masticator space lesion (though albeit would be difficult); no trismus, no skin changes over RMT or mandible area No obviously palpable neck masses/lymphadenopathy/thyromegaly No respiratory distress or stridor No numbness, palate asymmery  Seprately Identifiable Procedures:  Procedure Note Pre-procedure diagnosis: left masticator space lesion, concern for secondary lesion Post-procedure diagnosis: Same Procedure: Transnasal Fiberoptic Laryngoscopy, CPT 31575 - Mod 25 Indication: see  above Complications: None apparent EBL: 0 mL  The procedure was undertaken to further evaluate complaints above, with mirror exam inadequate for appropriate examination due to gag reflex and poor patient tolerance  Procedure:  Patient was identified as correct patient. Verbal consent was obtained. The nose was sprayed with oxymetazoline and 4% lidocaine. The The flexible laryngoscope was passed through the nose to view the nasal cavity, pharynx (oropharynx, hypopharynx) and larynx.  The larynx was examined at rest and during multiple phonatory tasks. Documentation was obtained and reviewed with patient. The scope was removed. The patient tolerated the procedure well.  Findings: The nasal cavity and nasopharynx did not reveal any masses or lesions, mucosa appeared to be without obvious lesions. The tongue base, pharyngeal walls, piriform sinuses, vallecula, epiglottis and postcricoid region are normal in appearance; no nasal lesions noted on left or right; bilateral  MM without any purulence or abnormalities. The visualized portion of the subglottis and proximal trachea is widely patent. The vocal folds are mobile bilaterally. There are no lesions on the free edge of the vocal folds nor elsewhere in the larynx worrisome for malignancy.     Electronically signed by: Eldora KATHEE Blanch, MD 07/05/2023 5:37 PM   Impression & Plans:  Ziere Docken is a 73 y.o. male with:  1. Facial mass    Left masticator space lesion, appears to be stable on multiple MRIs (2023, 2024), and he is asymptomatic from this. His agraphia/neuro symptoms I would not attribute to this. Ddx is wide and can include vascular malformation, lymphatic malformation, malignant (lymphoma, or perhaps schwannoma as well.  - Offered referral for second opinion to tertiary care center for further evaluation given this is an uncommon lesion and we don't have a diagnosis definitively - We also discussed options - Bx (FNA hard to reach but  could consider open v/s antral window approach perhaps?) v/s observation - He declined both and would like to observe. Given stability, would like to get an MRI in 42mo-2y and follow up after. - If he wishes to be referred, advised to call back and I'm happy to do so.  See below regarding exact medications prescribed this encounter including dosages and route: No orders of the defined types were placed in this encounter.     Thank you for allowing me the opportunity to care for your patient. Please do not hesitate to contact me should you have any other questions.  Sincerely, Eldora Blanch, MD Otolarynoglogist (ENT), [facility] Phone: [phone] Fax: [phone]  07/05/2023, 5:37 PM   MDM:  Level 4 Complexity/Problems addressed: mod - new problem, unknown prognosis Data complexity: mod - independent review and interpretation of imaging; review of notes, albs - Morbidity: mod given concern for carcinoma - Prescription Drug prescribed or managed: no

## 2023-08-12 ENCOUNTER — Encounter: Payer: Self-pay | Admitting: Neurology

## 2023-08-12 ENCOUNTER — Ambulatory Visit (INDEPENDENT_AMBULATORY_CARE_PROVIDER_SITE_OTHER): Payer: Medicare Other | Admitting: Neurology

## 2023-08-12 VITALS — BP 124/74 | HR 73 | Ht 72.0 in | Wt 173.0 lb

## 2023-08-12 DIAGNOSIS — R488 Other symbolic dysfunctions: Secondary | ICD-10-CM | POA: Diagnosis not present

## 2023-08-12 DIAGNOSIS — R220 Localized swelling, mass and lump, head: Secondary | ICD-10-CM

## 2023-08-12 NOTE — Progress Notes (Signed)
 Follow-up Visit   Date: 08/12/2023    Brian Matthews MRN: [id number] DOB: 07-02-1950    Brian Matthews is a 73 y.o. right-handed male with hypertension returning to the clinic for follow-up of abnormal MRI.  The patient was accompanied to the clinic by self.  IMPRESSION/PLAN: History of agraphia with alexia with no recurrence of symptoms.  MRI brain does does not show structural disease of the brain to explain symptoms. Continue to monitor.   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  He was seen by ENT who discussed second opinion at an academic center.  He has many questions regarding further work-up (?biopsy) which I am unable to answer.  Recommend that he follow-up with ENT.   Return to clinic as needed  --------------------------------------------- UPDATE 05/12/2023:  He reports having two spells of difficulty with reading and reading out loud, but was able to speak clearly.  It lasted a few minutes and resolved.  Another time, he had difficulty with writing when working on a crossword puzzle.  It lasted a few minutes and resolved.  He had MRI brain which shows abnormality involving the left masticator space, concerning for vascular malformation.  He has noticed a new dull headache over the eyebrows which is daily.  It is very mild and does take medication for it.   UPDATE 08/12/2023:  He is here for follow-up visit.  He does not have any more spells of difficulty with speech or reading.  He saw ENT of left masticator mass and has many questions related to this.  He is seeking a definitive diagnosis.  He continues to have mild headache which is noticeable at night time.  Headache is bifrontal and over the left temple.  Pain is mild and does not treat them.   Medications:  Current Outpatient Medications on File Prior to Visit  Medication Sig Dispense Refill   Alpha-Lipoic Acid 200 MG CAPS Take by mouth daily.      amLODipine (NORVASC) 5 MG tablet TAKE 1 TABLET(5 MG) BY MOUTH DAILY 90 tablet 1   Cholecalciferol (VITAMIN D-3) 125 MCG (5000 UT) TABS Take by mouth. 1 tablet 4-5 days per week     Collagen-Vitamin C (ULTRA COLLAGEN + C PO) Take by mouth daily.     Cranberry 500 MG TABS Take by mouth daily.     Misc Natural Products (GLUCOSAMINE CHOND COMPLEX/MSM) TABS Take by mouth daily.     Misc Natural Products (MENS PROSTATE HEALTH FORMULA PO) Take 1 tablet by mouth daily.     Multiple Vitamin (MULTIVITAMIN) tablet Take 1 tablet by mouth daily.     Omega-3 Fatty Acids (FISH OIL) 1200 MG CAPS Take by mouth. 1-2 per day     Plant Sterols and Stanols (CHOLESTOFF PO) Take 900 mg by mouth daily.     Saw Palmetto 450 MG CAPS Take 2 capsules by mouth daily.     triamterene-hydrochlorothiazide (MAXZIDE-25) 37.5-25 MG tablet TAKE 1 TABLET BY MOUTH DAILY 90 tablet 3   Turmeric (QC TUMERIC COMPLEX) 500 MG CAPS Take by mouth daily.     vitamin B-12 (CYANOCOBALAMIN) 500 MCG tablet Take 500 mcg by mouth daily. 1 tablet 4-5 days per week     No current facility-administered medications on file prior to visit.    Allergies:  Allergies  Allergen Reactions   Lisinopril Cough   Penicillins Other (See Comments)    Pt does not remember reaction    Vital Signs:  BP 124/74  Pulse 73   Ht 6' (1.829 m)   Wt 173 lb (78.5 kg)   SpO2 99%   BMI 23.46 kg/m    Neurological Exam: MENTAL STATUS including orientation to time, place, person, recent and remote memory, attention span and concentration, language, and fund of knowledge is normal.  Speech is not dysarthric.  CRANIAL NERVES:  No visual field defects.  Pupils equal round and reactive to light.  Normal conjugate, extra-ocular eye movements in all directions of gaze.  No ptosis.  Face is symmetric. Palate elevates symmetrically.  Tongue is midline.  MOTOR:  Motor strength is 5/5 in all extremities.  No atrophy, fasciculations or abnormal movements.  No pronator  drift.  Tone is normal.    MSRs:  Reflexes are 2+/4 throughout.  COORDINATION/GAIT:  Gait narrow based and stable.   Data: MRI brain wo contrast 04/20/2023: 1. No evidence of an acute intracranial abnormality. 2. Mild chronic small vessel ischemic changes within the cerebral white matter, slightly progressed from the prior brain MRI of 10/02/2021. 3. Mild generalized parenchymal volume loss. 4. 13 mm masticator space lesion on the left, suspicious for a nerve sheath tumor but incompletely assessed on this non-contrast examination. Consider an MRI of the face (with and without contrast) for further evaluation.  MRI face 04/22/2023:  A 16 x 9 mm lobulated, ovoid, cystic lesion in the left masticator space without significant enhancement on immediate postcontrast images, but with some filling in on subsequent postcontrast images. This is favored to represent a slow flow vascular malformation, such as a venous or venolymphatic malformation.  MRA head 06/22/2023: Normal intracranial MR angiography of the large and medium size vessels. No aneurysm or vascular malformation. Attention to arterial flow in the region of the left masticator space region does not show any high flow component.   MRA neck 06/22/2023:   Normal MR angiography of the neck. No sign of arterial flow in the region of the left masticator space finding on prior imaging.    Thank you for allowing me to participate in patient's care.  If I can answer any additional questions, I would be pleased to do so.    Sincerely,    Orion Mole K. Allena Katz, DO

## 2023-08-20 ENCOUNTER — Encounter: Payer: Self-pay | Admitting: Nurse Practitioner

## 2023-08-22 ENCOUNTER — Encounter (HOSPITAL_BASED_OUTPATIENT_CLINIC_OR_DEPARTMENT_OTHER): Payer: Self-pay | Admitting: Emergency Medicine

## 2023-08-22 ENCOUNTER — Emergency Department (HOSPITAL_BASED_OUTPATIENT_CLINIC_OR_DEPARTMENT_OTHER)
Admission: EM | Admit: 2023-08-22 | Discharge: 2023-08-22 | Disposition: A | Discharge status: Home / Self Care | Attending: Emergency Medicine | Admitting: Emergency Medicine

## 2023-08-22 ENCOUNTER — Other Ambulatory Visit: Payer: Self-pay

## 2023-08-22 DIAGNOSIS — L309 Dermatitis, unspecified: Secondary | ICD-10-CM | POA: Diagnosis not present

## 2023-08-22 DIAGNOSIS — R21 Rash and other nonspecific skin eruption: Secondary | ICD-10-CM | POA: Diagnosis present

## 2023-08-22 MED ORDER — TRIAMCINOLONE ACETONIDE 0.1 % EX CREA: 1.0000 | TOPICAL_CREAM | Freq: Two times a day (BID) | CUTANEOUS | 0 refills | Status: AC

## 2023-08-22 NOTE — ED Provider Notes (Signed)
 [facility] EMERGENCY DEPARTMENT AT [facility] Provider Note   CSN: [id number] Arrival date & time: 08/22/23  1145     History  Chief Complaint  Patient presents with   Rash    Won Brian Matthews is a 73 y.o. male.  HPI 73 year old male presents today complaining of rash under his left arm.  He has noted a scaly area in the left axilla.  It has been present for Matthews1/2 weeks.  It has not spread.  It is not particularly painful or itchy.  He has not noted any significant swelling.  He has not had this in the past.  He states that he contacted his primary care provider today and was told to have it looked at if it was not improving.  He has no known new contacts and did not use his deodorant today in case it was this.  However, he states he has not had any change in the type of deodorant that he is using.     Home Medications Prior to Admission medications   Medication Sig Start Date End Date Taking? Authorizing Provider  triamcinolone cream (KENALOG) 0.1 % Apply 1 Application topically 2 (two) times daily. 08/22/23  Yes Margarita Grizzle, MD  Alpha-Lipoic Acid 200 MG CAPS Take by mouth daily.    [provider]  amLODipine (NORVASC) 5 MG tablet TAKE 1 TABLET(5 MG) BY MOUTH DAILY 05/13/23   Eden Emms, NP  Cholecalciferol (VITAMIN D-3) 125 MCG (5000 UT) TABS Take by mouth. 1 tablet 4-5 days per week    [provider]  Collagen-Vitamin C (ULTRA COLLAGEN + C PO) Take by mouth daily.    [provider]  Cranberry 500 MG TABS Take by mouth daily.    [provider]  Misc Natural Products (GLUCOSAMINE CHOND COMPLEX/MSM) TABS Take by mouth daily.    [provider]  Misc Natural Products (MENS PROSTATE HEALTH FORMULA PO) Take 1 tablet by mouth daily.    [provider]  Multiple Vitamin (MULTIVITAMIN) tablet Take 1 tablet by mouth daily.    [provider]  Omega-3 Fatty Acids (FISH OIL) 1200 MG CAPS Take by mouth. Matthews2 per  day    [provider]  Plant Sterols and Stanols (CHOLESTOFF PO) Take 900 mg by mouth daily.    [provider]  Saw Palmetto 450 MG CAPS Take 2 capsules by mouth daily.    [provider]  triamterene-hydrochlorothiazide (MAXZIDE-25) 37.5-25 MG tablet TAKE 1 TABLET BY MOUTH DAILY 02/13/23   Eden Emms, NP  Turmeric (QC TUMERIC COMPLEX) 500 MG CAPS Take by mouth daily.    [provider]  vitamin B-12 (CYANOCOBALAMIN) 500 MCG tablet Take 500 mcg by mouth daily. 1 tablet 4-5 days per week    [provider]      Allergies    Lisinopril and Penicillins    Review of Systems   Review of Systems  Physical Exam Updated Vital Signs BP (!) 145/80 (BP Location: Right Arm)   Pulse 80   Temp 98.2 F (36.8 C) (Oral)   Resp 16   Ht 1.829 m (6')   Wt 78.5 kg   SpO2 100%   BMI 23.47 kg/m  Physical Exam Vitals and nursing note reviewed.  Constitutional:      Appearance: He is well-developed.  HENT:     Head: Normocephalic and atraumatic.     Right Ear: External ear normal.     Left Ear: External ear normal.  Nose: Nose normal.  Eyes:     Extraocular Movements: Extraocular movements intact.  Neck:     Trachea: No tracheal deviation.  Pulmonary:     Effort: Pulmonary effort is normal.  Musculoskeletal:        General: Normal range of motion.  Skin:    General: Skin is warm and dry.     Comments: 2 x 3 cm area of mild excoriation in the left axilla.  There is no fluctuance, warmth, delineated erythema or adenopathy noted  Neurological:     Mental Status: He is alert and oriented to person, place, and time.  Psychiatric:        Mood and Affect: Mood normal.        Behavior: Behavior normal.     ED Results / Procedures / Treatments   Labs (all labs ordered are listed, but only abnormal results are displayed) Labs Reviewed - No data to display  EKG None  Radiology No results found.  Procedures Procedures    Medications  Ordered in ED Medications - No data to display  ED Course/ Medical Decision Making/ A&P                                 Medical Decision Making  73 year old male presents today complaining of rash to left axillary area.  Differential diagnosis includes but is not limited to infection, abscess, cellulitis, dermatitis.  Based on physical exam suspect that this is dermatitis and will start on steroid cream.  I he has been advised of return precautions and need for follow-up.  He will call his dermatologist or primary care doctor if he is not improved by Monday or is worse at any time.       Final Clinical Impression(s) / ED Diagnoses Final diagnoses:  Dermatitis    Rx / DC Orders ED Discharge Orders          Ordered    triamcinolone cream (KENALOG) 0.1 %  2 times daily        08/22/23 1337              Margarita Grizzle, MD 08/22/23 1431

## 2023-08-22 NOTE — ED Triage Notes (Signed)
 Pt via pov from home with rash under left arm x 8-9 days. Pt states there is a small scaly spot underneath it. Pt denies pain, itching. Pt alert & oriented, nad noted.

## 2023-08-22 NOTE — Discharge Instructions (Signed)
 Please use steroid cream as prescribed.  Return if you are having any spreading redness, swelling, pain, or fever.  Recheck with your primary care doctor on Monday or call your dermatologist for follow-up if not improved

## 2023-09-25 ENCOUNTER — Encounter: Payer: Self-pay | Admitting: Neurology

## 2023-09-25 ENCOUNTER — Encounter: Payer: Self-pay | Admitting: Nurse Practitioner

## 2023-10-07 ENCOUNTER — Other Ambulatory Visit: Payer: Self-pay

## 2023-10-07 DIAGNOSIS — R220 Localized swelling, mass and lump, head: Secondary | ICD-10-CM

## 2023-10-07 DIAGNOSIS — Q279 Congenital malformation of peripheral vascular system, unspecified: Secondary | ICD-10-CM

## 2023-10-07 DIAGNOSIS — R488 Other symbolic dysfunctions: Secondary | ICD-10-CM

## 2023-10-07 DIAGNOSIS — R519 Headache, unspecified: Secondary | ICD-10-CM

## 2023-10-09 ENCOUNTER — Ambulatory Visit (INDEPENDENT_AMBULATORY_CARE_PROVIDER_SITE_OTHER): Admitting: Neurology

## 2023-10-09 DIAGNOSIS — R220 Localized swelling, mass and lump, head: Secondary | ICD-10-CM

## 2023-10-09 DIAGNOSIS — R519 Headache, unspecified: Secondary | ICD-10-CM

## 2023-10-09 DIAGNOSIS — Q279 Congenital malformation of peripheral vascular system, unspecified: Secondary | ICD-10-CM

## 2023-10-09 DIAGNOSIS — R488 Other symbolic dysfunctions: Secondary | ICD-10-CM | POA: Diagnosis not present

## 2023-10-09 NOTE — Progress Notes (Signed)
 EEG complete and ready for review.

## 2023-10-21 NOTE — Procedures (Signed)
 ELECTROENCEPHALOGRAM REPORT  Date of Study: 10/09/2023  Patient's Name: Brian Matthews MRN: [id number] Date of Birth: 07/20/1950  Referring Provider: Dr. Reyna Cava  Clinical History: This is a 73 year old man with recurrent episodes of difficulty reading/speaking clearly. EEG for classification.  CNS Active Medications: none  Technical Summary: A multichannel digital 1-hour EEG recording measured by the international 10-20 system with electrodes applied with paste and impedances below 5000 ohms performed in our laboratory with EKG monitoring in an awake and asleep patient.  Hyperventilation was not performed. Photic stimulation was performed.  The digital EEG was referentially recorded, reformatted, and digitally filtered in a variety of bipolar and referential montages for optimal display.    Description: The patient is awake and asleep during the recording.  During maximal wakefulness, there is a symmetric, medium voltage 10 Hz posterior dominant rhythm that attenuates with eye opening.  The record is symmetric.  During drowsiness and sleep, there is an increase in theta slowing of the background, with shifting asymmetry over the bilateral temporal regions, left greater than right.  Vertex waves and symmetric sleep spindles were seen. Photic stimulation did not elicit any abnormalities.  There were no epileptiform discharges or electrographic seizures seen.    EKG lead was unremarkable.  Impression: This 1-hour awake and asleep EEG is within normal limits for age.  Clinical Correlation: A normal EEG does not exclude a clinical diagnosis of epilepsy.  If further clinical questions remain, prolonged EEG may be helpful.  Clinical correlation is advised.   Rayfield Cairo, M.D.

## 2023-11-05 ENCOUNTER — Other Ambulatory Visit: Payer: Self-pay | Admitting: Nurse Practitioner

## 2023-11-05 DIAGNOSIS — I10 Essential (primary) hypertension: Secondary | ICD-10-CM

## 2023-11-24 ENCOUNTER — Ambulatory Visit (INDEPENDENT_AMBULATORY_CARE_PROVIDER_SITE_OTHER): Admitting: Neurology

## 2023-11-24 DIAGNOSIS — R488 Other symbolic dysfunctions: Secondary | ICD-10-CM | POA: Diagnosis not present

## 2023-11-24 NOTE — Progress Notes (Signed)
 Ambulatory EEG hooked up and running. Light flashing. Push button tested. Camera and event log explained. Batteries explained. Patient understood.

## 2023-11-27 NOTE — Progress Notes (Signed)
 AMB EEG discontinued.  Skin Breakdown:No Diary Returned: No

## 2023-12-15 ENCOUNTER — Ambulatory Visit: Payer: Self-pay | Admitting: Neurology

## 2023-12-15 NOTE — Procedures (Signed)
 ELECTROENCEPHALOGRAM REPORT  Dates of Recording: 11/24/2023 2:03PM to 11/26/2023 1:03PM  Patient's Name: Brian Matthews MRN: [id number] Date of Birth: 11-23-1950  Referring Provider: Dr. Tonita Blanch  Procedure: 47-hour ambulatory video EEG  History:  This is a 73 year old man with recurrent episodes of difficulty reading/speaking clearly. EEG for classification.   CNS Active Medications: none   Technical Summary: This is a 47-hour multichannel digital video EEG recording measured by the international 10-20 system with electrodes applied with paste and impedances below 5000 ohms performed as portable with EKG monitoring.  The digital EEG was referentially recorded, reformatted, and digitally filtered in a variety of bipolar and referential montages for optimal display.    DESCRIPTION OF RECORDING: During maximal wakefulness, the background activity consisted of a symmetric 99 Hz posterior dominant rhythm which was reactive to eye opening.  There were no epileptiform discharges or focal slowing seen in wakefulness.  During the recording, the patient progresses through wakefulness, drowsiness, and Stage 2 sleep. During drowsiness and sleep, there is an increase in theta slowing of the background, at times sharply contoured over the left temporal region without clear epileptogenic potential. Again, there were no epileptiform discharges seen.  Events: There were 6 push button events that appear accidental. No video available for 5 PB events. BP on 6/11 at 1605 hours shows patient sitting on chair, no clinical changes seen. Electrographically, there were no EEG or EKG changes seen.  There were no electrographic seizures seen.  EKG lead was unremarkable.  IMPRESSION: This 47-hour ambulatory video EEG study is within normal limits.  CLINICAL CORRELATION: A normal EEG does not exclude a clinical diagnosis of epilepsy. Typical events were not reported.  If further clinical questions remain,  inpatient video EEG monitoring may be helpful.   Darice Shivers, M.D.

## 2024-01-03 ENCOUNTER — Emergency Department (HOSPITAL_COMMUNITY)
Admission: EM | Admit: 2024-01-03 | Discharge: 2024-01-03 | Disposition: A | Discharge status: Home / Self Care | Attending: Emergency Medicine | Admitting: Emergency Medicine

## 2024-01-03 ENCOUNTER — Encounter (HOSPITAL_COMMUNITY): Payer: Self-pay | Admitting: *Deleted

## 2024-01-03 ENCOUNTER — Emergency Department (HOSPITAL_COMMUNITY)

## 2024-01-03 ENCOUNTER — Other Ambulatory Visit: Payer: Self-pay

## 2024-01-03 DIAGNOSIS — I10 Essential (primary) hypertension: Secondary | ICD-10-CM | POA: Insufficient documentation

## 2024-01-03 DIAGNOSIS — R5381 Other malaise: Secondary | ICD-10-CM | POA: Insufficient documentation

## 2024-01-03 DIAGNOSIS — R6889 Other general symptoms and signs: Secondary | ICD-10-CM

## 2024-01-03 DIAGNOSIS — K219 Gastro-esophageal reflux disease without esophagitis: Secondary | ICD-10-CM | POA: Diagnosis not present

## 2024-01-03 DIAGNOSIS — R220 Localized swelling, mass and lump, head: Secondary | ICD-10-CM | POA: Insufficient documentation

## 2024-01-03 DIAGNOSIS — Z79899 Other long term (current) drug therapy: Secondary | ICD-10-CM | POA: Diagnosis not present

## 2024-01-03 DIAGNOSIS — R109 Unspecified abdominal pain: Secondary | ICD-10-CM | POA: Diagnosis not present

## 2024-01-03 DIAGNOSIS — E876 Hypokalemia: Secondary | ICD-10-CM | POA: Diagnosis not present

## 2024-01-03 LAB — CBC
HCT: 46.5 % (ref 39.0–52.0)
Hemoglobin: 15.9 g/dL (ref 13.0–17.0)
MCH: 30.6 pg (ref 26.0–34.0)
MCHC: 34.2 g/dL (ref 30.0–36.0)
MCV: 89.4 fL (ref 80.0–100.0)
Platelets: 168 K/uL (ref 150–400)
RBC: 5.2 MIL/uL (ref 4.22–5.81)
RDW: 12.9 % (ref 11.5–15.5)
WBC: 4.6 K/uL (ref 4.0–10.5)
nRBC: 0 % (ref 0.0–0.2)

## 2024-01-03 LAB — URINALYSIS, ROUTINE W REFLEX MICROSCOPIC
Bilirubin Urine: NEGATIVE
Glucose, UA: NEGATIVE mg/dL
Hgb urine dipstick: NEGATIVE
Ketones, ur: NEGATIVE mg/dL
Leukocytes,Ua: NEGATIVE
Nitrite: NEGATIVE
Protein, ur: NEGATIVE mg/dL
Specific Gravity, Urine: 1.018 (ref 1.005–1.030)
pH: 5 (ref 5.0–8.0)

## 2024-01-03 LAB — COMPREHENSIVE METABOLIC PANEL WITH GFR
ALT: 17 U/L (ref 0–44)
AST: 27 U/L (ref 15–41)
Albumin: 3.9 g/dL (ref 3.5–5.0)
Alkaline Phosphatase: 53 U/L (ref 38–126)
Anion gap: 12 (ref 5–15)
BUN: 13 mg/dL (ref 8–23)
CO2: 25 mmol/L (ref 22–32)
Calcium: 9 mg/dL (ref 8.9–10.3)
Chloride: 103 mmol/L (ref 98–111)
Creatinine, Ser: 1.27 mg/dL — ABNORMAL HIGH (ref 0.61–1.24)
GFR, Estimated: >60 mL/min (ref >60)
Glucose, Bld: 112 mg/dL — ABNORMAL HIGH (ref 70–99)
Potassium: 3.3 mmol/L — ABNORMAL LOW (ref 3.5–5.1)
Sodium: 140 mmol/L (ref 135–145)
Total Bilirubin: 0.8 mg/dL (ref 0.0–1.2)
Total Protein: 6.5 g/dL (ref 6.5–8.1)

## 2024-01-03 LAB — LIPASE, BLOOD: Lipase: 48 U/L (ref 11–51)

## 2024-01-03 MED ORDER — POTASSIUM CHLORIDE CRYS ER 20 MEQ PO TBCR
40.0000 meq | EXTENDED_RELEASE_TABLET | Freq: Once | ORAL | Status: AC
  Administered 2024-01-03: 40 meq via ORAL
  Filled 2024-01-03: qty 2

## 2024-01-03 NOTE — ED Triage Notes (Signed)
 Headache higher bp than usual  and  he has felt a pulse beating in his abdomen for 3-4 days  he has had a funny feeling in his legs abd he sometimes has difficulty swallowing   for a longer length of time

## 2024-01-03 NOTE — Discharge Instructions (Addendum)
 Your work up today is reassuring. Your ultrasound does not show an aneurysm. Please follow up with your primary care provider.

## 2024-01-03 NOTE — ED Provider Notes (Signed)
 [place] EMERGENCY DEPARTMENT AT [facility] Provider Note   CSN: [id number] Arrival date & time: 01/03/24  9953     Patient presents with: Abdominal Pain   Ray Petzold is a 73 y.o. male.   73 year old male presents to the emergency room with complaint of a pulselike sensation in his abdomen.  Patient is concerned that he may have an aneurysm.  He does not have abdominal pain but reports intermittent bloating and irregular bowels recently.  He also notes that he has some pain in his head at times and a lump in his throat.       Prior to Admission medications   Medication Sig Start Date End Date Taking? Authorizing Provider  Alpha-Lipoic Acid 200 MG CAPS Take by mouth daily.    [provider]  amLODipine  (NORVASC ) 5 MG tablet TAKE 1 TABLET(5 MG) BY MOUTH DAILY 11/06/23   Wendee Lynwood HERO, NP  Cholecalciferol (VITAMIN D -3) 125 MCG (5000 UT) TABS Take by mouth. 1 tablet 4-5 days per week    [provider]  Collagen-Vitamin C (ULTRA COLLAGEN + C PO) Take by mouth daily.    [provider]  Cranberry 500 MG TABS Take by mouth daily.    [provider]  Misc Natural Products (GLUCOSAMINE CHOND COMPLEX/MSM) TABS Take by mouth daily.    [provider]  Misc Natural Products (MENS PROSTATE HEALTH FORMULA PO) Take 1 tablet by mouth daily.    [provider]  Multiple Vitamin (MULTIVITAMIN) tablet Take 1 tablet by mouth daily.    [provider]  Omega-3 Fatty Acids (FISH OIL) 1200 MG CAPS Take by mouth. 1-2 per day    [provider]  Plant Sterols and Stanols (CHOLESTOFF PO) Take 900 mg by mouth daily.    [provider]  Saw Palmetto 450 MG CAPS Take 2 capsules by mouth daily.    [provider]  triamcinolone  cream (KENALOG ) 0.1 % Apply 1 Application topically 2 (two) times daily. 08/22/23   Levander Houston, MD  triamterene -hydrochlorothiazide (MAXZIDE-25) 37.5-25 MG tablet TAKE 1 TABLET BY  MOUTH DAILY 02/13/23   Wendee Lynwood HERO, NP  Turmeric (QC TUMERIC COMPLEX) 500 MG CAPS Take by mouth daily.    [provider]  vitamin B-12 (CYANOCOBALAMIN ) 500 MCG tablet Take 500 mcg by mouth daily. 1 tablet 4-5 days per week    [provider]    Allergies: Lisinopril and Penicillins    Review of Systems Negative except as per HPI Updated Vital Signs BP 131/81 (BP Location: Right Arm)   Pulse (!) 50   Temp (!) 97.3 F (36.3 C) (Oral)   Resp 15   Ht 6' (1.829 m)   Wt 78.5 kg   SpO2 100%   BMI 23.47 kg/m   Physical Exam Vitals and nursing note reviewed.  Constitutional:      General: He is not in acute distress.    Appearance: He is well-developed. He is not diaphoretic.  HENT:     Head: Normocephalic and atraumatic.  Cardiovascular:     Pulses: Normal pulses.  Pulmonary:     Effort: Pulmonary effort is normal.  Abdominal:     Palpations: Abdomen is soft.     Tenderness: There is no abdominal tenderness. There is no guarding or rebound.  Musculoskeletal:     Right lower leg: No edema.     Left lower leg: No edema.  Skin:    General: Skin is warm and dry.  Neurological:     Mental Status: He is alert and oriented to person, place, and time.  Psychiatric:        Behavior: Behavior normal.     (all labs ordered are listed, but only abnormal results are displayed) Labs Reviewed  COMPREHENSIVE METABOLIC PANEL WITH GFR - Abnormal; Notable for the following components:      Result Value   Potassium 3.3 (*)    Glucose, Bld 112 (*)    Creatinine, Ser 1.27 (*)    All other components within normal limits  LIPASE, BLOOD  CBC  URINALYSIS, ROUTINE W REFLEX MICROSCOPIC    EKG: None  Radiology: US  Aorta Result Date: 01/03/2024 CLINICAL DATA:  Pulsatile sensation in abdomen. Patient worried about aneurysm. EXAM: ULTRASOUND OF ABDOMINAL AORTA TECHNIQUE: Ultrasound examination of the abdominal aorta and proximal common iliac arteries was performed to  evaluate for aneurysm. Additional color and Doppler images of the distal aorta were obtained to document patency. COMPARISON:  None Available. FINDINGS: Abdominal aortic measurements as follows: Proximal:  2.4 x 2.5 cm Mid:  2.1 x 2.1 cm Distal:  1.8 x 1.8 cm Patent: Yes, peak systolic velocity is 38.9 cm/s Right common iliac artery: 1.6 x 1.5 cm Left common iliac artery: 1.5 x 1.3 cm IMPRESSION: No evidence for abdominal aortic aneurysm. Electronically Signed   By: Waddell Calk M.D.   On: 01/03/2024 05:55     Procedures   Medications Ordered in the ED  potassium chloride  SA (KLOR-CON  M) CR tablet 40 mEq (40 mEq Oral Given 01/03/24 0330)                                    Medical Decision Making Amount and/or Complexity of Data Reviewed Labs: ordered. Radiology: ordered.  Risk Prescription drug management.   This patient presents to the ED for concern of pulse sensation in abdomen, concerned he may have an aneurysm as well as headache, feeling poorly, this involves an extensive number of treatment options, and is a complaint that carries with it a high risk of complications and morbidity.  The differential diagnosis includes but not limited to metabolic electrolyte abnormality, aneurysm   Co morbidities / Chronic conditions that complicate the patient evaluation  Hypertension, GERD, suppurative thyroiditis   Additional history obtained:  Additional history obtained from EMR External records from outside source obtained and reviewed including prior labs and imaging on file   Lab Tests:  I Ordered, and personally interpreted labs.  The pertinent results include: CBC within normal notes.  CMP with mild hypokalemia at 3.3.  Creatinine 1.27, not significantly changed from prior.  Lipase normal.  Urinalysis is normal.   Imaging Studies ordered:  I ordered imaging studies including ultrasound aorta I independently visualized and interpreted imaging which showed negative for  aneurysm  I agree with the radiologist interpretation    Problem List / ED Course / Critical interventions / Medication management  73 year old male presents with multiple complaints, concerns that he has a aneurysm in his abdomen as he has a pulsatile sensation.  Abdomen exam is benign, I do not appreciate any pulsatile areas.  Labs largely reassuring.  Ultrasound of the aorta is obtained and is negative for aneurysm.  Patient is advised to follow-up with his primary care provider. I have reviewed the patients home medicines and have made adjustments as needed  Social Determinants of Health:  Has PCP   Test /  Admission - Considered:  Stable for discharge      Final diagnoses:  Feeling poorly    ED Discharge Orders     None          Beverley Leita LABOR, PA-C 01/03/24 [id number]    Jerral Meth, MD 01/03/24 [phone]

## 2024-01-22 ENCOUNTER — Encounter: Payer: Self-pay | Admitting: Neurology

## 2024-01-22 ENCOUNTER — Encounter: Payer: Self-pay | Admitting: Nurse Practitioner

## 2024-02-02 ENCOUNTER — Other Ambulatory Visit: Payer: Self-pay | Admitting: Nurse Practitioner

## 2024-02-02 DIAGNOSIS — I10 Essential (primary) hypertension: Secondary | ICD-10-CM

## 2024-02-18 ENCOUNTER — Ambulatory Visit: Payer: Self-pay

## 2024-02-18 NOTE — Telephone Encounter (Signed)
 Noted. Will evaluate in office

## 2024-02-18 NOTE — Telephone Encounter (Signed)
 FYI Only or Action Required?: FYI only for provider.  Patient was last seen in primary care on 04/03/2023 by Wendee Lynwood HERO, NP.  Called Nurse Triage reporting Insomnia, Chest Pain, and Generalized Body Aches.  Symptoms began several months ago.  Interventions attempted: Nothing.  Symptoms are: muscle/body aches in under arms/lateral sides of chest, arms, ankles, toes intermittent; insomnia,; headaches stable.  Triage Disposition: See Physician Within 24 Hours  Patient/caregiver understands and will follow disposition?: No            Copied from CRM #[id number]. Topic: Clinical - Red Word Triage >> Feb 18, 2024 10:21 AM Jayma L wrote: Red Word that prompted transfer to Nurse Triage:   headaches at night and can't sleep , insomnia , and some muscle aches around chest with discomfort , aches in fingers and toes. Reason for Disposition  [1] Chest pain lasts > 5 minutes AND [2] occurred > 3 days ago (72 hours) AND [3] NO chest pain or cardiac symptoms now  Answer Assessment - Initial Assessment Questions Patient states his intent of the call was just to reschedule his appt to this week as he plans to be out of town next week. Patient states he does not feel he needs to repeat his symptoms to RN and would prefer we look at his mychart note with his symptoms.  1. LOCATION: Where does it hurt?       States on the sides, area under his arms.   2. RADIATION: Does the pain go anywhere else? (e.g., into neck, jaw, arms, back)     Also in arms, ankles, toes.  3. ONSET: When did the chest pain begin? (Minutes, hours or days)      Several months ago.  4. PATTERN: Does the pain come and go, or has it been constant since it started?  Does it get worse with exertion?      Comes and goes. Describes it occurring randomly, sometimes at rest and sometimes with exercise.  5. DURATION: How long does it last (e.g., seconds, minutes, hours)     Sometimes 15-20 minutes, sometimes an  hour.  6. SEVERITY: How bad is the pain?  (e.g., Scale 1-10; mild, moderate, or severe)     Achy, muscle like pain. He states he has no aches or discomfort in his chest or headaches right now.  7. CARDIAC RISK FACTORS: Do you have any history of heart problems or risk factors for heart disease? (e.g., angina, prior heart attack; diabetes, high blood pressure, high cholesterol, smoker, or strong family history of heart disease)    HTN.  8. PULMONARY RISK FACTORS: Do you have any history of lung disease?  (e.g., blood clots in lung, asthma, emphysema, birth control pills)     No.  9. CAUSE: What do you think is causing the chest pain?     He is concerned it may be long term COVID.  10. OTHER SYMPTOMS: Do you have any other symptoms? (e.g., dizziness, nausea, vomiting, sweating, fever, difficulty breathing, cough)       Headaches (were becoming more frequent and keeping him from sleeping), insomnia, body aches/muscle aches. Denies SOB, unilateral numbness or weakness, changes in speech or vision.  11. PREGNANCY: Is there any chance you are pregnant? When was your last menstrual period?       N/A.  Protocols used: Chest Pain-A-AH

## 2024-02-23 ENCOUNTER — Ambulatory Visit: Admitting: Nurse Practitioner

## 2024-02-23 VITALS — BP 136/80 | HR 55 | Temp 97.8°F | Ht 72.0 in | Wt 168.6 lb

## 2024-02-23 DIAGNOSIS — Z125 Encounter for screening for malignant neoplasm of prostate: Secondary | ICD-10-CM

## 2024-02-23 DIAGNOSIS — R0789 Other chest pain: Secondary | ICD-10-CM | POA: Diagnosis not present

## 2024-02-23 DIAGNOSIS — R519 Headache, unspecified: Secondary | ICD-10-CM

## 2024-02-23 DIAGNOSIS — M791 Myalgia, unspecified site: Secondary | ICD-10-CM

## 2024-02-23 DIAGNOSIS — R194 Change in bowel habit: Secondary | ICD-10-CM

## 2024-02-23 LAB — CBC
HCT: 47.8 % (ref 39.0–52.0)
Hemoglobin: 16 g/dL (ref 13.0–17.0)
MCHC: 33.4 g/dL (ref 30.0–36.0)
MCV: 90.6 fl (ref 78.0–100.0)
Platelets: 176 K/uL (ref 150.0–400.0)
RBC: 5.28 Mil/uL (ref 4.22–5.81)
RDW: 13.7 % (ref 11.5–15.5)
WBC: 3.5 K/uL — ABNORMAL LOW (ref 4.0–10.5)

## 2024-02-23 LAB — COMPREHENSIVE METABOLIC PANEL WITH GFR
ALT: 14 U/L (ref 0–53)
AST: 16 U/L (ref 0–37)
Albumin: 4.3 g/dL (ref 3.5–5.2)
Alkaline Phosphatase: 48 U/L (ref 39–117)
BUN: 12 mg/dL (ref 6–23)
CO2: 31 meq/L (ref 19–32)
Calcium: 9.5 mg/dL (ref 8.4–10.5)
Chloride: 102 meq/L (ref 96–112)
Creatinine, Ser: 1.25 mg/dL (ref 0.40–1.50)
GFR: 57.64 mL/min — ABNORMAL LOW (ref >60.00)
Glucose, Bld: 91 mg/dL (ref 70–99)
Potassium: 4.3 meq/L (ref 3.5–5.1)
Sodium: 140 meq/L (ref 135–145)
Total Bilirubin: 0.7 mg/dL (ref 0.2–1.2)
Total Protein: 6.7 g/dL (ref 6.0–8.3)

## 2024-02-23 LAB — SEDIMENTATION RATE: Sed Rate: 4 mm/h (ref 0–20)

## 2024-02-23 LAB — TSH: TSH: 2.08 u[IU]/mL (ref 0.35–5.50)

## 2024-02-23 LAB — PSA, MEDICARE: PSA: 0.52 ng/mL (ref 0.10–4.00)

## 2024-02-23 LAB — HIGH SENSITIVITY CRP: CRP, High Sensitivity: 1.18 mg/L (ref 0.000–5.000)

## 2024-02-23 LAB — CK: Total CK: 85 U/L (ref 17–232)

## 2024-02-23 NOTE — Patient Instructions (Signed)
 Nice to see you today I will be in touch with the labs once I have them Follow up with me as needed

## 2024-02-23 NOTE — Progress Notes (Signed)
 Acute Office Visit  Subjective:     Patient ID: Brian Matthews, male    DOB: Aug 15, 1950, 73 y.o.   MRN: [id number]  Chief Complaint  Patient presents with   symptoms of long COVID    Pt complains of low intensity headaches that's causing insomnia, muscle aches, gastro problems. (4 to 5 BM a day). Pt states symptoms will stop for a few days then start again.. Pt complains of aching on sides of chest. Symptoms started in June.     HPI   Discussed the use of AI scribe software for clinical note transcription with the patient, who gave verbal consent to proceed.  History of Present Illness Brian Matthews is a 73 year old male who presents with concerns about long COVID symptoms.  Since mid-June 2025, he has experienced intermittent symptoms and has wondered if they could be related to long COVID. Initially intermittent and mild, these symptoms have persisted, prompting medical consultation.  His primary concern is low-intensity headaches occurring predominantly at night, lasting six to eight hours, and significantly disrupting his sleep, limiting him to one or two hours of rest on some nights. He describes these headaches as 'dull aching' with occasional 'quick little pain' but not like regular headaches he has experienced before. Over-the-counter medications, including Tylenol  and ibuprofen, have provided minimal relief. He avoids excessive use of these medications due to past gastrointestinal issues from NSAIDs. No aura, light sensitivity, sound sensitivity, nausea, vomiting, weakness, numbness, or tingling associated with headaches.  He reports bilateral toe and ankle aches, describing them as muscle aches rather than joint pain, unrelated to physical activity. Additionally, he experiences aching on the sides of his chest, which he describes as chest wall pain rather than deeper chest pain.  Gastrointestinal symptoms have included increased frequency of bowel movements, up to  four or five times a day, without changes in stool consistency. These symptoms have been intermittent and are currently in remission. No abdominal pain with increased bowel movements.  His symptoms seem to improve with cooler weather. He has a history of extensive imaging, including multiple MRIs and a CT scan, which revealed a stable vascular malformation. He has also undergone cardiac evaluations, including an EKG and wearing a heart monitor, which did not reveal significant findings.  He maintains a high level of physical activity, engaging in a routine of 65 to 72 minutes of exercise four to five times a week, including light weights, aerobics, and walking/running.  Review of Systems  Constitutional:  Negative for chills and fever.  Respiratory:  Negative for shortness of breath.   Cardiovascular:  Positive for chest pain.  Gastrointestinal:  Negative for blood in stool, constipation, nausea and vomiting.  Musculoskeletal:  Positive for myalgias.  Neurological:  Positive for headaches. Negative for dizziness, tingling and weakness.  Psychiatric/Behavioral:  Negative for hallucinations and suicidal ideas. The patient has insomnia.         Objective:    BP 136/80   Pulse (!) 55   Temp 97.8 F (36.6 C) (Oral)   Ht 6' (1.829 m)   Wt 168 lb 9.6 oz (76.5 kg)   SpO2 99%   BMI 22.87 kg/m  BP Readings from Last 3 Encounters:  02/23/24 136/80  01/03/24 128/77  08/22/23 (!) 145/80   Wt Readings from Last 3 Encounters:  02/23/24 168 lb 9.6 oz (76.5 kg)  01/03/24 173 lb 1 oz (78.5 kg)  08/22/23 173 lb 1 oz (78.5 kg)   SpO2 Readings from Last  3 Encounters:  02/23/24 99%  01/03/24 100%  08/22/23 100%      Physical Exam Vitals and nursing note reviewed. Exam conducted with a chaperone present.  Constitutional:      Appearance: Normal appearance.  HENT:     Right Ear: Tympanic membrane, ear canal and external ear normal.     Left Ear: Tympanic membrane, ear canal and external  ear normal.     Mouth/Throat:     Mouth: Mucous membranes are moist.     Pharynx: Oropharynx is clear.  Eyes:     Pupils: Pupils are equal, round, and reactive to light.  Cardiovascular:     Rate and Rhythm: Normal rate and regular rhythm.     Heart sounds: Normal heart sounds.  Pulmonary:     Effort: Pulmonary effort is normal.     Breath sounds: Normal breath sounds.  Chest:     Chest wall: No tenderness.  Abdominal:     General: Bowel sounds are normal. There is no distension.     Palpations: There is no mass.     Tenderness: There is no abdominal tenderness.     Hernia: No hernia is present.  Lymphadenopathy:     Cervical: No cervical adenopathy.  Neurological:     General: No focal deficit present.     Mental Status: He is alert.     Deep Tendon Reflexes:     Reflex Scores:      Bicep reflexes are 2+ on the right side and 2+ on the left side.      Patellar reflexes are 2+ on the right side and 2+ on the left side.    Comments: Bilateral upper and lower extremity strength 5/5     No results found for any visits on 02/23/24.      Assessment & Plan:   Problem List Items Addressed This Visit       Other   Myalgia   Relevant Orders   CBC   Comprehensive metabolic panel with GFR   CK   Sedimentation rate   High sensitivity CRP   Other Visit Diagnoses       Frequent headaches    -  Primary   Relevant Orders   CBC   Comprehensive metabolic panel with GFR   Sedimentation rate   High sensitivity CRP   TSH     Chest wall pain       Relevant Orders   CBC   Comprehensive metabolic panel with GFR   Sedimentation rate   High sensitivity CRP   TSH     Change in bowel habits       Relevant Orders   TSH     Screening for prostate cancer       Relevant Orders   PSA, Medicare      Assessment and Plan Assessment & Plan Cognitive and behavioral changes Cognitive and behavioral changes noted as one of the primary conditions managed during the last  encounter. Focus on symptoms potentially related to long COVID.  Headache and head pain Intermittent nocturnal dull aching head pain affecting sleep. No aura, photophobia, phonophobia, nausea, or vomiting. Minimal relief from acetaminophen  and ibuprofen. Previous imaging showed vascular malformation with no significant findings. Possible relation to long COVID, improved in cooler weather. - Order blood work: electrolytes, CK, ESR, CRP. - Consider further evaluation if headaches persist or worsen. Patient does have a neurologist  Muscle aches of toes and ankles Intermittent bilateral symmetrical muscle aches in toes  and ankles, described as muscle pain. Potentially related to long COVID or diuretic-induced electrolyte imbalance. - Order blood work: electrolytes, CK, ESR, CRP.  Chest wall pain Intermittent aching on sides of chest, described as chest wall pain. Symptoms absent currently, unlikely cardiac. Improved with change in sleeping position. - Consider further evaluation if chest wall pain persists or worsens.  Change in bowel habits Intermittent increase in bowel movements, normal consistency, no pain. Potentially related to long COVID or other gastrointestinal factors. Normal colonoscopy in 2023. - Consider referral to GI if changes persist or worsen.  No orders of the defined types were placed in this encounter.   Return if symptoms worsen or fail to improve.  Adina Crandall, NP

## 2024-02-25 ENCOUNTER — Ambulatory Visit: Payer: Self-pay | Admitting: Nurse Practitioner

## 2024-03-16 ENCOUNTER — Ambulatory Visit (INDEPENDENT_AMBULATORY_CARE_PROVIDER_SITE_OTHER)

## 2024-03-16 ENCOUNTER — Encounter: Payer: Self-pay | Admitting: Nurse Practitioner

## 2024-03-16 DIAGNOSIS — Z23 Encounter for immunization: Secondary | ICD-10-CM

## 2024-03-18 NOTE — Telephone Encounter (Signed)
 Hello all,   We collectively see this patient. He has sent me a message with symptoms that touch both disciplines.  I saw him in office. All the lab work I have done is normal. Just making sure you guys do not have any other ideas.   Thanks for you time  Adina BROCKS

## 2024-03-19 ENCOUNTER — Emergency Department (HOSPITAL_COMMUNITY)

## 2024-03-19 ENCOUNTER — Emergency Department (HOSPITAL_COMMUNITY)
Admission: EM | Admit: 2024-03-19 | Discharge: 2024-03-19 | Disposition: A | Discharge status: Home / Self Care | Attending: Emergency Medicine | Admitting: Emergency Medicine

## 2024-03-19 ENCOUNTER — Other Ambulatory Visit: Payer: Self-pay

## 2024-03-19 ENCOUNTER — Encounter (HOSPITAL_COMMUNITY): Payer: Self-pay

## 2024-03-19 DIAGNOSIS — R519 Headache, unspecified: Secondary | ICD-10-CM | POA: Insufficient documentation

## 2024-03-19 DIAGNOSIS — D72819 Decreased white blood cell count, unspecified: Secondary | ICD-10-CM | POA: Diagnosis not present

## 2024-03-19 DIAGNOSIS — R0781 Pleurodynia: Secondary | ICD-10-CM | POA: Diagnosis not present

## 2024-03-19 DIAGNOSIS — R52 Pain, unspecified: Secondary | ICD-10-CM

## 2024-03-19 DIAGNOSIS — D696 Thrombocytopenia, unspecified: Secondary | ICD-10-CM | POA: Insufficient documentation

## 2024-03-19 DIAGNOSIS — G8929 Other chronic pain: Secondary | ICD-10-CM

## 2024-03-19 DIAGNOSIS — E871 Hypo-osmolality and hyponatremia: Secondary | ICD-10-CM | POA: Insufficient documentation

## 2024-03-19 DIAGNOSIS — R509 Fever, unspecified: Secondary | ICD-10-CM | POA: Diagnosis not present

## 2024-03-19 DIAGNOSIS — R059 Cough, unspecified: Secondary | ICD-10-CM | POA: Insufficient documentation

## 2024-03-19 DIAGNOSIS — M791 Myalgia, unspecified site: Secondary | ICD-10-CM | POA: Diagnosis not present

## 2024-03-19 LAB — CBC WITH DIFFERENTIAL/PLATELET
Basophils Absolute: 0 K/uL (ref 0.0–0.1)
Basophils Relative: 0 %
Eosinophils Absolute: 0 K/uL (ref 0.0–0.5)
Eosinophils Relative: 0 %
HCT: 45.6 % (ref 39.0–52.0)
Hemoglobin: 15.9 g/dL (ref 13.0–17.0)
Lymphocytes Relative: 30 %
Lymphs Abs: 0.7 K/uL (ref 0.7–4.0)
MCH: 30.3 pg (ref 26.0–34.0)
MCHC: 34.9 g/dL (ref 30.0–36.0)
MCV: 87 fL (ref 80.0–100.0)
Monocytes Absolute: 0.2 K/uL (ref 0.1–1.0)
Monocytes Relative: 10 %
Neutro Abs: 1.3 K/uL — ABNORMAL LOW (ref 1.7–7.7)
Neutrophils Relative %: 60 %
Platelets: 143 K/uL — ABNORMAL LOW (ref 150–400)
RBC: 5.24 MIL/uL (ref 4.22–5.81)
RDW: 12.6 % (ref 11.5–15.5)
WBC: 2.2 K/uL — ABNORMAL LOW (ref 4.0–10.5)
nRBC: 0 % (ref 0.0–0.2)

## 2024-03-19 LAB — COMPREHENSIVE METABOLIC PANEL WITH GFR
ALT: 36 U/L (ref 0–44)
AST: 54 U/L — ABNORMAL HIGH (ref 15–41)
Albumin: 3.5 g/dL (ref 3.5–5.0)
Alkaline Phosphatase: 45 U/L (ref 38–126)
Anion gap: 11 (ref 5–15)
BUN: 13 mg/dL (ref 8–23)
CO2: 25 mmol/L (ref 22–32)
Calcium: 8.9 mg/dL (ref 8.9–10.3)
Chloride: 98 mmol/L (ref 98–111)
Creatinine, Ser: 0.5 mg/dL — ABNORMAL LOW (ref 0.61–1.24)
GFR, Estimated: >60 mL/min (ref >60)
Glucose, Bld: 96 mg/dL (ref 70–99)
Potassium: 4 mmol/L (ref 3.5–5.1)
Sodium: 134 mmol/L — ABNORMAL LOW (ref 135–145)
Total Bilirubin: 1 mg/dL (ref 0.0–1.2)
Total Protein: 6.4 g/dL — ABNORMAL LOW (ref 6.5–8.1)

## 2024-03-19 LAB — RESP PANEL BY RT-PCR (RSV, FLU A&B, COVID)  RVPGX2
Influenza A by PCR: NEGATIVE
Influenza B by PCR: NEGATIVE
Resp Syncytial Virus by PCR: NEGATIVE
SARS Coronavirus 2 by RT PCR: NEGATIVE

## 2024-03-19 IMAGING — MR MR HEAD W/O CM
12 of 13 series · 44 of 48 positions shown · non-contrast
Comparison: 12/25/2020

CLINICAL DATA: Tingling feeling on left side of body beginning
suddenly

EXAM:
MRI HEAD WITHOUT CONTRAST
TECHNIQUE: Multiplanar, multiecho pulse sequences of the brain and surrounding
structures were obtained without intravenous contrast.

[Series 5: DWI · axial · 3.0mm · 0.92mm/px · z∈[-110,+51]mm · 8 of 111 slices shown (1 of 4)]
[im 1/111]
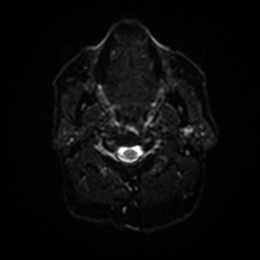
[im 16/111]
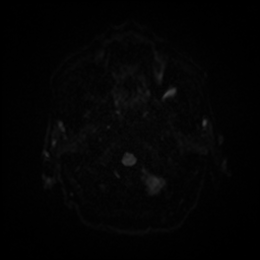
[im 32/111]
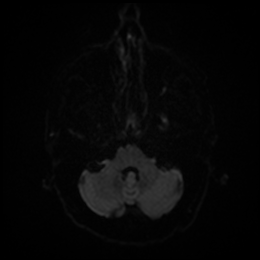
[im 48/111]
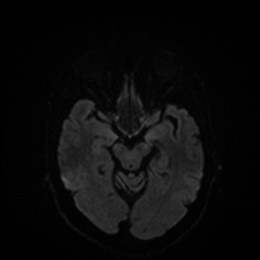
[im 63/111]
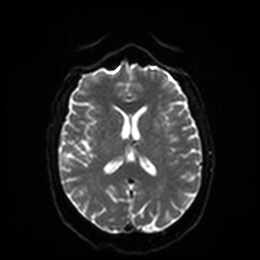
[im 79/111]
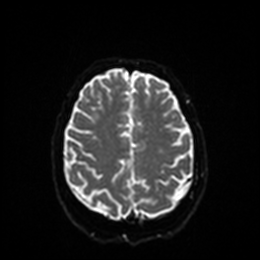
[im 95/111]
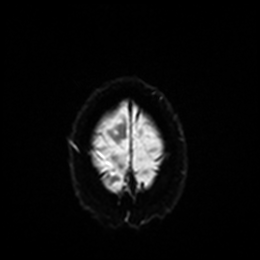
[im 111/111]
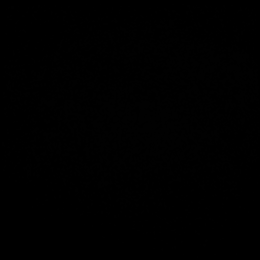

[Series 6: DWI · axial · 3.0mm · 0.92mm/px · z∈[-110,+48]mm · 4 of 54 slices shown (2 of 4)]
[im 1/54]
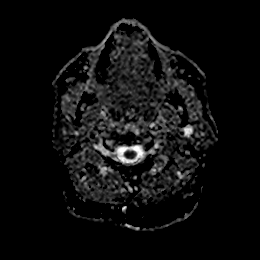
[im 18/54]
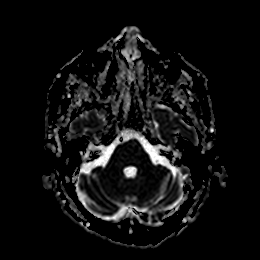
[im 36/54]
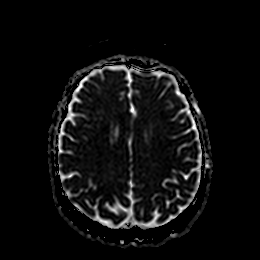
[im 54/54]
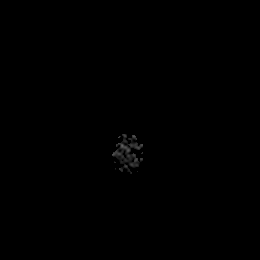

[Series 7: DWI · coronal · 4.0mm · 0.88mm/px · 6 of 80 slices shown (3 of 4)]
[im 1/80]
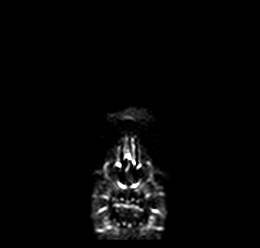
[im 16/80]
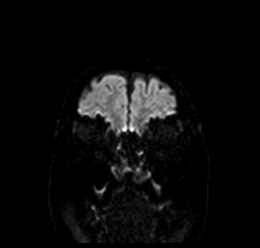
[im 32/80]
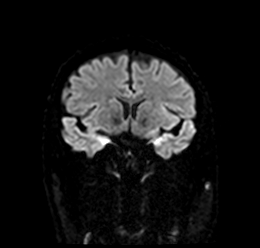
[im 48/80]
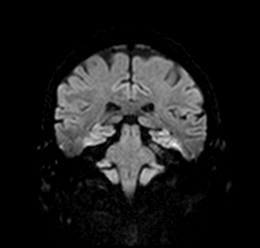
[im 64/80]
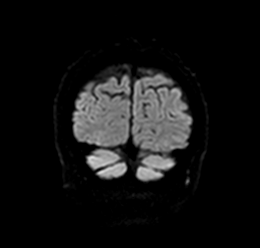
[im 80/80]
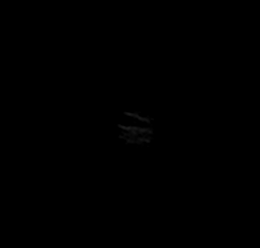

[Series 8: DWI · coronal · 4.0mm · 0.88mm/px · 3 of 40 slices shown (4 of 4)]
[im 1/40]
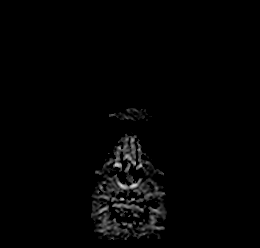
[im 20/40]
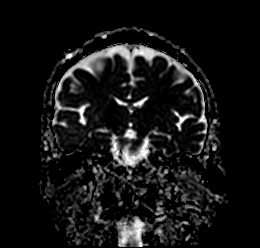
[im 40/40]
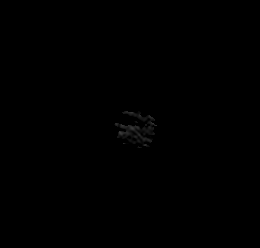

[Series 9: T1 · sagittal · 5.0mm · 0.78mm/px · 2 of 28 slices shown]
[im 1/28]
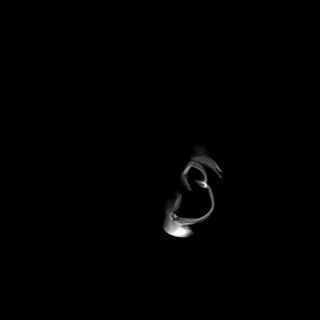
[im 28/28]
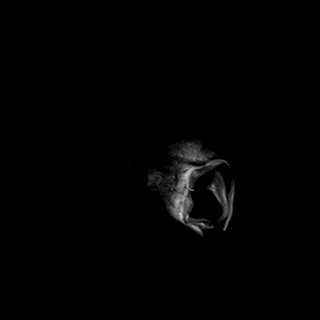

[Series 10: T2 · axial · 5.0mm · 0.78mm/px · z∈[-109,+48]mm · 2 of 28 slices shown (1 of 2)]
[im 1/28]
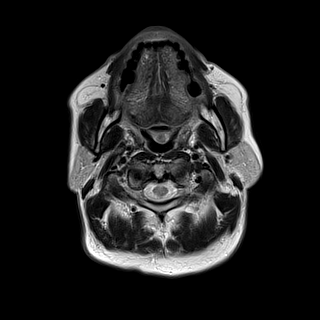
[im 28/28]
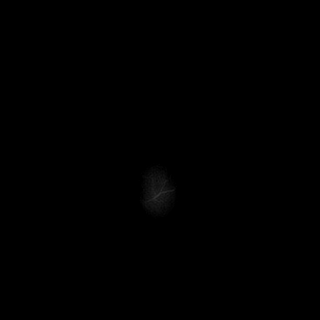

[Series 11: FLAIR · axial · 5.0mm · 0.47mm/px · z∈[-108,+49]mm · 2 of 28 slices shown]
[im 1/28]
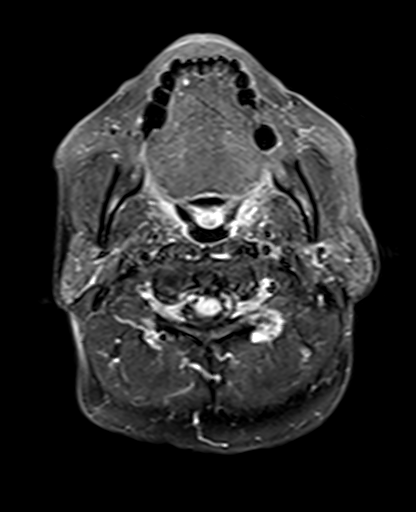
[im 28/28]
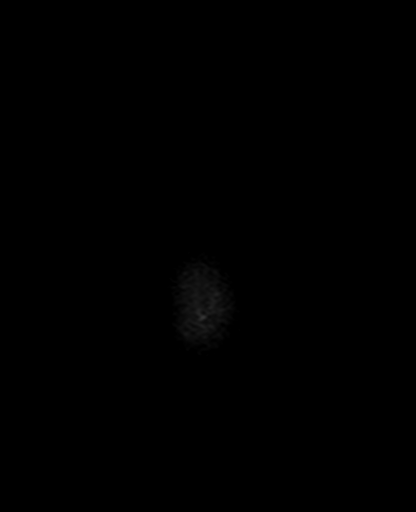

[Series 12: mag_images · axial · 3.0mm · 0.94mm/px · z∈[-110,+51]mm · 4 of 56 slices shown]
[im 1/56]
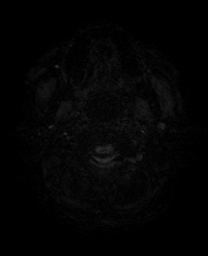
[im 19/56]
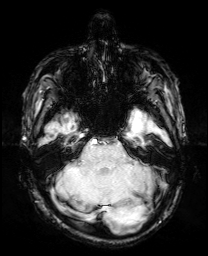
[im 37/56]
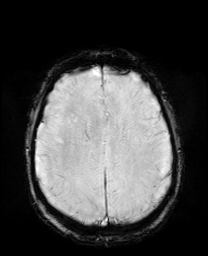
[im 56/56]
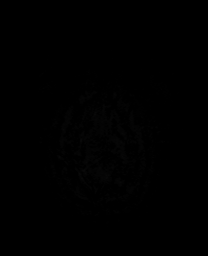

[Series 13: pha_images · axial · 3.0mm · 0.94mm/px · z∈[-110,+51]mm · 4 of 56 slices shown]
[im 1/56]
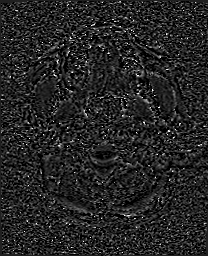
[im 19/56]
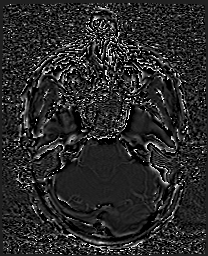
[im 37/56]
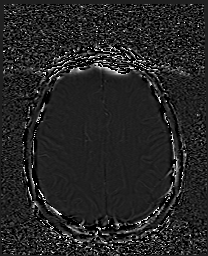
[im 56/56]
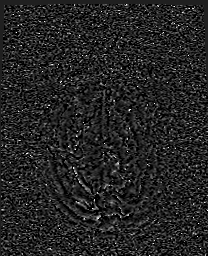

[Series 14: swi_images · axial · 3.0mm · 0.94mm/px · z∈[-110,+51]mm · 4 of 56 slices shown]
[im 1/56]
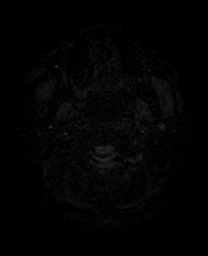
[im 19/56]
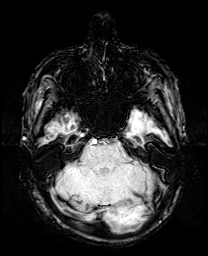
[im 37/56]
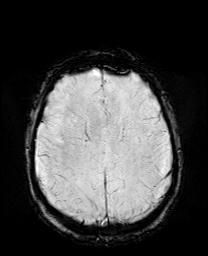
[im 56/56]
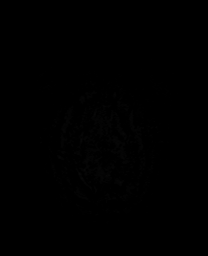

[Series 15: mip_images(sw) · axial · 24.0mm · 0.94mm/px · z∈[-99,+41]mm · 3 of 49 slices shown]
[im 1/49]
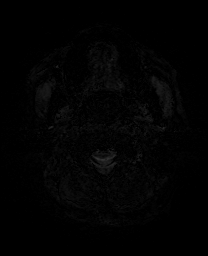
[im 25/49]
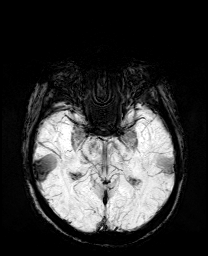
[im 49/49]
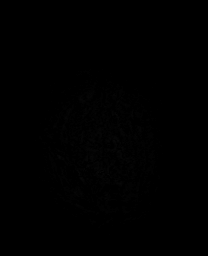

[Series 17: T2 · coronal · 5.0mm · 0.34mm/px · 2 of 33 slices shown (2 of 2)]
[im 1/33]
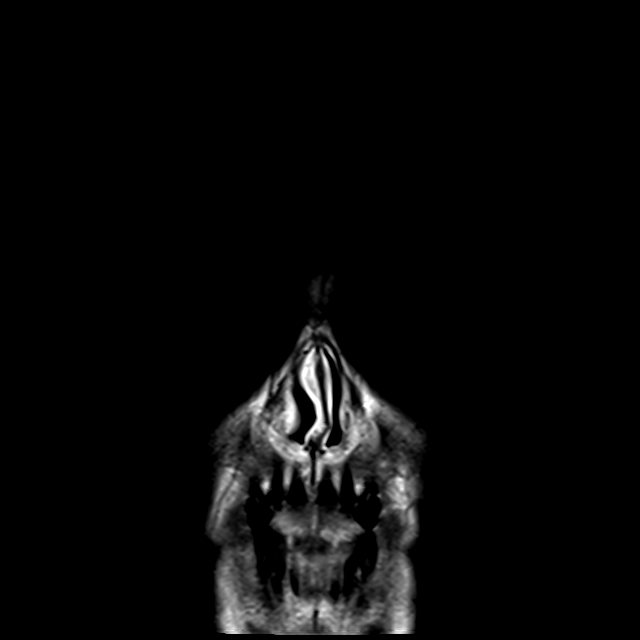
[im 33/33]
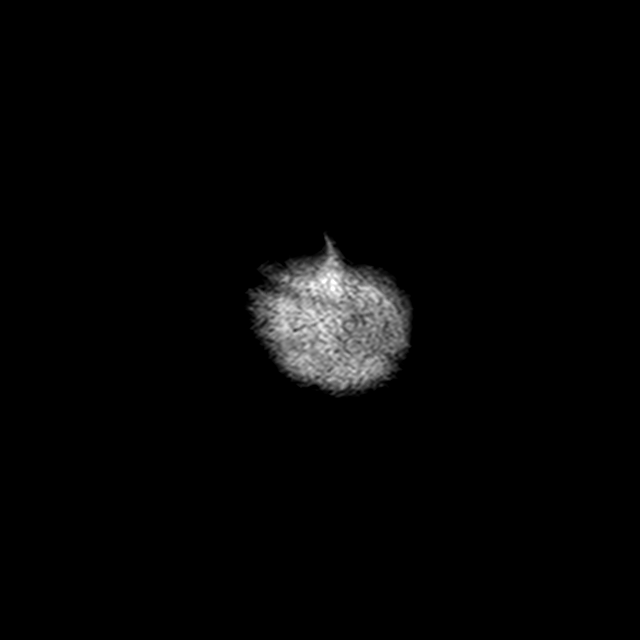

[44 of 48 positions shown; findings below may reference images not displayed]

FINDINGS: Brain: No acute infarction, hemorrhage, hydrocephalus, extra-axial
collection or mass lesion. Few for age small FLAIR hyperintensities
scattered in the cerebral white matter, likely early small vessel
ischemic change given age and history of hypertension. Brain volume
is normal. No specific demyelinating pattern.

Vascular: Normal flow voids.

Skull and upper cervical spine: Normal marrow signal.

Sinuses/Orbits: Negative.
IMPRESSION: No acute finding or explanation for symptoms.

## 2024-03-19 MED ORDER — PROCHLORPERAZINE MALEATE 10 MG PO TABS: 10.0000 mg | ORAL_TABLET | Freq: Two times a day (BID) | ORAL | 0 refills | Status: DC | PRN

## 2024-03-19 NOTE — ED Triage Notes (Signed)
 Pt c.o generalized body pains, cough, increased Bm occurrences since Mid June. Pt has been in communication with his PCP for potential Long COVID. Pt had lab work and other tests done that were all normal.

## 2024-03-19 NOTE — ED Provider Notes (Signed)
 [place] [facility] DEPARTMENT AT [facility] Provider Note   CSN: [id number] Arrival date & time: 03/19/24  1306     Patient presents with: Generalized Body Aches   Brian Matthews is a 73 y.o. male.   HPI 73 yo male presents today complaining of headache, rib cage pain, some increased frequency of bowel movements per day.  Denies watery stools.  Reports that his doctor tested him for covid, This weekend states the symptoms are worse with some worsening headache, body aches, fever loc 101.  Slight cough, no urinary symptoms.  Some sore throat for since other symptoms started in June.     Prior to Admission medications   Medication Sig Start Date End Date Taking? Authorizing Provider  prochlorperazine  (COMPAZINE ) 10 MG tablet Take 1 tablet (10 mg total) by mouth 2 (two) times daily as needed (headache). 03/19/24  Yes Levander Houston, MD  Alpha-Lipoic Acid 200 MG CAPS Take by mouth daily.    [provider]  amLODipine  (NORVASC ) 5 MG tablet TAKE 1 TABLET(5 MG) BY MOUTH DAILY 11/06/23   Wendee Lynwood HERO, NP  Cholecalciferol (VITAMIN D -3) 125 MCG (5000 UT) TABS Take by mouth. 1 tablet 4-5 days per week    [provider]  Collagen-Vitamin C (ULTRA COLLAGEN + C PO) Take by mouth daily.    [provider]  Cranberry 500 MG TABS Take by mouth daily.    [provider]  Misc Natural Products (GLUCOSAMINE CHOND COMPLEX/MSM) TABS Take by mouth daily.    [provider]  Misc Natural Products (MENS PROSTATE HEALTH FORMULA PO) Take 1 tablet by mouth daily.    [provider]  Multiple Vitamin (MULTIVITAMIN) tablet Take 1 tablet by mouth daily.    [provider]  Omega-3 Fatty Acids (FISH OIL) 1200 MG CAPS Take by mouth. 1-2 per day    [provider]  Plant Sterols and Stanols (CHOLESTOFF PO) Take 900 mg by mouth daily.    [provider]  Saw Palmetto 450 MG CAPS Take 2 capsules by mouth daily.    [provider]  triamcinolone  cream (KENALOG ) 0.1 % Apply 1 Application topically 2 (two) times daily. 08/22/23   Levander Houston, MD  triamterene -hydrochlorothiazide (MAXZIDE-25) 37.5-25 MG tablet TAKE 1 TABLET BY MOUTH DAILY 02/02/24   Webb, Padonda B, FNP  Turmeric (QC TUMERIC COMPLEX) 500 MG CAPS Take by mouth daily.    [provider]  vitamin B-12 (CYANOCOBALAMIN ) 500 MCG tablet Take 500 mcg by mouth daily. 1 tablet 4-5 days per week    [provider]    Allergies: Lisinopril and Penicillins    Review of Systems  Updated Vital Signs BP 124/79   Pulse 79   Temp 98.4 F (36.9 C)   Resp 14   Ht 1.829 m (6')   Wt 75.3 kg   SpO2 99%   BMI 22.51 kg/m   Physical Exam Vitals and nursing note reviewed.  HENT:     Head: Normocephalic.     Right Ear: External ear normal.     Left Ear: External ear normal.     Nose: Nose normal.     Mouth/Throat:     Pharynx: Oropharynx is clear.  Eyes:     Extraocular Movements: Extraocular movements intact.     Pupils: Pupils are equal, round, and reactive to light.  Cardiovascular:     Rate and Rhythm: Normal rate and regular rhythm.     Pulses: Normal pulses.  Pulmonary:  Effort: Pulmonary effort is normal.  Abdominal:     General: Abdomen is flat. Bowel sounds are normal.     Palpations: Abdomen is soft.  Musculoskeletal:        General: Normal range of motion.     Cervical back: Normal range of motion.  Skin:    General: Skin is warm and dry.     Capillary Refill: Capillary refill takes less than 2 seconds.  Neurological:     General: No focal deficit present.     Mental Status: He is alert.  Psychiatric:        Mood and Affect: Mood normal.     (all labs ordered are listed, but only abnormal results are displayed) Labs Reviewed  CBC WITH DIFFERENTIAL/PLATELET - Abnormal; Notable for the following components:      Result Value   WBC 2.2 (*)    Platelets 143 (*)    Neutro Abs 1.3 (*)    All other  components within normal limits  COMPREHENSIVE METABOLIC PANEL WITH GFR - Abnormal; Notable for the following components:   Sodium 134 (*)    Creatinine, Ser 0.50 (*)    Total Protein 6.4 (*)    AST 54 (*)    All other components within normal limits  RESP PANEL BY RT-PCR (RSV, FLU A&B, COVID)  RVPGX2  URINALYSIS, ROUTINE W REFLEX MICROSCOPIC    EKG: EKG Interpretation Date/Time:  Saturday March 19 2024 14:20:33 EDT Ventricular Rate:  62 PR Interval:  159 QRS Duration:  92 QT Interval:  390 QTC Calculation: 396 R Axis:   41  Text Interpretation: Sinus rhythm No significant change since last tracing 12/15/2022 Confirmed by Levander Houston [phone]) on 03/19/2024 4:39:53 PM  Radiology: CT Head Wo Contrast Result Date: 03/19/2024 CLINICAL DATA:  Headache. EXAM: CT HEAD WITHOUT CONTRAST TECHNIQUE: Contiguous axial images were obtained from the base of the skull through the vertex without intravenous contrast. RADIATION DOSE REDUCTION: This exam was performed according to the departmental dose-optimization program which includes automated exposure control, adjustment of the mA and/or kV according to patient size and/or use of iterative reconstruction technique. COMPARISON:  December 25, 2020 FINDINGS: Brain: No evidence of acute infarction, hemorrhage, hydrocephalus, extra-axial collection or mass lesion/mass effect. Vascular: No hyperdense vessel or unexpected calcification. Skull: Normal. Negative for fracture or focal lesion. Sinuses/Orbits: No acute finding. Other: None. IMPRESSION: No acute intracranial pathology. Electronically Signed   By: Suzen Dials M.D.   On: 03/19/2024 16:03   DG Chest Port 1 View Result Date: 03/19/2024 CLINICAL DATA:  fever EXAM: PORTABLE CHEST - 1 VIEW COMPARISON:  12/25/2022 FINDINGS: Bibasilar scarring. No focal airspace consolidation, pleural effusion, or pneumothorax. No cardiomegaly. Aortic atherosclerosis. No acute fracture or destructive lesions. Multilevel  thoracic osteophytosis. IMPRESSION: No acute cardiopulmonary abnormality. Electronically Signed   By: Rogelia Myers M.D.   On: 03/19/2024 15:15     Procedures   Medications Ordered in the ED - No data to display  Clinical Course as of 03/19/24 1658  Sat Mar 19, 2024  1637 CT head without acute intracranial abnormality [DR]  1637 Chest x-Cherril Hett without acute cardiopulmonary abnormality [DR]  1637 CBC with leukopenia white count 2200 and platelets low at 143,000, decreasing compared to first prior weeks ago and 3 months ago [DR]  1638 Complete metabolic panel reviewed and interpreted mild hyponatremia [DR]    Clinical Course User Index [DR] Levander Houston, MD  Medical Decision Making Amount and/or Complexity of Data Reviewed Labs: ordered. Radiology: ordered.   73 year old man presents today complaining of ongoing increasing body aches, cough, rib cage pain.  He is concerned for long COVID.  He has been seen by his primary care doctor and evaluated.  He states that symptoms are just worse this weekend and he needed to have further evaluation. Patient is seen here and evaluated with vital signs which are normal Patient has physical exam which is normal. Patient evaluated with head CT due to ongoing headache.  No evidence of acute abnormality is noted on his head CT Patient is evaluated with a complete metabolic panel.  He has mild hyponatremia with sodium of 134.  This is decreased from one 40 3 weeks ago.  He will need further evaluation and have this rechecked in his primary care office CBC was reviewed and interpreted as significant for leukopenia with white count of 2200 and platelets decreased at 143,000.  These were compared to recent CBCs and are decreasing.  Patient is being referred to hematology for follow-up. He currently shows no signs of bleeding.  He does not appear to have any acute infection at this time. Respiratory panel is reviewed and  interpreted no evidence of acute abnormality is noted EKG was reviewed interpreted no evidence of acute ischemia is noted Chest x-Manal Kreutzer reviewed interpreted and normal      Final diagnoses:  Body aches  Chronic nonintractable headache, unspecified headache type  Leukopenia, unspecified type  Thrombocytopenia    ED Discharge Orders          Ordered    prochlorperazine  (COMPAZINE ) 10 MG tablet  2 times daily PRN        03/19/24 1657               Levander Houston, MD 03/19/24 1658

## 2024-03-19 NOTE — Discharge Instructions (Addendum)
 Please call Dr.Kale's office on Monday for follow-up. Please call your primary care doctor to recheck your sodium. Return if you are having any new or worsening symptoms

## 2024-03-30 ENCOUNTER — Ambulatory Visit: Admitting: Nurse Practitioner

## 2024-03-30 VITALS — BP 112/72 | HR 69 | Temp 97.4°F | Ht 72.0 in | Wt 162.2 lb

## 2024-03-30 DIAGNOSIS — D72819 Decreased white blood cell count, unspecified: Secondary | ICD-10-CM

## 2024-03-30 DIAGNOSIS — R7989 Other specified abnormal findings of blood chemistry: Secondary | ICD-10-CM | POA: Diagnosis not present

## 2024-03-30 DIAGNOSIS — Z09 Encounter for follow-up examination after completed treatment for conditions other than malignant neoplasm: Secondary | ICD-10-CM

## 2024-03-30 DIAGNOSIS — E871 Hypo-osmolality and hyponatremia: Secondary | ICD-10-CM | POA: Diagnosis not present

## 2024-03-30 LAB — CBC WITH DIFFERENTIAL/PLATELET
Basophils Absolute: 0 K/uL (ref 0.0–0.1)
Basophils Relative: 0.6 % (ref 0.0–3.0)
Eosinophils Absolute: 0.1 K/uL (ref 0.0–0.7)
Eosinophils Relative: 2.2 % (ref 0.0–5.0)
HCT: 46.4 % (ref 39.0–52.0)
Hemoglobin: 16 g/dL (ref 13.0–17.0)
Lymphocytes Relative: 28.7 % (ref 12.0–46.0)
Lymphs Abs: 1.1 K/uL (ref 0.7–4.0)
MCHC: 34.6 g/dL (ref 30.0–36.0)
MCV: 88 fl (ref 78.0–100.0)
Monocytes Absolute: 0.3 K/uL (ref 0.1–1.0)
Monocytes Relative: 8.4 % (ref 3.0–12.0)
Neutro Abs: 2.4 K/uL (ref 1.4–7.7)
Neutrophils Relative %: 60.1 % (ref 43.0–77.0)
Platelets: 253 K/uL (ref 150.0–400.0)
RBC: 5.27 Mil/uL (ref 4.22–5.81)
RDW: 13.4 % (ref 11.5–15.5)
WBC: 4 K/uL (ref 4.0–10.5)

## 2024-03-30 LAB — COMPREHENSIVE METABOLIC PANEL WITH GFR
ALT: 17 U/L (ref 0–53)
AST: 19 U/L (ref 0–37)
Albumin: 4.6 g/dL (ref 3.5–5.2)
Alkaline Phosphatase: 50 U/L (ref 39–117)
BUN: 13 mg/dL (ref 6–23)
CO2: 30 meq/L (ref 19–32)
Calcium: 9.4 mg/dL (ref 8.4–10.5)
Chloride: 102 meq/L (ref 96–112)
Creatinine, Ser: 1.25 mg/dL (ref 0.40–1.50)
GFR: 57.6 mL/min — ABNORMAL LOW (ref >60.00)
Glucose, Bld: 89 mg/dL (ref 70–99)
Potassium: 4.5 meq/L (ref 3.5–5.1)
Sodium: 139 meq/L (ref 135–145)
Total Bilirubin: 1.1 mg/dL (ref 0.2–1.2)
Total Protein: 7.3 g/dL (ref 6.0–8.3)

## 2024-03-30 NOTE — Patient Instructions (Signed)
Nice to see you today I will be in touch with the labs once I have them Follow up with me in 6 months, sooner if you need me 

## 2024-03-30 NOTE — Progress Notes (Signed)
 Established Patient Office Visit  Subjective   Patient ID: Brian Matthews, male    DOB: 05/02/1951  Age: 73 y.o. MRN: [id number]  Chief Complaint  Patient presents with   Hospitalization Follow-up    Pt complains that symptoms are getting better. Settled down states he is worried about them coming back.     HPI  Discussed the use of AI scribe software for clinical note transcription with the patient, who gave verbal consent to proceed.  History of Present Illness Brian Matthews is a 73 year old male who presents with fluctuating symptoms including headaches and abdominal discomfort.  He has been experiencing intermittent symptoms with periods of wellness followed by a return of symptoms. Currently, his symptoms are mild and barely noticeable.  He was hospitalized on October 4th for a constellation of symptoms and underwent a CT scan of the head, chest x-ray, and blood work. The CT scan and chest x-ray were normal, but blood work showed a low white blood cell count, low sodium (134), low protein, and an abnormal liver function test. His kidney function was normal.  His headaches have mostly resolved, with only occasional mild sensations that are almost nonexistent. He cannot distinguish if they occur more at night or during the day.  His abdominal symptoms have also calmed down, and he is almost feeling himself after experiencing issues for about five months since June.  He maintains a low-carb diet, rarely eating bread or potatoes, and prefers salads and kale. He discusses his glucose and A1c levels, noting that his A1c was 5.6. He exercises regularly.      Review of Systems  Constitutional:  Negative for chills and fever.  Respiratory:  Negative for shortness of breath.   Cardiovascular:  Negative for chest pain.  Neurological:  Positive for headaches.      Objective:     BP 112/72   Pulse 69   Temp (!) 97.4 F (36.3 C) (Oral)   Ht 6' (1.829 m)   Wt  162 lb 3.2 oz (73.6 kg)   SpO2 98%   BMI 22.00 kg/m  BP Readings from Last 3 Encounters:  03/30/24 112/72  03/19/24 124/79  02/23/24 136/80   Wt Readings from Last 3 Encounters:  03/30/24 162 lb 3.2 oz (73.6 kg)  03/19/24 166 lb (75.3 kg)  02/23/24 168 lb 9.6 oz (76.5 kg)   SpO2 Readings from Last 3 Encounters:  03/30/24 98%  03/19/24 99%  02/23/24 99%      Physical Exam Vitals and nursing note reviewed.  Constitutional:      Appearance: Normal appearance.  Cardiovascular:     Rate and Rhythm: Normal rate and regular rhythm.     Heart sounds: Normal heart sounds.  Pulmonary:     Effort: Pulmonary effort is normal.     Breath sounds: Normal breath sounds.  Abdominal:     General: Bowel sounds are normal.  Neurological:     Mental Status: He is alert.      No results found for any visits on 03/30/24.    The 10-year ASCVD risk score (Arnett DK, et al., 2019) is: 16.8%    Assessment & Plan:   Problem List Items Addressed This Visit       Other   Hospital discharge follow-up   Relevant Orders   CBC with Differential/Platelet   Comprehensive metabolic panel with GFR   Ambulatory referral to Hematology / Oncology   Other Visit Diagnoses  Elevated liver function tests    -  Primary   Relevant Orders   Comprehensive metabolic panel with GFR     Hyponatremia       Relevant Orders   Comprehensive metabolic panel with GFR     Leukopenia, unspecified type       Relevant Orders   CBC with Differential/Platelet   Ambulatory referral to Hematology / Oncology      Assessment and Plan Assessment & Plan Hospital follow up with leukopenia, hyponatremia, and abnormal liver function tests Leukopenia, hyponatremia, and abnormal liver function tests noted. Normal CT and chest x-ray. Low WBC possibly due to viral infection. Sodium at 134, one elevated liver function test. Normal kidney function. Hematology referral pending. - Recheck CBC, sodium, and liver  function tests. - Refer to hematologist Dr. Onesimo. - Reviewed imaging, note, lab work from emergency department visit  Frequent headaches Headaches improved, now mild and infrequent, not affecting daily activities.  Glucose and A1c levels A1c at 5.6, below prediabetic threshold. Insulin levels normal. Adheres to low-carb diet and regular exercise. - Monitor A1c annually.   Return in about 6 months (around 09/28/2024) for CPE and Labs.    Brian Crandall, NP

## 2024-03-31 ENCOUNTER — Encounter: Payer: Self-pay | Admitting: Nurse Practitioner

## 2024-04-01 ENCOUNTER — Ambulatory Visit: Payer: Self-pay | Admitting: Nurse Practitioner

## 2024-04-29 ENCOUNTER — Other Ambulatory Visit: Payer: Self-pay | Admitting: Nurse Practitioner

## 2024-04-29 DIAGNOSIS — I10 Essential (primary) hypertension: Secondary | ICD-10-CM

## 2024-05-23 ENCOUNTER — Encounter: Payer: Self-pay | Admitting: Nurse Practitioner

## 2024-05-27 NOTE — Telephone Encounter (Signed)
 If patient is concerned please set up an office visit

## 2024-09-22 ENCOUNTER — Ambulatory Visit

## 2024-09-22 ENCOUNTER — Other Ambulatory Visit

## 2024-09-23 ENCOUNTER — Encounter: Admitting: Nurse Practitioner

## 2024-09-30 ENCOUNTER — Encounter: Admitting: Nurse Practitioner
